# Patient Record
Sex: Female | Born: 1940 | Race: White | Hispanic: No | Marital: Married | Smoking: Never smoker
Health system: Southern US, Community
[De-identification: ages and names within clinical notes are randomized; demographics above are authoritative.]

## PROBLEM LIST (undated history)

## (undated) DIAGNOSIS — N3281 Overactive bladder: Secondary | ICD-10-CM

## (undated) DIAGNOSIS — E559 Vitamin D deficiency, unspecified: Secondary | ICD-10-CM

## (undated) DIAGNOSIS — M545 Low back pain, unspecified: Secondary | ICD-10-CM

## (undated) DIAGNOSIS — M170 Bilateral primary osteoarthritis of knee: Secondary | ICD-10-CM

## (undated) DIAGNOSIS — G8929 Other chronic pain: Secondary | ICD-10-CM

## (undated) DIAGNOSIS — D509 Iron deficiency anemia, unspecified: Secondary | ICD-10-CM

## (undated) DIAGNOSIS — N189 Chronic kidney disease, unspecified: Secondary | ICD-10-CM

## (undated) DIAGNOSIS — M810 Age-related osteoporosis without current pathological fracture: Secondary | ICD-10-CM

## (undated) DIAGNOSIS — D329 Benign neoplasm of meninges, unspecified: Secondary | ICD-10-CM

## (undated) DIAGNOSIS — E039 Hypothyroidism, unspecified: Secondary | ICD-10-CM

## (undated) DIAGNOSIS — I10 Essential (primary) hypertension: Secondary | ICD-10-CM

## (undated) DIAGNOSIS — E785 Hyperlipidemia, unspecified: Secondary | ICD-10-CM

## (undated) HISTORY — PX: EYE SURGERY: SHX253

## (undated) HISTORY — PX: APPENDECTOMY: SHX54

## (undated) HISTORY — PX: ABDOMINAL HYSTERECTOMY: SHX81

## (undated) HISTORY — PX: OTHER SURGICAL HISTORY: SHX169

## (undated) HISTORY — PX: BACK SURGERY: SHX140

---

## 1998-12-31 ENCOUNTER — Inpatient Hospital Stay (HOSPITAL_COMMUNITY): Admission: RE | Admit: 1998-12-31 | Discharge: 1999-01-04 | Payer: Self-pay | Admitting: Neurosurgery

## 1998-12-31 ENCOUNTER — Encounter: Payer: Self-pay | Admitting: Neurosurgery

## 1999-08-25 ENCOUNTER — Ambulatory Visit (HOSPITAL_COMMUNITY): Admission: RE | Admit: 1999-08-25 | Discharge: 1999-08-25 | Payer: Self-pay | Admitting: Neurosurgery

## 1999-08-25 ENCOUNTER — Encounter: Payer: Self-pay | Admitting: Neurosurgery

## 2004-05-16 ENCOUNTER — Other Ambulatory Visit: Payer: Self-pay

## 2005-06-08 ENCOUNTER — Ambulatory Visit: Payer: Self-pay | Admitting: *Deleted

## 2005-09-14 ENCOUNTER — Ambulatory Visit: Payer: Self-pay | Admitting: Family Medicine

## 2006-02-22 ENCOUNTER — Ambulatory Visit: Payer: Self-pay | Admitting: Unknown Physician Specialty

## 2006-03-30 ENCOUNTER — Ambulatory Visit: Payer: Self-pay | Admitting: Nephrology

## 2006-11-08 ENCOUNTER — Ambulatory Visit: Payer: Self-pay | Admitting: Family Medicine

## 2008-01-02 ENCOUNTER — Ambulatory Visit: Payer: Self-pay | Admitting: Family Medicine

## 2008-05-23 ENCOUNTER — Ambulatory Visit: Payer: Self-pay | Admitting: Internal Medicine

## 2009-01-07 ENCOUNTER — Ambulatory Visit: Payer: Self-pay | Admitting: Family Medicine

## 2010-01-19 ENCOUNTER — Ambulatory Visit: Payer: Self-pay | Admitting: Family Medicine

## 2010-02-17 ENCOUNTER — Ambulatory Visit: Payer: Self-pay | Admitting: Family Medicine

## 2011-06-08 ENCOUNTER — Ambulatory Visit: Payer: Self-pay | Admitting: Unknown Physician Specialty

## 2011-06-10 LAB — PATHOLOGY REPORT

## 2011-09-21 ENCOUNTER — Ambulatory Visit: Payer: Self-pay | Admitting: Internal Medicine

## 2012-03-15 ENCOUNTER — Ambulatory Visit: Payer: Self-pay | Admitting: Internal Medicine

## 2012-03-21 ENCOUNTER — Ambulatory Visit: Payer: Self-pay | Admitting: Internal Medicine

## 2012-03-29 ENCOUNTER — Ambulatory Visit: Payer: Self-pay | Admitting: Otolaryngology

## 2012-03-29 LAB — T4, FREE: Free Thyroxine: 0.67 ng/dL — ABNORMAL LOW (ref 0.76–1.46)

## 2012-03-29 LAB — TSH: Thyroid Stimulating Horm: 2.07 u[IU]/mL

## 2012-04-11 ENCOUNTER — Ambulatory Visit: Payer: Self-pay | Admitting: Otolaryngology

## 2012-05-30 ENCOUNTER — Ambulatory Visit: Payer: Self-pay | Admitting: Otolaryngology

## 2012-05-30 LAB — CALCIUM: Calcium, Total: 9.5 mg/dL (ref 8.5–10.1)

## 2012-06-01 LAB — PATHOLOGY REPORT

## 2013-11-15 ENCOUNTER — Ambulatory Visit: Payer: Self-pay | Admitting: Ophthalmology

## 2014-04-17 ENCOUNTER — Ambulatory Visit: Payer: Self-pay | Admitting: Internal Medicine

## 2014-05-14 ENCOUNTER — Ambulatory Visit: Payer: Self-pay | Admitting: Emergency Medicine

## 2014-07-18 ENCOUNTER — Ambulatory Visit: Payer: Self-pay | Admitting: Ophthalmology

## 2014-12-10 ENCOUNTER — Ambulatory Visit: Payer: Self-pay | Admitting: Family Medicine

## 2015-01-29 NOTE — Op Note (Signed)
PATIENT NAME:  Laura Bishop, Laura Bishop MR#:  [id number] DATE OF BIRTH:  September 29, 1941  DATE OF PROCEDURE:  05/30/2012  PREOPERATIVE DIAGNOSIS: Left thyroid mass.   POSTOPERATIVE DIAGNOSIS: Left thyroid mass.  PROCEDURE: Left hemithyroidectomy.  SURGEON: Margaretha Sheffield, MD  ASSISTANT: Mahlon Gammon, MD   COMPLICATIONS: None.   TOTAL ESTIMATED BLOOD LOSS: 50 mL.   DESCRIPTION OF PROCEDURE: The patient was given general anesthesia by oral endotracheal intubation. An oral endotracheal tube was placed that has monitors on it that can monitor the vocal cord movement throughout the procedure. This was visualized under direct vision to make sure it was placed properly. The patient's neck was extended some using a shoulder roll and the left thyroid mass was seen. As it pushed forward, the skin crease was marked and then the skin was prepped using 7 mL of 1% Xylocaine with epinephrine 1:100,000. The neck area was then prepped and draped in sterile fashion.   An incision was created through the skin and sub-Q to show the strap muscles. These were divided in the midline. The skin flap with platysma was retracted using dural hooks with the strap muscles divided in the midline and the left side was elevated over the top of a large left thyroid mass. The mass itself was about 6 cm in size. The lateral attachments were freed using the Harmonic scalpel. Some of the superior attachments were freed as well with the Harmonic scalpel. Then the inferior attachments were freed with the Harmonic scalpel as well. It was rotated more medially and as it came medially some of the posterior attachments were freed up. Using the Harmonic the lateral thyroid vein was cut with the Harmonic. The gland was then freed up digitally and was able to be rotated some and pulled into the anterior wound. As this was done, the posterior wall ruptured some and some of the insides of the thyroid all came out. The wound was irrigated copiously to make  sure there was nothing that was left there. The entire mass was wrapped with gauze to hold it and make sure nothing further came out. With it rotated several more posterior vessels were found and these were cut with the Harmonic scalpel. The recurrent laryngeal nerve was then found and it was sitting right where it normally sits. The stimulator verified position of it. Vessels overlying it and around it were cut with the Harmonic. The remaining superior attachments were freed up with the Harmonic. There was a superior parathyroid gland that was noted. This was left intact and in the bed. The attachments at [facility] ligament were then freed up and the entire left thyroid gland was then removed. This was all one large lump. This was marked and sent for permanent section. The right side was palpated. There were no specific nodules or abnormalities noted on the right side. The wound was copiously irrigated again multiple different times.   Surgicel was then placed into the thyroid bed and a 10 French TLS drain was placed through a separate stab incision inferiorly in the neck. The strap muscles were then closed in the midline loosely. The platysmal layer was then closed. This was with interrupted 4-0 Vicryls, the dermis was closed with interrupted 4-0 Vicryls, and then the skin edges were held in apposition with a 6-0 nylon suture. The wound was covered with bacitracin, Telfa, and Tegaderm dressing. The drain was taped to the skin. It was placed to continuous low Vacutainer pressure. The patient tolerated the procedure well. She was  awakened and taken to the recovery room in satisfactory condition. There were no operative complications. The nerve stimulated well.  ____________________________ Huey Romans, MD phj:slb D: 05/30/2012 11:26:21 ET T: 05/30/2012 12:16:08 ET JOB#: [id number]  cc: Huey Romans, MD, <Dictator> Huey Romans MD ELECTRONICALLY SIGNED 06/02/2012 8:28

## 2016-07-17 ENCOUNTER — Encounter: Payer: Self-pay | Admitting: *Deleted

## 2016-07-20 ENCOUNTER — Encounter: Payer: Self-pay | Admitting: *Deleted

## 2016-07-20 ENCOUNTER — Ambulatory Visit: Payer: Medicare Other | Admitting: Anesthesiology

## 2016-07-20 ENCOUNTER — Encounter
Admission: RE | Disposition: A | Discharge status: Home / Self Care | Payer: Self-pay | Source: Ambulatory Visit | Attending: Unknown Physician Specialty

## 2016-07-20 ENCOUNTER — Ambulatory Visit
Admission: RE | Admit: 2016-07-20 | Discharge: 2016-07-20 | Disposition: A | Discharge status: Home / Self Care | Payer: Medicare Other | Source: Ambulatory Visit | Attending: Unknown Physician Specialty | Admitting: Unknown Physician Specialty

## 2016-07-20 DIAGNOSIS — Z1211 Encounter for screening for malignant neoplasm of colon: Secondary | ICD-10-CM | POA: Diagnosis not present

## 2016-07-20 DIAGNOSIS — Z7982 Long term (current) use of aspirin: Secondary | ICD-10-CM | POA: Diagnosis not present

## 2016-07-20 DIAGNOSIS — D509 Iron deficiency anemia, unspecified: Secondary | ICD-10-CM | POA: Insufficient documentation

## 2016-07-20 DIAGNOSIS — G4730 Sleep apnea, unspecified: Secondary | ICD-10-CM | POA: Diagnosis not present

## 2016-07-20 DIAGNOSIS — K529 Noninfective gastroenteritis and colitis, unspecified: Secondary | ICD-10-CM | POA: Insufficient documentation

## 2016-07-20 DIAGNOSIS — E039 Hypothyroidism, unspecified: Secondary | ICD-10-CM | POA: Insufficient documentation

## 2016-07-20 DIAGNOSIS — Z8719 Personal history of other diseases of the digestive system: Secondary | ICD-10-CM | POA: Diagnosis not present

## 2016-07-20 DIAGNOSIS — D123 Benign neoplasm of transverse colon: Secondary | ICD-10-CM | POA: Diagnosis not present

## 2016-07-20 DIAGNOSIS — E669 Obesity, unspecified: Secondary | ICD-10-CM | POA: Insufficient documentation

## 2016-07-20 DIAGNOSIS — K210 Gastro-esophageal reflux disease with esophagitis: Secondary | ICD-10-CM | POA: Insufficient documentation

## 2016-07-20 DIAGNOSIS — Z6834 Body mass index (BMI) 34.0-34.9, adult: Secondary | ICD-10-CM | POA: Insufficient documentation

## 2016-07-20 DIAGNOSIS — D122 Benign neoplasm of ascending colon: Secondary | ICD-10-CM | POA: Insufficient documentation

## 2016-07-20 DIAGNOSIS — I10 Essential (primary) hypertension: Secondary | ICD-10-CM | POA: Insufficient documentation

## 2016-07-20 DIAGNOSIS — Z79899 Other long term (current) drug therapy: Secondary | ICD-10-CM | POA: Diagnosis not present

## 2016-07-20 DIAGNOSIS — E785 Hyperlipidemia, unspecified: Secondary | ICD-10-CM | POA: Insufficient documentation

## 2016-07-20 DIAGNOSIS — D125 Benign neoplasm of sigmoid colon: Secondary | ICD-10-CM | POA: Insufficient documentation

## 2016-07-20 DIAGNOSIS — R1310 Dysphagia, unspecified: Secondary | ICD-10-CM | POA: Diagnosis not present

## 2016-07-20 DIAGNOSIS — M170 Bilateral primary osteoarthritis of knee: Secondary | ICD-10-CM | POA: Insufficient documentation

## 2016-07-20 HISTORY — DX: Low back pain, unspecified: M54.50

## 2016-07-20 HISTORY — DX: Vitamin D deficiency, unspecified: E55.9

## 2016-07-20 HISTORY — DX: Essential (primary) hypertension: I10

## 2016-07-20 HISTORY — PX: ESOPHAGOGASTRODUODENOSCOPY (EGD) WITH PROPOFOL: SHX5813

## 2016-07-20 HISTORY — DX: Hyperlipidemia, unspecified: E78.5

## 2016-07-20 HISTORY — DX: Low back pain: M54.5

## 2016-07-20 HISTORY — DX: Hypothyroidism, unspecified: E03.9

## 2016-07-20 HISTORY — DX: Overactive bladder: N32.81

## 2016-07-20 HISTORY — PX: COLONOSCOPY WITH PROPOFOL: SHX5780

## 2016-07-20 HISTORY — DX: Iron deficiency anemia, unspecified: D50.9

## 2016-07-20 HISTORY — DX: Bilateral primary osteoarthritis of knee: M17.0

## 2016-07-20 HISTORY — DX: Other chronic pain: G89.29

## 2016-07-20 HISTORY — DX: Age-related osteoporosis without current pathological fracture: M81.0

## 2016-07-20 SURGERY — ESOPHAGOGASTRODUODENOSCOPY (EGD) WITH PROPOFOL
Anesthesia: General

## 2016-07-20 MED ORDER — KETOROLAC TROMETHAMINE 30 MG/ML IJ SOLN: INTRAMUSCULAR | Status: DC | PRN

## 2016-07-20 MED ORDER — SODIUM CHLORIDE 0.9 % IV SOLN
INTRAVENOUS | Status: DC
  Administered 2016-07-20: 10:00:00 via INTRAVENOUS

## 2016-07-20 MED ORDER — LIDOCAINE HCL (CARDIAC) 20 MG/ML IV SOLN
INTRAVENOUS | Status: DC | PRN
Start: 2016-07-20 — End: 2016-07-20
  Administered 2016-07-20: 30 mg via INTRAVENOUS

## 2016-07-20 MED ORDER — FENTANYL CITRATE (PF) 100 MCG/2ML IJ SOLN
INTRAMUSCULAR | Status: DC | PRN
  Administered 2016-07-20: 50 ug via INTRAVENOUS

## 2016-07-20 MED ORDER — EPHEDRINE SULFATE 50 MG/ML IJ SOLN
INTRAMUSCULAR | Status: DC | PRN
  Administered 2016-07-20: 10 mg via INTRAVENOUS

## 2016-07-20 MED ORDER — PROPOFOL 500 MG/50ML IV EMUL
INTRAVENOUS | Status: DC | PRN
  Administered 2016-07-20: 100 ug/kg/min via INTRAVENOUS

## 2016-07-20 MED ORDER — SODIUM CHLORIDE 0.9 % IV SOLN: INTRAVENOUS | Status: DC

## 2016-07-20 MED ORDER — MIDAZOLAM HCL 2 MG/2ML IJ SOLN
INTRAMUSCULAR | Status: DC | PRN
  Administered 2016-07-20: 1 mg via INTRAVENOUS

## 2016-07-20 NOTE — Anesthesia Preprocedure Evaluation (Signed)
Anesthesia Evaluation  Patient identified by MRN, date of birth, ID band Patient awake    Reviewed: Allergy & Precautions, NPO status , Patient's Chart, lab work & pertinent test results  History of Anesthesia Complications Negative for: history of anesthetic complications  Airway Mallampati: II  TM Distance: >3 FB Neck ROM: Full    Dental  (+) Upper Dentures   Pulmonary neg pulmonary ROS, neg sleep apnea, neg COPD,    breath sounds clear to auscultation- rhonchi (-) wheezing      Cardiovascular hypertension, Pt. on medications (-) CAD and (-) Cardiac Stents  Rhythm:Regular Rate:Normal - Systolic murmurs and - Diastolic murmurs    Neuro/Psych negative psych ROS   GI/Hepatic negative GI ROS, Neg liver ROS,   Endo/Other  neg diabetesHypothyroidism   Renal/GU negative Renal ROS     Musculoskeletal  (+) Arthritis , Osteoarthritis,    Abdominal (+) + obese,   Peds  Hematology  (+) anemia ,   Anesthesia Other Findings Past Medical History: No date: Chronic lower back pain No date: Hyperlipidemia No date: Hypertension No date: Hypothyroidism No date: IDA (iron deficiency anemia) No date: Osteoarthritis of both knees No date: Osteoporosis No date: Overactive bladder No date: Vitamin D deficiency   Reproductive/Obstetrics                            Anesthesia Physical Anesthesia Plan  ASA: III  Anesthesia Plan: General   Post-op Pain Management:    Induction: Intravenous  Airway Management Planned: Natural Airway  Additional Equipment:   Intra-op Plan:   Post-operative Plan:   Informed Consent: I have reviewed the patients History and Physical, chart, labs and discussed the procedure including the risks, benefits and alternatives for the proposed anesthesia with the patient or authorized representative who has indicated his/her understanding and acceptance.   Dental advisory  given  Plan Discussed with: CRNA and Anesthesiologist  Anesthesia Plan Comments:         Anesthesia Quick Evaluation

## 2016-07-20 NOTE — Op Note (Signed)
[facility] Patient Name: Laura Bishop Procedure Date: 07/20/2016 10:42 AM MRN: [id number] Account #: [id number] Date of Birth: 1941/03/18 Admit Type: Outpatient Age: 75 Room: [facility] ENDO ROOM 3 Gender: Female Note Status: Finalized Procedure:            Upper GI endoscopy Indications:          Dysphagia Providers:            Manya Silvas, MD Referring MD:         Christena Flake. Raechel Ache, MD (Referring MD) Medicines:            Propofol per Anesthesia Complications:        No immediate complications. Procedure:            Pre-Anesthesia Assessment:                       - After reviewing the risks and benefits, the patient                        was deemed in satisfactory condition to undergo the                        procedure.                       After obtaining informed consent, the endoscope was                        passed under direct vision. Throughout the procedure,                        the patient's blood pressure, pulse, and oxygen                        saturations were monitored continuously. The Endoscope                        was introduced through the mouth, and advanced to the                        second part of duodenum. The upper GI endoscopy was                        accomplished without difficulty. The patient tolerated                        the procedure well. Findings:      LA Grade A (one or more mucosal breaks less than 5 mm, not extending       between tops of 2 mucosal folds) esophagitis with no bleeding was found       39 cm from the incisors. Biopsies were taken with a cold forceps for       histology. After the exam I passed a guide wire (and removed the scope)       over the wire with dilatation with a 48 F Savary dilator.      The stomach was normal.      The examined duodenum was normal. Impression:           - LA Grade A reflux esophagitis. Rule out Barrett's  esophagus. Biopsied.                      - Normal stomach.                       - Normal examined duodenum. Recommendation:       - soft food for 3 days, eat slowly, chew well, take                        small bites Manya Silvas, MD 07/20/2016 10:56:27 AM This report has been signed electronically. Number of Addenda: 0 Note Initiated On: 07/20/2016 10:42 AM      [facility]

## 2016-07-20 NOTE — Anesthesia Postprocedure Evaluation (Signed)
Anesthesia Post Note  Patient: Laura Bishop  Procedure(s) Performed: Procedure(s) (LRB): ESOPHAGOGASTRODUODENOSCOPY (EGD) WITH PROPOFOL (N/A) COLONOSCOPY WITH PROPOFOL (N/A)  Patient location during evaluation: Endoscopy Anesthesia Type: General Level of consciousness: oriented and awake and alert Pain management: pain level controlled Vital Signs Assessment: post-procedure vital signs reviewed and stable Respiratory status: spontaneous breathing, nonlabored ventilation and respiratory function stable Cardiovascular status: blood pressure returned to baseline and stable Postop Assessment: no signs of nausea or vomiting Anesthetic complications: no    Last Vitals:  Vitals:   07/20/16 1154 07/20/16 1204  BP: (!) 128/42 137/68  Pulse: 63 (!) 55  Resp: 13 19  Temp:      Last Pain:  Vitals:   07/20/16 1144  TempSrc: Tympanic                 [name]

## 2016-07-20 NOTE — Op Note (Signed)
[facility] Patient Name: Laura Bishop Procedure Date: 07/20/2016 10:41 AM MRN: [id number] Account #: [id number] Date of Birth: 1941-06-26 Admit Type: Outpatient Age: 75 Room: [facility] ENDO ROOM 3 Gender: Female Note Status: Finalized Procedure:            Colonoscopy Indications:          High risk colon cancer surveillance: Personal history                        of colonic polyps Providers:            Manya Silvas, MD Referring MD:         Christena Flake. Raechel Ache, MD (Referring MD) Medicines:            Propofol per Anesthesia Complications:        No immediate complications. Procedure:            Pre-Anesthesia Assessment:                       - After reviewing the risks and benefits, the patient                        was deemed in satisfactory condition to undergo the                        procedure.                       After obtaining informed consent, the colonoscope was                        passed under direct vision. Throughout the procedure,                        the patient's blood pressure, pulse, and oxygen                        saturations were monitored continuously. The                        Colonoscope was introduced through the anus and                        advanced to the the cecum, identified by appendiceal                        orifice and ileocecal valve. The colonoscopy was                        performed without difficulty. The patient tolerated the                        procedure well. The quality of the bowel preparation                        was good. Findings:      A few sessile polyps- 8- were found in the cecum. The polyps were       diminutive in size. These polyps were removed with a jumbo cold forceps.       Resection and retrieval were complete.      A  small polyp was found in the ascending colon. The polyp was sessile.       The polyp was removed with a hot snare. Resection and retrieval were   complete.      A diminutive polyp was found in the splenic flexure. The polyp was       sessile. The polyp was removed with a jumbo cold forceps. Resection and       retrieval were complete.      A diminutive polyp was found in the splenic flexure. The polyp was       sessile. The polyp was removed with a cold snare. Resection and       retrieval were complete.      A diminutive polyp was found in the sigmoid colon. The polyp was       sessile. The polyp was removed with a cold snare. Resection and       retrieval were complete.      A diminutive polyp was found in the descending colon. The polyp was       sessile. The polyp was removed with a hot snare. Resection and retrieval       were complete.      The exam was otherwise without abnormality. Impression:           - A few diminutive polyps in the cecum, removed with a                        jumbo cold forceps. Resected and retrieved.                       - One small polyp in the ascending colon, removed with                        a hot snare. Resected and retrieved.                       - One diminutive polyp at the splenic flexure, removed                        with a jumbo cold forceps. Resected and retrieved.                       - One diminutive polyp at the splenic flexure, removed                        with a cold snare. Resected and retrieved.                       - One diminutive polyp in the sigmoid colon, removed                        with a cold snare. Resected and retrieved.                       - One diminutive polyp in the descending colon, removed                        with a hot snare. Resected and retrieved.                       - The examination was  otherwise normal. Recommendation:       - Await pathology results. Manya Silvas, MD 07/20/2016 11:45:54 AM This report has been signed electronically. Number of Addenda: 0 Note Initiated On: 07/20/2016 10:41 AM Scope Withdrawal Time: 0 hours 17 minutes  32 seconds  Total Procedure Duration: 0 hours 39 minutes 4 seconds       [facility]

## 2016-07-20 NOTE — H&P (Signed)
Primary Care Physician:  Ezequiel Kayser, MD Primary Gastroenterologist:  Dr. Vira Agar  Pre-Procedure History & Physical: HPI:  Laura Bishop is a 75 y.o. female is here for an endoscopy and colonoscopy.   Past Medical History:  Diagnosis Date  . Chronic lower back pain   . Hyperlipidemia   . Hypertension   . Hypothyroidism   . IDA (iron deficiency anemia)   . Osteoarthritis of both knees   . Osteoporosis   . Overactive bladder   . Vitamin D deficiency     Past Surgical History:  Procedure Laterality Date  . ABDOMINAL HYSTERECTOMY    . BACK SURGERY      Prior to Admission medications   Medication Sig Start Date End Date Taking? Authorizing Provider  amLODipine (NORVASC) 5 MG tablet Take 5 mg by mouth daily.   Yes Historical Provider, MD  aspirin 81 MG chewable tablet Chew by mouth daily.   Yes Historical Provider, MD  calcium carbonate (OSCAL) 1500 (600 Ca) MG TABS tablet Take by mouth daily with breakfast.   Yes Historical Provider, MD  ferrous sulfate 325 (65 FE) MG EC tablet Take 325 mg by mouth daily.   Yes Historical Provider, MD  levothyroxine (SYNTHROID, LEVOTHROID) 75 MCG tablet Take 75 mcg by mouth daily before breakfast.   Yes Historical Provider, MD  metoprolol tartrate (LOPRESSOR) 25 MG tablet Take 25 mg by mouth 2 (two) times daily.   Yes Historical Provider, MD  Multiple Vitamin (MULTIVITAMIN) tablet Take 1 tablet by mouth daily.   Yes Historical Provider, MD  oxybutynin (DITROPAN) 5 MG tablet Take 5 mg by mouth 2 (two) times daily.   Yes Historical Provider, MD  pravastatin (PRAVACHOL) 80 MG tablet Take 80 mg by mouth daily.   Yes Historical Provider, MD  ranitidine (ZANTAC) 75 MG tablet Take 75 mg by mouth 2 (two) times daily.   Yes Historical Provider, MD    Allergies as of 05/29/2016  . (Not on File)    History reviewed. No pertinent family history.  Social History   Social History  . Marital status: Married    Spouse name: N/A  . Number of  children: N/A  . Years of education: N/A   Occupational History  . Not on file.   Social History Main Topics  . Smoking status: Never Smoker  . Smokeless tobacco: Never Used  . Alcohol use No  . Drug use: No  . Sexual activity: Not on file   Other Topics Concern  . Not on file   Social History Narrative  . No narrative on file    Review of Systems: See HPI, otherwise negative ROS  Physical Exam: BP (!) 161/75   Pulse (!) 48   Temp 97.8 F (36.6 C) (Tympanic)   Resp 18   Ht 5' (1.524 m)   Wt 79.4 kg (175 lb)   SpO2 100%   BMI 34.18 kg/m  General:   Alert,  pleasant and cooperative in NAD Head:  Normocephalic and atraumatic. Neck:  Supple; no masses or thyromegaly. Lungs:  Clear throughout to auscultation.    Heart:  Regular rate and rhythm. Abdomen:  Soft, nontender and nondistended. Normal bowel sounds, without guarding, and without rebound.   Neurologic:  Alert and  oriented x4;  grossly normal neurologically.  Impression/Plan: [name] is here for an endoscopy and colonoscopy to be performed for [facility] colon polyps and dysphagia  Risks, benefits, limitations, and alternatives regarding  endoscopy and colonoscopy have been  reviewed with the patient.  Questions have been answered.  All parties agreeable.   Gaylyn Cheers, MD  07/20/2016, 10:37 AM

## 2016-07-20 NOTE — Transfer of Care (Signed)
Immediate Anesthesia Transfer of Care Note  Patient: Laura Bishop  Procedure(s) Performed: Procedure(s): ESOPHAGOGASTRODUODENOSCOPY (EGD) WITH PROPOFOL (N/A) COLONOSCOPY WITH PROPOFOL (N/A)  Patient Location: PACU  Anesthesia Type:General  Level of Consciousness: awake and sedated  Airway & Oxygen Therapy: Patient Spontanous Breathing and Patient connected to nasal cannula oxygen  Post-op Assessment: Report given to RN and Post -op Vital signs reviewed and stable  Post vital signs: Reviewed and stable  Last Vitals:  Vitals:   07/20/16 1004  BP: (!) 161/75  Pulse: (!) 48  Resp: 18  Temp: 36.6 C    Last Pain:  Vitals:   07/20/16 1004  TempSrc: Tympanic         Complications: No apparent anesthesia complications

## 2016-07-20 NOTE — Anesthesia Procedure Notes (Signed)
Performed by: COOK-MARTIN, Almetta Liddicoat Pre-anesthesia Checklist: Patient identified, Emergency Drugs available, Suction available, Patient being monitored and Timeout performed Patient Re-evaluated:Patient Re-evaluated prior to inductionOxygen Delivery Method: Nasal cannula Preoxygenation: Pre-oxygenation with 100% oxygen Intubation Type: IV induction Airway Equipment and Method: Bite block Placement Confirmation: CO2 detector and positive ETCO2     

## 2016-07-21 ENCOUNTER — Encounter: Payer: Self-pay | Admitting: Unknown Physician Specialty

## 2016-07-22 LAB — SURGICAL PATHOLOGY

## 2016-08-06 ENCOUNTER — Ambulatory Visit
Admission: EM | Admit: 2016-08-06 | Discharge: 2016-08-06 | Disposition: A | Discharge status: Home / Self Care | Payer: Medicare Other | Attending: Family Medicine | Admitting: Family Medicine

## 2016-08-06 DIAGNOSIS — H6011 Cellulitis of right external ear: Secondary | ICD-10-CM | POA: Diagnosis not present

## 2016-08-06 DIAGNOSIS — H9201 Otalgia, right ear: Secondary | ICD-10-CM

## 2016-08-06 MED ORDER — MUPIROCIN 2 % EX OINT: 1.0000 "application " | TOPICAL_OINTMENT | Freq: Three times a day (TID) | CUTANEOUS | 0 refills | Status: DC

## 2016-08-06 MED ORDER — SULFAMETHOXAZOLE-TRIMETHOPRIM 800-160 MG PO TABS: 1.0000 | ORAL_TABLET | Freq: Two times a day (BID) | ORAL | 0 refills | Status: DC

## 2016-08-06 NOTE — ED Triage Notes (Addendum)
Pt c/o of ear pain on the inside and outside of her right ear. She mentions it is hard to hear. Right ear does appear to be swollen and red on the outside and she states it feels funny to touch.

## 2016-08-06 NOTE — ED Provider Notes (Signed)
[facility]    CSN: [id number] Arrival date & time: 08/06/16  1117     History   Chief Complaint Chief Complaint  Patient presents with  . Otalgia    HPI Laura Bishop is a 75 y.o. female.   Patient's here because of swollen right ear. Patient is not the best historian. States got worse yesterday but apparently has been a problem for several days before that. She does not know or cannot remember being bitten by insect. She states that she's had trouble with this ear becoming affect full. She denies any pain inside the ear. She's had neck surgery and goiter removed. She has chronic low back pain hyperlipidemia hypertension and hypothyroidism. She denies any family medical history-significant to today's visit. She's not allergic to any medications and she denies smoking. 2 tobacco using type of illicit drugs.   The history is provided by the patient. No language interpreter was used.  Otalgia  Location:  Right Behind ear:  Redness and swelling Quality:  Aching and shooting Onset quality:  Unable to specify Progression:  Worsening Chronicity:  New Context: not direct blow, not elevation change, not foreign body in ear, not loud noise, not recent URI and not water in ear   Relieved by:  None tried Worsened by:  Nothing Associated symptoms: no abdominal pain, no cough, no ear discharge, no fever, no headaches, no rhinorrhea and no sore throat   Risk factors: no chronic ear infection and no prior ear surgery     Past Medical History:  Diagnosis Date  . Chronic lower back pain   . Hyperlipidemia   . Hypertension   . Hypothyroidism   . IDA (iron deficiency anemia)   . Osteoarthritis of both knees   . Osteoporosis   . Overactive bladder   . Vitamin D deficiency     There are no active problems to display for this patient.   Past Surgical History:  Procedure Laterality Date  . ABDOMINAL HYSTERECTOMY    . BACK SURGERY    . COLONOSCOPY WITH PROPOFOL N/A  07/20/2016   Procedure: COLONOSCOPY WITH PROPOFOL;  Surgeon: Manya Silvas, MD;  Location: [facility] ENDOSCOPY;  Service: Endoscopy;  Laterality: N/A;  . ESOPHAGOGASTRODUODENOSCOPY (EGD) WITH PROPOFOL N/A 07/20/2016   Procedure: ESOPHAGOGASTRODUODENOSCOPY (EGD) WITH PROPOFOL;  Surgeon: Manya Silvas, MD;  Location: [facility] ENDOSCOPY;  Service: Endoscopy;  Laterality: N/A;    OB History    No data available       Home Medications    Prior to Admission medications   Medication Sig Start Date End Date Taking? Authorizing Provider  amLODipine (NORVASC) 5 MG tablet Take 5 mg by mouth daily.   Yes Historical Provider, MD  aspirin 81 MG chewable tablet Chew by mouth daily.   Yes Historical Provider, MD  calcium carbonate (OSCAL) 1500 (600 Ca) MG TABS tablet Take by mouth daily with breakfast.   Yes Historical Provider, MD  ferrous sulfate 325 (65 FE) MG EC tablet Take 325 mg by mouth daily.   Yes Historical Provider, MD  levothyroxine (SYNTHROID, LEVOTHROID) 75 MCG tablet Take 75 mcg by mouth daily before breakfast.   Yes Historical Provider, MD  metoprolol tartrate (LOPRESSOR) 25 MG tablet Take 25 mg by mouth 2 (two) times daily.   Yes Historical Provider, MD  Multiple Vitamin (MULTIVITAMIN) tablet Take 1 tablet by mouth daily.   Yes Historical Provider, MD  oxybutynin (DITROPAN) 5 MG tablet Take 5 mg by mouth 2 (two) times daily.  Yes Historical Provider, MD  pravastatin (PRAVACHOL) 80 MG tablet Take 80 mg by mouth daily.   Yes Historical Provider, MD  ranitidine (ZANTAC) 75 MG tablet Take 75 mg by mouth 2 (two) times daily.   Yes Historical Provider, MD  mupirocin ointment (BACTROBAN) 2 % Apply 1 application topically 3 (three) times daily. 08/06/16   Frederich Cha, MD  sulfamethoxazole-trimethoprim (BACTRIM DS,SEPTRA DS) 800-160 MG tablet Take 1 tablet by mouth 2 (two) times daily. 08/06/16   Frederich Cha, MD    Family History History reviewed. No pertinent family history.  Social  History Social History  Substance Use Topics  . Smoking status: Never Smoker  . Smokeless tobacco: Never Used  . Alcohol use No     Allergies   Estrogens and Nsaids   Review of Systems Review of Systems  Constitutional: Negative for fever.  HENT: Positive for ear pain. Negative for ear discharge, rhinorrhea and sore throat.   Respiratory: Negative for cough.   Gastrointestinal: Negative for abdominal pain.  Neurological: Negative for headaches.  All other systems reviewed and are negative.    Physical Exam Triage Vital Signs ED Triage Vitals  Enc Vitals Group     BP 08/06/16 1135 (!) 144/76     Pulse Rate 08/06/16 1135 (!) 58     Resp 08/06/16 1135 18     Temp 08/06/16 1135 98.2 F (36.8 C)     Temp Source 08/06/16 1135 Oral     SpO2 08/06/16 1135 100 %     Weight 08/06/16 1134 175 lb (79.4 kg)     Height 08/06/16 1134 5' (1.524 m)     Head Circumference --      Peak Flow --      Pain Score 08/06/16 1135 5     Pain Loc --      Pain Edu? --      Excl. in [place]? --    No data found.   Updated Vital Signs BP (!) 144/76 (BP Location: Right Arm)   Pulse (!) 58   Temp 98.2 F (36.8 C) (Oral)   Resp 18   Ht 5' (1.524 m)   Wt 175 lb (79.4 kg)   SpO2 100%   BMI 34.18 kg/m   Visual Acuity Right Eye Distance:   Left Eye Distance:   Bilateral Distance:    Right Eye Near:   Left Eye Near:    Bilateral Near:     Physical Exam  Constitutional: She is oriented to person, place, and time. She appears well-developed and well-nourished. No distress.  HENT:  Head: Normocephalic and atraumatic.  Right Ear: Hearing and tympanic membrane normal. There is tenderness. A foreign body is present. Tympanic membrane is not erythematous.  Left Ear: Hearing, tympanic membrane and external ear normal. A foreign body is present.  Nose: Nose normal.  Mouth/Throat: Uvula is midline and oropharynx is clear and moist. She has dentures. No uvula swelling.  Patient's left ear is  swollen and small break in the skin would be a insect bite is difficult to say  Eyes: Pupils are equal, round, and reactive to light.  Neck: Normal range of motion. Neck supple.  Pulmonary/Chest: Effort normal.  Musculoskeletal: Normal range of motion. She exhibits no edema or deformity.  Neurological: She is alert and oriented to person, place, and time. She has normal reflexes.  Skin: Skin is warm.  Psychiatric: She has a normal mood and affect.  Vitals reviewed.    UC Treatments / Results  Labs (all labs ordered are listed, but only abnormal results are displayed) Labs Reviewed - No data to display  EKG  EKG Interpretation None       Radiology No results found.  Procedures Procedures (including critical care time)  Medications Ordered in UC Medications - No data to display   Initial Impression / Assessment and Plan / UC Course  I have reviewed the triage vital signs and the nursing notes.  Pertinent labs & imaging results that were available during my care of the patient were reviewed by me and considered in my medical decision making (see chart for details).  Clinical Course    Right otitis externa maybe some insect bite. We'll place on Septra DS 1 tablet twice a day and Bactroban ointment to apply to the wound 2-3 times a day. Follow-up PCP in a week if not better  Final Clinical Impressions(s) / UC Diagnoses   Final diagnoses:  Otalgia of right ear  Cellulitis of auricle of right ear    New Prescriptions New Prescriptions   MUPIROCIN OINTMENT (BACTROBAN) 2 %    Apply 1 application topically 3 (three) times daily.   SULFAMETHOXAZOLE-TRIMETHOPRIM (BACTRIM DS,SEPTRA DS) 800-160 MG TABLET    Take 1 tablet by mouth 2 (two) times daily.     Frederich Cha, MD 08/06/16 1248

## 2016-08-09 ENCOUNTER — Telehealth: Payer: Self-pay | Admitting: *Deleted

## 2016-08-09 NOTE — Telephone Encounter (Signed)
Courtesy call back, verified DOB, patient reported feeling better.

## 2016-10-09 ENCOUNTER — Other Ambulatory Visit: Payer: Self-pay | Admitting: Internal Medicine

## 2016-10-09 DIAGNOSIS — Z1231 Encounter for screening mammogram for malignant neoplasm of breast: Secondary | ICD-10-CM

## 2016-11-04 ENCOUNTER — Ambulatory Visit
Admission: RE | Admit: 2016-11-04 | Discharge: 2016-11-04 | Disposition: A | Discharge status: Home / Self Care | Payer: Medicare HMO | Source: Ambulatory Visit | Attending: Internal Medicine | Admitting: Internal Medicine

## 2016-11-04 DIAGNOSIS — Z1231 Encounter for screening mammogram for malignant neoplasm of breast: Secondary | ICD-10-CM | POA: Insufficient documentation

## 2017-02-26 ENCOUNTER — Encounter: Payer: Self-pay | Admitting: *Deleted

## 2017-02-26 ENCOUNTER — Ambulatory Visit
Admission: EM | Admit: 2017-02-26 | Discharge: 2017-02-26 | Disposition: A | Discharge status: Home / Self Care | Payer: Medicare HMO | Attending: Family Medicine | Admitting: Family Medicine

## 2017-02-26 DIAGNOSIS — M5412 Radiculopathy, cervical region: Secondary | ICD-10-CM

## 2017-02-26 MED ORDER — METHYLPREDNISOLONE 4 MG PO TBPK: ORAL_TABLET | ORAL | 0 refills | Status: DC

## 2017-02-26 NOTE — ED Triage Notes (Signed)
Patient started having left upper arm pain 1 week ago. Mechanism of injury unknown. No previous history of left upper arm pain.

## 2017-02-26 NOTE — ED Provider Notes (Signed)
CSN: [id number]     Arrival date & time 02/26/17  20 History   First MD Initiated Contact with Patient 02/26/17 1147     Chief Complaint  Patient presents with  . Arm Pain   (Consider location/radiation/quality/duration/timing/severity/associated sxs/prior Treatment) HPI  This a 76 year old female who presents with left upper arm pain that she noticed one week ago. With further questioning she does remember 2 weeks prior to that of having neck pain that was radiating into her left trapezius muscle. The trapezial muscle continues to be tender. She has not had pain with shoulder range of motion. His had no numbness or tingling into her hand. Does not remember any specific incident which may have precipitated her neck pain. She says that she is not able to take nonsteroidal anti-medication because it does not agree with her. Instead she has been using extra strength Tylenol which does not seem to be helping.        Past Medical History:  Diagnosis Date  . Chronic lower back pain   . Hyperlipidemia   . Hypertension   . Hypothyroidism   . IDA (iron deficiency anemia)   . Osteoarthritis of both knees   . Osteoporosis   . Overactive bladder   . Vitamin D deficiency    Past Surgical History:  Procedure Laterality Date  . ABDOMINAL HYSTERECTOMY    . BACK SURGERY    . COLONOSCOPY WITH PROPOFOL N/A 07/20/2016   Procedure: COLONOSCOPY WITH PROPOFOL;  Surgeon: Manya Silvas, MD;  Location: [facility] ENDOSCOPY;  Service: Endoscopy;  Laterality: N/A;  . ESOPHAGOGASTRODUODENOSCOPY (EGD) WITH PROPOFOL N/A 07/20/2016   Procedure: ESOPHAGOGASTRODUODENOSCOPY (EGD) WITH PROPOFOL;  Surgeon: Manya Silvas, MD;  Location: [facility] ENDOSCOPY;  Service: Endoscopy;  Laterality: N/A;   History reviewed. No pertinent family history. Social History  Substance Use Topics  . Smoking status: Never Smoker  . Smokeless tobacco: Never Used  . Alcohol use No   OB History    No data available     Review of  Systems  Constitutional: Positive for activity change. Negative for appetite change, chills, fatigue and fever.  Musculoskeletal: Positive for neck pain and neck stiffness.  All other systems reviewed and are negative.   Allergies  Estrogens and Nsaids  Home Medications   Prior to Admission medications   Medication Sig Start Date End Date Taking? Authorizing Provider  amLODipine (NORVASC) 5 MG tablet Take 5 mg by mouth daily.   Yes [provider]  aspirin 81 MG chewable tablet Chew by mouth daily.   Yes [provider]  calcium carbonate (OSCAL) 1500 (600 Ca) MG TABS tablet Take by mouth daily with breakfast.   Yes [provider]  ferrous sulfate 325 (65 FE) MG EC tablet Take 325 mg by mouth daily.   Yes [provider]  levothyroxine (SYNTHROID, LEVOTHROID) 75 MCG tablet Take 75 mcg by mouth daily before breakfast.   Yes [provider]  metoprolol tartrate (LOPRESSOR) 25 MG tablet Take 25 mg by mouth 2 (two) times daily.   Yes [provider]  Multiple Vitamin (MULTIVITAMIN) tablet Take 1 tablet by mouth daily.   Yes [provider]  mupirocin ointment (BACTROBAN) 2 % Apply 1 application topically 3 (three) times daily. 08/06/16  Yes Frederich Cha, MD  oxybutynin (DITROPAN) 5 MG tablet Take 5 mg by mouth 2 (two) times daily.   Yes [provider]  pravastatin (PRAVACHOL) 80 MG tablet Take 80 mg by mouth daily.   Yes  [provider]  ranitidine (ZANTAC) 75 MG tablet Take 75 mg by mouth 2 (two) times daily.   Yes [provider]  methylPREDNISolone (MEDROL DOSEPAK) 4 MG TBPK tablet Take per package instructions 02/26/17   Lorin Picket, PA-C  sulfamethoxazole-trimethoprim (BACTRIM DS,SEPTRA DS) 800-160 MG tablet Take 1 tablet by mouth 2 (two) times daily. 08/06/16   Frederich Cha, MD   Meds Ordered and Administered this Visit  Medications - No data to display  BP (!) 152/76 (BP Location: Right  Arm)   Pulse (!) 50   Temp 98.4 F (36.9 C) (Oral)   Resp 16   Ht 5' (1.524 m)   Wt 175 lb (79.4 kg)   SpO2 97%   BMI 34.18 kg/m  No data found.   Physical Exam  Constitutional: She is oriented to person, place, and time. She appears well-developed and well-nourished. No distress.  Eyes: Pupils are equal, round, and reactive to light.  Neck:  Examination of the neck shows a decreased range of motion of the C-spine that discomfort at the extremes of full extension left lateral flexion and extension and rotation bilaterally. She has tenderness and spasm palpable in the left trapezial muscle. Sensation is intact to light touch throughout the upper extremities. Strength is intact to clinical testing. DTRs are 2+ over 4 and symmetrical. Shoulder range of motion is full and comfortable. She has a negative empty can test. She is a negative drop arm test.  Musculoskeletal: Normal range of motion.  Neurological: She is alert and oriented to person, place, and time.  Skin: Skin is warm and dry. She is not diaphoretic.  Psychiatric: She has a normal mood and affect. Her behavior is normal. Judgment and thought content normal.  Nursing note and vitals reviewed.   Urgent Care Course     Procedures (including critical care time)  Labs Review Labs Reviewed - No data to display  Imaging Review No results found.   Visual Acuity Review  Right Eye Distance:   Left Eye Distance:   Bilateral Distance:    Right Eye Near:   Left Eye Near:    Bilateral Near:         MDM   1. Cervical radiculopathy    Discharge Medication List as of 02/26/2017 12:11 PM    START taking these medications   Details  methylPREDNISolone (MEDROL DOSEPAK) 4 MG TBPK tablet Take per package instructions, Normal      Plan: 1. Test/x-ray results and diagnosis reviewed with patient 2. rx as per orders; risks, benefits, potential side effects reviewed with patient 3. Recommend supportive treatment with Rest  and symptom avoidance. Since she is unable to tolerate nonsteroidals I will start her on a Medrol Dosepak. I recommended that she follow-up with her primary care physician next week. She may use heat or ice 20 minutes out of every 2 hours 3-4 times daily. 4. F/u prn if symptoms worsen or don't improve     Lorin Picket, PA-C 02/26/17 1230

## 2017-10-21 ENCOUNTER — Other Ambulatory Visit: Payer: Self-pay | Admitting: Internal Medicine

## 2017-10-21 DIAGNOSIS — Z1231 Encounter for screening mammogram for malignant neoplasm of breast: Secondary | ICD-10-CM

## 2017-11-08 ENCOUNTER — Ambulatory Visit
Admission: RE | Admit: 2017-11-08 | Discharge: 2017-11-08 | Disposition: A | Discharge status: Home / Self Care | Payer: Medicare HMO | Source: Ambulatory Visit | Attending: Internal Medicine | Admitting: Internal Medicine

## 2017-11-08 DIAGNOSIS — Z1231 Encounter for screening mammogram for malignant neoplasm of breast: Secondary | ICD-10-CM | POA: Insufficient documentation

## 2018-05-27 ENCOUNTER — Emergency Department: Payer: Medicare HMO

## 2018-05-27 ENCOUNTER — Encounter: Payer: Self-pay | Admitting: Emergency Medicine

## 2018-05-27 ENCOUNTER — Emergency Department
Admission: EM | Admit: 2018-05-27 | Discharge: 2018-05-27 | Disposition: A | Discharge status: Home / Self Care | Payer: Medicare HMO | Attending: Emergency Medicine | Admitting: Emergency Medicine

## 2018-05-27 ENCOUNTER — Other Ambulatory Visit: Payer: Self-pay

## 2018-05-27 DIAGNOSIS — Z79899 Other long term (current) drug therapy: Secondary | ICD-10-CM | POA: Diagnosis not present

## 2018-05-27 DIAGNOSIS — E039 Hypothyroidism, unspecified: Secondary | ICD-10-CM | POA: Diagnosis not present

## 2018-05-27 DIAGNOSIS — N390 Urinary tract infection, site not specified: Secondary | ICD-10-CM

## 2018-05-27 DIAGNOSIS — R42 Dizziness and giddiness: Secondary | ICD-10-CM | POA: Insufficient documentation

## 2018-05-27 DIAGNOSIS — R55 Syncope and collapse: Secondary | ICD-10-CM

## 2018-05-27 DIAGNOSIS — I10 Essential (primary) hypertension: Secondary | ICD-10-CM | POA: Insufficient documentation

## 2018-05-27 DIAGNOSIS — G930 Cerebral cysts: Secondary | ICD-10-CM

## 2018-05-27 DIAGNOSIS — Z7982 Long term (current) use of aspirin: Secondary | ICD-10-CM | POA: Insufficient documentation

## 2018-05-27 LAB — URINALYSIS, COMPLETE (UACMP) WITH MICROSCOPIC
BACTERIA UA: NONE SEEN
Bilirubin Urine: NEGATIVE
GLUCOSE, UA: NEGATIVE mg/dL
HGB URINE DIPSTICK: NEGATIVE
KETONES UR: 5 mg/dL — AB
Nitrite: NEGATIVE
PROTEIN: NEGATIVE mg/dL
Specific Gravity, Urine: 1.025 (ref 1.005–1.030)
pH: 5 (ref 5.0–8.0)

## 2018-05-27 LAB — BASIC METABOLIC PANEL
ANION GAP: 9 (ref 5–15)
BUN: 23 mg/dL (ref 8–23)
CHLORIDE: 109 mmol/L (ref 98–111)
CO2: 23 mmol/L (ref 22–32)
Calcium: 9.1 mg/dL (ref 8.9–10.3)
Creatinine, Ser: 1.22 mg/dL — ABNORMAL HIGH (ref 0.44–1.00)
GFR calc Af Amer: 49 mL/min — ABNORMAL LOW (ref >60)
GFR calc non Af Amer: 42 mL/min — ABNORMAL LOW (ref >60)
GLUCOSE: 97 mg/dL (ref 70–99)
POTASSIUM: 4.4 mmol/L (ref 3.5–5.1)
Sodium: 141 mmol/L (ref 135–145)

## 2018-05-27 LAB — CBC
HEMATOCRIT: 40.1 % (ref 35.0–47.0)
HEMOGLOBIN: 13.6 g/dL (ref 12.0–16.0)
MCH: 30.1 pg (ref 26.0–34.0)
MCHC: 33.9 g/dL (ref 32.0–36.0)
MCV: 88.7 fL (ref 80.0–100.0)
Platelets: 248 10*3/uL (ref 150–440)
RBC: 4.52 MIL/uL (ref 3.80–5.20)
RDW: 13.4 % (ref 11.5–14.5)
WBC: 6.7 10*3/uL (ref 3.6–11.0)

## 2018-05-27 LAB — TROPONIN I

## 2018-05-27 MED ORDER — ACETAMINOPHEN 325 MG PO TABS
650.0000 mg | ORAL_TABLET | Freq: Once | ORAL | Status: AC
  Administered 2018-05-27: 650 mg via ORAL
  Filled 2018-05-27: qty 2

## 2018-05-27 MED ORDER — CEPHALEXIN 500 MG PO CAPS
500.0000 mg | ORAL_CAPSULE | Freq: Once | ORAL | Status: AC
  Administered 2018-05-27: 500 mg via ORAL
  Filled 2018-05-27: qty 1

## 2018-05-27 MED ORDER — CEPHALEXIN 500 MG PO CAPS: 500.0000 mg | ORAL_CAPSULE | Freq: Two times a day (BID) | ORAL | 0 refills | Status: AC

## 2018-05-27 NOTE — ED Provider Notes (Signed)
[facility] Emergency Department Provider Note ____________________________________________   First MD Initiated Contact with Patient 05/27/18 1256     (approximate)  I have reviewed the triage vital signs and the nursing notes.   HISTORY  Chief Complaint Dizziness   HPI Laura Bishop is a 77 y.o. female  history of several past episodes of lightheadedness was presented to the emergency department lightheadedness today.  Said that she was getting out of her car and walking to a [place] store when she started feeling lightheaded.  She denies any palpitations or chest pain or shortness of breath.  Was able to sit down in the backseat of her car and let the sensation passed after about 10 minutes.  Says that she is a mild frontal headache at this time as well as left-sided thoracic back pain.  Says that she took a Tylenol this morning for her back pain which is chronic and says that she usually takes an afternoon dose of Tylenol as well.  Says that she also has diarrhea about every 3 days but has not had any increased diarrhea at this time.  No nausea or vomiting.   Past Medical History:  Diagnosis Date  . Chronic lower back pain   . Hyperlipidemia   . Hypertension   . Hypothyroidism   . IDA (iron deficiency anemia)   . Osteoarthritis of both knees   . Osteoporosis   . Overactive bladder   . Vitamin D deficiency     There are no active problems to display for this patient.   Past Surgical History:  Procedure Laterality Date  . ABDOMINAL HYSTERECTOMY    . BACK SURGERY    . COLONOSCOPY WITH PROPOFOL N/A 07/20/2016   Procedure: COLONOSCOPY WITH PROPOFOL;  Surgeon: Manya Silvas, MD;  Location: [facility] ENDOSCOPY;  Service: Endoscopy;  Laterality: N/A;  . ESOPHAGOGASTRODUODENOSCOPY (EGD) WITH PROPOFOL N/A 07/20/2016   Procedure: ESOPHAGOGASTRODUODENOSCOPY (EGD) WITH PROPOFOL;  Surgeon: Manya Silvas, MD;  Location: [facility] ENDOSCOPY;  Service: Endoscopy;   Laterality: N/A;    Prior to Admission medications   Medication Sig Start Date End Date Taking? Authorizing Provider  amLODipine (NORVASC) 5 MG tablet Take 10 mg by mouth daily.    Yes [provider]  aspirin 81 MG tablet Take 81 mg by mouth daily.    Yes [provider]  calcium carbonate (OSCAL) 1500 (600 Ca) MG TABS tablet Take 600 mg of elemental calcium by mouth daily with breakfast.    Yes [provider]  ferrous sulfate 325 (65 FE) MG EC tablet Take 325 mg by mouth daily.   Yes [provider]  levothyroxine (SYNTHROID, LEVOTHROID) 75 MCG tablet Take 75 mcg by mouth daily before breakfast.   Yes [provider]  Multiple Vitamin (MULTIVITAMIN) tablet Take 1 tablet by mouth daily.   Yes [provider]  oxybutynin (DITROPAN) 5 MG tablet Take 5 mg by mouth 2 (two) times daily.   Yes [provider]  pravastatin (PRAVACHOL) 80 MG tablet Take 80 mg by mouth at bedtime.    Yes [provider]  ranitidine (ZANTAC) 75 MG tablet Take 75 mg by mouth 2 (two) times daily as needed for heartburn.    Yes [provider]    Allergies Estrogens and Nsaids  No family history on file.  Social History Social History   Tobacco Use  . Smoking status: Never Smoker  . Smokeless tobacco: Never Used  Substance Use Topics  . Alcohol  use: No  . Drug use: No    Review of Systems  Constitutional: No fever/chills Eyes: No visual changes. ENT: No sore throat. Cardiovascular: Denies chest pain. Respiratory: Denies shortness of breath. Gastrointestinal: No abdominal pain.  No nausea, no vomiting.  No diarrhea.  No constipation. Genitourinary: Negative for dysuria. Musculoskeletal: As above Skin: Negative for rash. Neurological: Negative for focal weakness or numbness.   ____________________________________________   PHYSICAL EXAM:  VITAL SIGNS: ED Triage Vitals  Enc Vitals Group     BP 05/27/18 1051 (!)  146/70     Pulse Rate 05/27/18 1051 67     Resp 05/27/18 1051 18     Temp 05/27/18 1051 98.3 F (36.8 C)     Temp Source 05/27/18 1051 Oral     SpO2 05/27/18 1051 95 %     Weight 05/27/18 1058 170 lb (77.1 kg)     Height 05/27/18 1058 5' (1.524 m)     Head Circumference --      Peak Flow --      Pain Score 05/27/18 1057 5     Pain Loc --      Pain Edu? --      Excl. in [place]? --     Constitutional: Alert and oriented. Well appearing and in no acute distress. Eyes: Conjunctivae are normal.  Head: Atraumatic. Nose: No congestion/rhinnorhea. Mouth/Throat: Mucous membranes are moist.  Neck: No stridor.   Cardiovascular: Normal rate, regular rhythm. Grossly normal heart sounds.   Respiratory: Normal respiratory effort.  No retractions. Lungs CTAB. Gastrointestinal: Soft and nontender. No distention. No CVA tenderness. Musculoskeletal: No lower extremity tenderness nor edema.  No joint effusions.  Mild left thoracic ttp.  Pt says that this is wear she has her chronic back pain.   Neurologic:  Normal speech and language. No gross focal neurologic deficits are appreciated. Skin:  Skin is warm, dry and intact. No rash noted. Psychiatric: Mood and affect are normal. Speech and behavior are normal.  ____________________________________________   LABS (all labs ordered are listed, but only abnormal results are displayed)  Labs Reviewed  BASIC METABOLIC PANEL - Abnormal; Notable for the following components:      Result Value   Creatinine, Ser 1.22 (*)    GFR calc non Af Amer 42 (*)    GFR calc Af Amer 49 (*)    All other components within normal limits  URINALYSIS, COMPLETE (UACMP) WITH MICROSCOPIC - Abnormal; Notable for the following components:   Color, Urine YELLOW (*)    APPearance CLEAR (*)    Ketones, ur 5 (*)    Leukocytes, UA MODERATE (*)    All other components within normal limits  URINE CULTURE  CBC  TROPONIN I  CBG MONITORING, ED    ____________________________________________  EKG  ED ECG REPORT I, Doran Stabler, the attending physician, personally viewed and interpreted this ECG.   Date: 05/27/2018  EKG Time: 1107  Rate: 71  Rhythm: normal sinus rhythm  Axis: Normal  Intervals:none  ST&T Change: No ST segment elevation or depression.  No abnormal T wave inversion.  ____________________________________________  RADIOLOGY  No acute abnormality.  Moderately large fluid-filled space posterior to the cerebellum.  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 ____________________________________________   PROCEDURES  Procedure(s) performed:   Procedures  Critical Care performed:   ____________________________________________   INITIAL IMPRESSION / ASSESSMENT AND PLAN / ED COURSE  Pertinent labs & imaging results that were available during my care of the patient were reviewed by  me and considered in my medical decision making (see chart for details).  DDX: Electrolyte abnormality, dehydration, UTI, ACS, stroke As part of my medical decision making, I reviewed the following data within the [place] Notes from prior ED visits  ----------------------------------------- 4:21 PM on 05/27/2018 -----------------------------------------  Patient with moderate leukocytes as well as 21-50 WBCs.  Pain-free and asymptomatic at this time.  Reassuring blood work several hours out from the incident.  Reassuring EKG.  Discussed the finding of the likely cyst on the CT scan with Dr. Doy Mince of neurology recommends outpatient follow-up.  Unclear and probably unlikely that this would be causing the patient's symptoms.  She will also follow-up with cardiology.  He is understanding of the treatment plan and willing to comply.  We also discussed the diagnoses including the CT results. ____________________________________________   FINAL CLINICAL IMPRESSION(S) / ED DIAGNOSES  Near-syncope.  UTI.  Arachnoid  cyst.  NEW MEDICATIONS STARTED DURING THIS VISIT:  New Prescriptions   No medications on file     Note:  This document was prepared using Dragon voice recognition software and may include unintentional dictation errors.     Orbie Pyo, MD 05/27/18 [phone]

## 2018-05-27 NOTE — ED Notes (Signed)
Pt to and from CT. ABCs intact. NAD. 

## 2018-05-27 NOTE — ED Triage Notes (Signed)
States was getting out of the car today at around 1000 and felt dizzy and nauseated.  States dizziness has improved but now also c/o headache across her forehead.   AAOx3.  Skin warm and dry.  MAE equally and strong.  Speech clear.  Facial movements equal.  Sensation wnl.  NAD

## 2018-05-27 NOTE — ED Notes (Signed)
Pt assisted to and from bathroom. ABCs intact. NAD.

## 2018-05-28 LAB — URINE CULTURE: CULTURE: NO GROWTH

## 2018-06-07 ENCOUNTER — Other Ambulatory Visit: Payer: Self-pay | Admitting: Acute Care

## 2018-06-07 DIAGNOSIS — R42 Dizziness and giddiness: Secondary | ICD-10-CM

## 2018-06-22 ENCOUNTER — Ambulatory Visit
Admission: RE | Admit: 2018-06-22 | Discharge: 2018-06-22 | Disposition: A | Discharge status: Home / Self Care | Payer: Medicare HMO | Source: Ambulatory Visit | Attending: Acute Care | Admitting: Acute Care

## 2018-06-22 DIAGNOSIS — G930 Cerebral cysts: Secondary | ICD-10-CM | POA: Insufficient documentation

## 2018-06-22 DIAGNOSIS — R42 Dizziness and giddiness: Secondary | ICD-10-CM

## 2018-06-22 DIAGNOSIS — R9082 White matter disease, unspecified: Secondary | ICD-10-CM | POA: Diagnosis not present

## 2018-11-28 ENCOUNTER — Other Ambulatory Visit: Payer: Self-pay

## 2018-11-28 ENCOUNTER — Encounter: Payer: Self-pay | Admitting: Emergency Medicine

## 2018-11-28 ENCOUNTER — Ambulatory Visit
Admission: EM | Admit: 2018-11-28 | Discharge: 2018-11-28 | Disposition: A | Discharge status: Home / Self Care | Payer: Medicare HMO | Attending: Family Medicine | Admitting: Family Medicine

## 2018-11-28 DIAGNOSIS — B9789 Other viral agents as the cause of diseases classified elsewhere: Secondary | ICD-10-CM

## 2018-11-28 DIAGNOSIS — R05 Cough: Secondary | ICD-10-CM

## 2018-11-28 DIAGNOSIS — J029 Acute pharyngitis, unspecified: Secondary | ICD-10-CM

## 2018-11-28 DIAGNOSIS — R5383 Other fatigue: Secondary | ICD-10-CM | POA: Diagnosis not present

## 2018-11-28 DIAGNOSIS — J069 Acute upper respiratory infection, unspecified: Secondary | ICD-10-CM | POA: Diagnosis not present

## 2018-11-28 LAB — RAPID INFLUENZA A&B ANTIGENS
Influenza A (ARMC): NEGATIVE
Influenza B (ARMC): NEGATIVE

## 2018-11-28 MED ORDER — BENZONATATE 200 MG PO CAPS: 200.0000 mg | ORAL_CAPSULE | Freq: Three times a day (TID) | ORAL | 0 refills | Status: DC | PRN

## 2018-11-28 NOTE — ED Triage Notes (Signed)
Patient c/o cough and headache that started 2 days ago. Patient denies fever but does report chills.

## 2018-11-28 NOTE — ED Provider Notes (Signed)
[facility]    CSN: [id number] Arrival date & time: 11/28/18  9767     History   Chief Complaint Chief Complaint  Patient presents with  . Cough    HPI Laura Bishop is a 78 y.o. female.   The history is provided by the patient.  Cough  Associated symptoms: sore throat   URI  Presenting symptoms: cough, fatigue and sore throat   Severity:  Moderate Onset quality:  Sudden Duration:  2 days Timing:  Constant Progression:  Unchanged Chronicity:  New Relieved by:  None tried Ineffective treatments:  None tried Risk factors: being elderly   Risk factors: no sick contacts     Past Medical History:  Diagnosis Date  . Chronic lower back pain   . Hyperlipidemia   . Hypertension   . Hypothyroidism   . IDA (iron deficiency anemia)   . Osteoarthritis of both knees   . Osteoporosis   . Overactive bladder   . Vitamin D deficiency     There are no active problems to display for this patient.   Past Surgical History:  Procedure Laterality Date  . ABDOMINAL HYSTERECTOMY    . BACK SURGERY    . COLONOSCOPY WITH PROPOFOL N/A 07/20/2016   Procedure: COLONOSCOPY WITH PROPOFOL;  Surgeon: Manya Silvas, MD;  Location: [facility]COPY;  Service: Endoscopy;  Laterality: N/A;  . ESOPHAGOGASTRODUODENOSCOPY (EGD) WITH PROPOFOL N/A 07/20/2016   Procedure: ESOPHAGOGASTRODUODENOSCOPY (EGD) WITH PROPOFOL;  Surgeon: Manya Silvas, MD;  Location: [facility] ENDOSCOPY;  Service: Endoscopy;  Laterality: N/A;    OB History   No obstetric history on file.      Home Medications    Prior to Admission medications   Medication Sig Start Date End Date Taking? Authorizing Provider  amLODipine (NORVASC) 5 MG tablet Take 10 mg by mouth daily.    Yes [provider]  aspirin 81 MG tablet Take 81 mg by mouth daily.    Yes [provider]  calcium carbonate (OSCAL) 1500 (600 Ca) MG TABS tablet Take 600 mg of elemental calcium by mouth daily with breakfast.    Yes  [provider]  ferrous sulfate 325 (65 FE) MG EC tablet Take 325 mg by mouth daily.   Yes [provider]  levothyroxine (SYNTHROID, LEVOTHROID) 75 MCG tablet Take 75 mcg by mouth daily before breakfast.   Yes [provider]  Multiple Vitamin (MULTIVITAMIN) tablet Take 1 tablet by mouth daily.   Yes [provider]  oxybutynin (DITROPAN) 5 MG tablet Take 5 mg by mouth 2 (two) times daily.   Yes [provider]  pravastatin (PRAVACHOL) 80 MG tablet Take 80 mg by mouth at bedtime.    Yes [provider]  ranitidine (ZANTAC) 75 MG tablet Take 75 mg by mouth 2 (two) times daily as needed for heartburn.    Yes [provider]  benzonatate (TESSALON) 200 MG capsule Take 1 capsule (200 mg total) by mouth 3 (three) times daily as needed. 11/28/18   Norval Gable, MD    Family History History reviewed. No pertinent family history.  Social History Social History   Tobacco Use  . Smoking status: Never Smoker  . Smokeless tobacco: Never Used  Substance Use Topics  . Alcohol use: No  . Drug use: No     Allergies   Estrogens and Nsaids   Review of Systems Review of Systems  Constitutional: Positive for fatigue.  HENT: Positive for sore throat.   Respiratory:  Positive for cough.      Physical Exam Triage Vital Signs ED Triage Vitals  Enc Vitals Group     BP 11/28/18 1022 136/84     Pulse Rate 11/28/18 1022 63     Resp 11/28/18 1022 18     Temp 11/28/18 1022 98.1 F (36.7 C)     Temp Source 11/28/18 1022 Oral     SpO2 11/28/18 1022 99 %     Weight 11/28/18 1021 176 lb (79.8 kg)     Height 11/28/18 1021 5' (1.524 m)     Head Circumference --      Peak Flow --      Pain Score 11/28/18 1021 0     Pain Loc --      Pain Edu? --      Excl. in [place]? --    No data found.  Updated Vital Signs BP 136/84 (BP Location: Right Arm)   Pulse 63   Temp 98.1 F (36.7 C) (Oral)   Resp 18   Ht 5' (1.524 m)   Wt 79.8 kg    SpO2 99%   BMI 34.37 kg/m   Visual Acuity Right Eye Distance:   Left Eye Distance:   Bilateral Distance:    Right Eye Near:   Left Eye Near:    Bilateral Near:     Physical Exam Vitals signs and nursing note reviewed.  Constitutional:      General: She is not in acute distress.    Appearance: She is well-developed. She is not toxic-appearing or diaphoretic.  HENT:     Head: Normocephalic and atraumatic.     Right Ear: Tympanic membrane, ear canal and external ear normal.     Left Ear: Tympanic membrane, ear canal and external ear normal.     Mouth/Throat:     Pharynx: Uvula midline. No oropharyngeal exudate.  Eyes:     General: No scleral icterus.       Right eye: No discharge.        Left eye: No discharge.  Neck:     Musculoskeletal: Normal range of motion and neck supple.     Thyroid: No thyromegaly.  Cardiovascular:     Rate and Rhythm: Normal rate and regular rhythm.     Heart sounds: Normal heart sounds.  Pulmonary:     Effort: Pulmonary effort is normal. No respiratory distress.     Breath sounds: Normal breath sounds. No stridor. No wheezing, rhonchi or rales.  Lymphadenopathy:     Cervical: No cervical adenopathy.  Neurological:     Mental Status: She is alert.      UC Treatments / Results  Labs (all labs ordered are listed, but only abnormal results are displayed) Labs Reviewed  RAPID INFLUENZA A&B ANTIGENS ([place])    EKG None  Radiology No results found.  Procedures Procedures (including critical care time)  Medications Ordered in UC Medications - No data to display  Initial Impression / Assessment and Plan / UC Course  I have reviewed the triage vital signs and the nursing notes.  Pertinent labs & imaging results that were available during my care of the patient were reviewed by me and considered in my medical decision making (see chart for details).      Final Clinical Impressions(s) / UC Diagnoses   Final diagnoses:  Viral  URI with cough    ED Prescriptions    Medication Sig Dispense Auth. Provider   benzonatate (TESSALON) 200 MG capsule Take  1 capsule (200 mg total) by mouth 3 (three) times daily as needed. 30 capsule Norval Gable, MD     1. Lab results and diagnosis reviewed with patient 2. rx as per orders above; reviewed possible side effects, interactions, risks and benefits  3. Recommend supportive treatment with fluids, tylenol/advil as needed 4. Follow-up prn if symptoms worsen or don't improve   Controlled Substance Prescriptions [place] Controlled Substance Registry consulted? Not Applicable   Norval Gable, MD 11/28/18 1119

## 2019-01-11 ENCOUNTER — Other Ambulatory Visit
Admission: RE | Admit: 2019-01-11 | Discharge: 2019-01-11 | Disposition: A | Discharge status: Home / Self Care | Payer: Medicare HMO | Source: Ambulatory Visit | Attending: Student | Admitting: Student

## 2019-01-11 DIAGNOSIS — K529 Noninfective gastroenteritis and colitis, unspecified: Secondary | ICD-10-CM | POA: Diagnosis present

## 2019-01-11 LAB — GASTROINTESTINAL PANEL BY PCR, STOOL (REPLACES STOOL CULTURE)

## 2019-01-11 LAB — C DIFFICILE QUICK SCREEN W PCR REFLEX
C Diff antigen: NEGATIVE
C Diff interpretation: NOT DETECTED

## 2019-01-11 LAB — C DIFFICILE QUICK SCREEN W PCR REFLEX??: C Diff toxin: NEGATIVE

## 2019-01-13 LAB — CALPROTECTIN, FECAL: Calprotectin, Fecal: 76 ug/g (ref 0–120)

## 2019-01-13 LAB — PANCREATIC ELASTASE, FECAL: Pancreatic Elastase-1, Stool: >500 ug Elast./g (ref >200)

## 2019-02-10 ENCOUNTER — Other Ambulatory Visit: Payer: Self-pay | Admitting: Internal Medicine

## 2019-02-10 DIAGNOSIS — N644 Mastodynia: Secondary | ICD-10-CM

## 2019-02-10 DIAGNOSIS — Z1231 Encounter for screening mammogram for malignant neoplasm of breast: Secondary | ICD-10-CM

## 2019-02-20 ENCOUNTER — Ambulatory Visit
Admission: RE | Admit: 2019-02-20 | Discharge: 2019-02-20 | Disposition: A | Discharge status: Home / Self Care | Payer: Medicare HMO | Source: Ambulatory Visit | Attending: Internal Medicine | Admitting: Internal Medicine

## 2019-02-20 ENCOUNTER — Other Ambulatory Visit: Payer: Self-pay

## 2019-02-20 DIAGNOSIS — N644 Mastodynia: Secondary | ICD-10-CM

## 2019-02-22 ENCOUNTER — Other Ambulatory Visit
Admission: RE | Admit: 2019-02-22 | Discharge: 2019-02-22 | Disposition: A | Discharge status: Home / Self Care | Payer: Medicare HMO | Source: Ambulatory Visit | Attending: Unknown Physician Specialty | Admitting: Unknown Physician Specialty

## 2019-02-22 ENCOUNTER — Other Ambulatory Visit: Payer: Self-pay

## 2019-02-22 DIAGNOSIS — Z1159 Encounter for screening for other viral diseases: Secondary | ICD-10-CM | POA: Diagnosis present

## 2019-02-23 LAB — NOVEL CORONAVIRUS, NAA (HOSP ORDER, SEND-OUT TO REF LAB; TAT 18-24 HRS): SARS-CoV-2, NAA: NOT DETECTED

## 2019-02-24 ENCOUNTER — Encounter: Payer: Self-pay | Admitting: Anesthesiology

## 2019-02-27 ENCOUNTER — Encounter
Admission: RE | Disposition: A | Discharge status: Home / Self Care | Payer: Self-pay | Source: Home / Self Care | Attending: Unknown Physician Specialty

## 2019-02-27 ENCOUNTER — Encounter: Payer: Self-pay | Admitting: *Deleted

## 2019-02-27 ENCOUNTER — Other Ambulatory Visit: Payer: Self-pay

## 2019-02-27 ENCOUNTER — Ambulatory Visit
Admission: RE | Admit: 2019-02-27 | Discharge: 2019-02-27 | Disposition: A | Discharge status: Home / Self Care | Payer: Medicare HMO | Attending: Unknown Physician Specialty | Admitting: Unknown Physician Specialty

## 2019-02-27 ENCOUNTER — Ambulatory Visit: Payer: Medicare HMO | Admitting: Anesthesiology

## 2019-02-27 DIAGNOSIS — Z86010 Personal history of colonic polyps: Secondary | ICD-10-CM | POA: Insufficient documentation

## 2019-02-27 DIAGNOSIS — Z7982 Long term (current) use of aspirin: Secondary | ICD-10-CM | POA: Insufficient documentation

## 2019-02-27 DIAGNOSIS — E785 Hyperlipidemia, unspecified: Secondary | ICD-10-CM | POA: Diagnosis not present

## 2019-02-27 DIAGNOSIS — K635 Polyp of colon: Secondary | ICD-10-CM | POA: Diagnosis not present

## 2019-02-27 DIAGNOSIS — D509 Iron deficiency anemia, unspecified: Secondary | ICD-10-CM | POA: Diagnosis not present

## 2019-02-27 DIAGNOSIS — K640 First degree hemorrhoids: Secondary | ICD-10-CM | POA: Insufficient documentation

## 2019-02-27 DIAGNOSIS — E039 Hypothyroidism, unspecified: Secondary | ICD-10-CM | POA: Insufficient documentation

## 2019-02-27 DIAGNOSIS — Z09 Encounter for follow-up examination after completed treatment for conditions other than malignant neoplasm: Secondary | ICD-10-CM | POA: Insufficient documentation

## 2019-02-27 DIAGNOSIS — D122 Benign neoplasm of ascending colon: Secondary | ICD-10-CM | POA: Diagnosis not present

## 2019-02-27 DIAGNOSIS — K529 Noninfective gastroenteritis and colitis, unspecified: Secondary | ICD-10-CM | POA: Diagnosis not present

## 2019-02-27 DIAGNOSIS — Z79899 Other long term (current) drug therapy: Secondary | ICD-10-CM | POA: Diagnosis not present

## 2019-02-27 DIAGNOSIS — I10 Essential (primary) hypertension: Secondary | ICD-10-CM | POA: Diagnosis not present

## 2019-02-27 DIAGNOSIS — K5730 Diverticulosis of large intestine without perforation or abscess without bleeding: Secondary | ICD-10-CM | POA: Diagnosis not present

## 2019-02-27 HISTORY — PX: COLONOSCOPY WITH PROPOFOL: SHX5780

## 2019-02-27 SURGERY — COLONOSCOPY WITH PROPOFOL
Anesthesia: General

## 2019-02-27 MED ORDER — PROPOFOL 10 MG/ML IV BOLUS
INTRAVENOUS | Status: DC | PRN
  Administered 2019-02-27: 10 mg via INTRAVENOUS
  Administered 2019-02-27: 40 mg via INTRAVENOUS
  Administered 2019-02-27: 10 mg via INTRAVENOUS

## 2019-02-27 MED ORDER — PROPOFOL 500 MG/50ML IV EMUL
INTRAVENOUS | Status: DC | PRN
  Administered 2019-02-27: 125 ug/kg/min via INTRAVENOUS

## 2019-02-27 MED ORDER — PROPOFOL 500 MG/50ML IV EMUL
INTRAVENOUS | Status: AC
  Filled 2019-02-27: qty 50

## 2019-02-27 MED ORDER — SODIUM CHLORIDE 0.9 % IV SOLN
INTRAVENOUS | Status: DC
  Administered 2019-02-27: 08:00:00 via INTRAVENOUS

## 2019-02-27 MED ORDER — SODIUM CHLORIDE 0.9 % IV SOLN: INTRAVENOUS | Status: DC

## 2019-02-27 NOTE — Anesthesia Postprocedure Evaluation (Signed)
Anesthesia Post Note  Patient: Laura Bishop  Procedure(s) Performed: COLONOSCOPY WITH PROPOFOL (N/A )  Patient location during evaluation: Endoscopy Anesthesia Type: General Level of consciousness: awake and alert Pain management: pain level controlled Vital Signs Assessment: post-procedure vital signs reviewed and stable Respiratory status: spontaneous breathing, nonlabored ventilation, respiratory function stable and patient connected to nasal cannula oxygen Cardiovascular status: blood pressure returned to baseline and stable Postop Assessment: no apparent nausea or vomiting Anesthetic complications: no     Last Vitals:  Vitals:   02/27/19 0845 02/27/19 0855  BP: 131/79 137/72  Pulse:    Resp:    Temp:    SpO2:      Last Pain:  Vitals:   02/27/19 0855  TempSrc:   PainSc: 0-No pain                 [place] Faul[place] S

## 2019-02-27 NOTE — Op Note (Signed)
[facility] Gastroenterology Patient Name: Laura Bishop Procedure Date: 02/27/2019 7:50 AM MRN: [id number] Account #: [id number] Date of Birth: [birth year] Admit Type: Outpatient Age: 78 Room: [facility] ENDO ROOM 4 Gender: Female Note Status: Finalized Procedure:            Colonoscopy Indications:          High risk colon cancer surveillance: Personal history                        of multiple (3 or more) adenomas Providers:            Manya Silvas, MD Referring MD:         Christena Flake. Raechel Ache, MD (Referring MD) Medicines:            Propofol per Anesthesia Complications:        No immediate complications. Procedure:            Pre-Anesthesia Assessment:                       - After reviewing the risks and benefits, the patient                        was deemed in satisfactory condition to undergo the                        procedure.                       After obtaining informed consent, the colonoscope was                        passed under direct vision. Throughout the procedure,                        the patient's blood pressure, pulse, and oxygen                        saturations were monitored continuously. The                        Colonoscope was introduced through the anus and                        advanced to the the cecum, identified by appendiceal                        orifice and ileocecal valve. The colonoscopy was                        performed without difficulty. The patient tolerated the                        procedure well. The quality of the bowel preparation                        was good. Findings:      A diminutive polyp was found in the recto-sigmoid colon. The polyp was       sessile. The polyp was removed with a jumbo cold forceps. Resection and       retrieval were complete.      Three  sessile polyps were found in the distal ascending colon and cecum.       The polyps were diminutive in size. These polyps were removed with a       jumbo cold forceps. Resection and retrieval were complete.      Many small-mouthed diverticula were found in the sigmoid colon,       descending colon and transverse colon.      Internal hemorrhoids were found during endoscopy. The hemorrhoids were       small and Grade I (internal hemorrhoids that do not prolapse).      The exam was otherwise without abnormality. Impression:           - One diminutive polyp at the recto-sigmoid colon,                        removed with a jumbo cold forceps. Resected and                        retrieved.                       - Three diminutive polyps in the distal ascending colon                        and in the cecum, removed with a jumbo cold forceps.                        Resected and retrieved.                       - Diverticulosis in the sigmoid colon, in the                        descending colon and in the transverse colon.                       - Internal hemorrhoids.                       - The examination was otherwise normal. Recommendation:       - Await pathology results. Manya Silvas, MD 02/27/2019 8:27:06 AM This report has been signed electronically. Number of Addenda: 0 Note Initiated On: 02/27/2019 7:50 AM Scope Withdrawal Time: 0 hours 10 minutes 3 seconds  Total Procedure Duration: 0 hours 22 minutes 54 seconds       [facility]

## 2019-02-27 NOTE — Anesthesia Preprocedure Evaluation (Signed)
Anesthesia Evaluation  Patient identified by MRN, date of birth, ID band Patient awake    Reviewed: Allergy & Precautions, NPO status , Patient's Chart, lab work & pertinent test results, reviewed documented beta blocker date and time   Airway Mallampati: II  TM Distance: >3 FB     Dental  (+) Chipped, Upper Dentures   Pulmonary           Cardiovascular hypertension, Pt. on medications      Neuro/Psych    GI/Hepatic   Endo/Other  Hypothyroidism   Renal/GU      Musculoskeletal  (+) Arthritis ,   Abdominal   Peds  Hematology  (+) anemia ,   Anesthesia Other Findings EKG shows poor R waves, otherwise ok. No cardiac symptoms.  Reproductive/Obstetrics                             Anesthesia Physical Anesthesia Plan  ASA: III  Anesthesia Plan: General   Post-op Pain Management:    Induction: Intravenous  PONV Risk Score and Plan:   Airway Management Planned:   Additional Equipment:   Intra-op Plan:   Post-operative Plan:   Informed Consent: I have reviewed the patients History and Physical, chart, labs and discussed the procedure including the risks, benefits and alternatives for the proposed anesthesia with the patient or authorized representative who has indicated his/her understanding and acceptance.       Plan Discussed with: CRNA  Anesthesia Plan Comments:         Anesthesia Quick Evaluation

## 2019-02-27 NOTE — Anesthesia Post-op Follow-up Note (Signed)
Anesthesia QCDR form completed.        

## 2019-02-27 NOTE — Transfer of Care (Signed)
Immediate Anesthesia Transfer of Care Note  Patient: Laura Bishop  Procedure(s) Performed: COLONOSCOPY WITH PROPOFOL (N/A )  Patient Location: PACU  Anesthesia Type:General  Level of Consciousness: awake, alert  and oriented  Airway & Oxygen Therapy: Patient Spontanous Breathing and Patient connected to nasal cannula oxygen  Post-op Assessment: Report given to RN and Post -op Vital signs reviewed and stable  Post vital signs: Reviewed and stable  Last Vitals:  Vitals Value Taken Time  BP 104/70 02/27/2019  8:25 AM  Temp 36.1 C 02/27/2019  8:25 AM  Pulse 75 02/27/2019  8:25 AM  Resp 12 02/27/2019  8:25 AM  SpO2 98 % 02/27/2019  8:25 AM  Vitals shown include unvalidated device data.  Last Pain:  Vitals:   02/27/19 0825  TempSrc: Tympanic  PainSc: Asleep         Complications: No apparent anesthesia complications

## 2019-02-27 NOTE — H&P (Signed)
Primary Care Physician:  Ezequiel Kayser, MD Primary Gastroenterologist:  Dr. Vira Agar  Pre-Procedure History & Physical: HPI:  Laura Bishop is a 78 y.o. female is here for an colonoscopy.  PH colon polyps.   Past Medical History:  Diagnosis Date  . Chronic lower back pain   . Hyperlipidemia   . Hypertension   . Hypothyroidism   . IDA (iron deficiency anemia)   . Osteoarthritis of both knees   . Osteoporosis   . Overactive bladder   . Vitamin D deficiency     Past Surgical History:  Procedure Laterality Date  . ABDOMINAL HYSTERECTOMY    . BACK SURGERY    . COLONOSCOPY WITH PROPOFOL N/A 07/20/2016   Procedure: COLONOSCOPY WITH PROPOFOL;  Surgeon: Manya Silvas, MD;  Location: [facility] ENDOSCOPY;  Service: Endoscopy;  Laterality: N/A;  . ESOPHAGOGASTRODUODENOSCOPY (EGD) WITH PROPOFOL N/A 07/20/2016   Procedure: ESOPHAGOGASTRODUODENOSCOPY (EGD) WITH PROPOFOL;  Surgeon: Manya Silvas, MD;  Location: [facility] ENDOSCOPY;  Service: Endoscopy;  Laterality: N/A;    Prior to Admission medications   Medication Sig Start Date End Date Taking? Authorizing Provider  amLODipine (NORVASC) 5 MG tablet Take 10 mg by mouth daily.    Yes [provider]  aspirin 81 MG tablet Take 81 mg by mouth daily.    Yes [provider]  benzonatate (TESSALON) 200 MG capsule Take 1 capsule (200 mg total) by mouth 3 (three) times daily as needed. 11/28/18  Yes Norval Gable, MD  calcium carbonate (OSCAL) 1500 (600 Ca) MG TABS tablet Take 600 mg of elemental calcium by mouth daily with breakfast.    Yes [provider]  ferrous sulfate 325 (65 FE) MG EC tablet Take 325 mg by mouth daily.   Yes [provider]  levothyroxine (SYNTHROID, LEVOTHROID) 75 MCG tablet Take 75 mcg by mouth daily before breakfast.   Yes [provider]  Multiple Vitamin (MULTIVITAMIN) tablet Take 1 tablet by mouth daily.   Yes [provider]  oxybutynin (DITROPAN) 5 MG tablet Take 5  mg by mouth 2 (two) times daily.   Yes [provider]  pravastatin (PRAVACHOL) 80 MG tablet Take 80 mg by mouth at bedtime.    Yes [provider]  ranitidine (ZANTAC) 75 MG tablet Take 75 mg by mouth 2 (two) times daily as needed for heartburn.    Yes [provider]    Allergies as of 02/21/2019 - Review Complete 11/28/2018  Allergen Reaction Noted  . Estrogens  07/17/2016  . Nsaids  07/17/2016    History reviewed. No pertinent family history.  Social History   Socioeconomic History  . Marital status: Married    Spouse name: Not on file  . Number of children: Not on file  . Years of education: Not on file  . Highest education level: Not on file  Occupational History  . Not on file  Social Needs  . Financial resource strain: Not on file  . Food insecurity:    Worry: Not on file    Inability: Not on file  . Transportation needs:    Medical: Not on file    Non-medical: Not on file  Tobacco Use  . Smoking status: Never Smoker  . Smokeless tobacco: Never Used  Substance and Sexual Activity  . Alcohol use: No  . Drug use: No  . Sexual activity: Not on file  Lifestyle  . Physical activity:    Days per week: Not on file    Minutes  per session: Not on file  . Stress: Not on file  Relationships  . Social connections:    Talks on phone: Not on file    Gets together: Not on file    Attends religious service: Not on file    Active member of club or organization: Not on file    Attends meetings of clubs or organizations: Not on file    Relationship status: Not on file  . Intimate partner violence:    Fear of current or ex partner: Not on file    Emotionally abused: Not on file    Physically abused: Not on file    Forced sexual activity: Not on file  Other Topics Concern  . Not on file  Social History Narrative  . Not on file    Review of Systems: See HPI, otherwise negative ROS  Physical Exam: BP (!) 159/92   Pulse 69   Temp (!) 97  F (36.1 C) (Tympanic)   Resp 18   Ht 5' (1.524 m)   Wt 79.8 kg   SpO2 98%   BMI 34.37 kg/m  General:   Alert,  pleasant and cooperative in NAD Head:  Normocephalic and atraumatic. Neck:  Supple; no masses or thyromegaly. Lungs:  Clear throughout to auscultation.    Heart:  Regular rate and rhythm. Abdomen:  Soft, nontender and nondistended. Normal bowel sounds, without guarding, and without rebound.   Neurologic:  Alert and  oriented x4;  grossly normal neurologically.  Impression/Plan: Laura Bishop is here for an colonoscopy to be performed for [facility] colon polyps  Risks, benefits, limitations, and alternatives regarding  colonoscopy have been reviewed with the patient.  Questions have been answered.  All parties agreeable.   Gaylyn Cheers, MD  02/27/2019, 7:49 AM

## 2019-02-28 LAB — SURGICAL PATHOLOGY

## 2019-08-18 ENCOUNTER — Encounter: Payer: Self-pay | Admitting: Emergency Medicine

## 2019-08-18 ENCOUNTER — Other Ambulatory Visit: Payer: Self-pay

## 2019-08-18 ENCOUNTER — Telehealth: Payer: Self-pay | Admitting: Family Medicine

## 2019-08-18 ENCOUNTER — Ambulatory Visit
Admission: EM | Admit: 2019-08-18 | Discharge: 2019-08-18 | Disposition: A | Discharge status: Home / Self Care | Payer: Medicare HMO | Attending: Family Medicine | Admitting: Family Medicine

## 2019-08-18 DIAGNOSIS — J069 Acute upper respiratory infection, unspecified: Secondary | ICD-10-CM

## 2019-08-18 LAB — RAPID STREP SCREEN (MED CTR MEBANE ONLY): Streptococcus, Group A Screen (Direct): NEGATIVE

## 2019-08-18 MED ORDER — BENZONATATE 200 MG PO CAPS: 200.0000 mg | ORAL_CAPSULE | Freq: Three times a day (TID) | ORAL | 0 refills | Status: DC | PRN

## 2019-08-18 NOTE — ED Provider Notes (Signed)
[facility]    CSN: [id number] Arrival date & time: 08/18/19  1013      History   Chief Complaint Chief Complaint  Patient presents with  . Cough  . Sore Throat    HPI Laura Bishop is a 78 y.o. female.   78 yo female with a c/o cough, sore throat, chills, and headaches for the past 3-4 days. Denies any fevers, shortness of breath, chest pains. No known sick contacts.    Cough Sore Throat    Past Medical History:  Diagnosis Date  . Chronic lower back pain   . Hyperlipidemia   . Hypertension   . Hypothyroidism   . IDA (iron deficiency anemia)   . Osteoarthritis of both knees   . Osteoporosis   . Overactive bladder   . Vitamin D deficiency     There are no active problems to display for this patient.   Past Surgical History:  Procedure Laterality Date  . ABDOMINAL HYSTERECTOMY    . BACK SURGERY    . COLONOSCOPY WITH PROPOFOL N/A 07/20/2016   Procedure: COLONOSCOPY WITH PROPOFOL;  Surgeon: Manya Silvas, MD;  [facility] ENDOSCOPY;  Service: Endoscopy;  Laterality: N/A;  . COLONOSCOPY WITH PROPOFOL N/A 02/27/2019   Procedure: COLONOSCOPY WITH PROPOFOL;  Surgeon: Manya Silvas, MD;  Location: [facility] ENDOSCOPY;  Service: Endoscopy;  Laterality: N/A;  . ESOPHAGOGASTRODUODENOSCOPY (EGD) WITH PROPOFOL N/A 07/20/2016   Procedure: ESOPHAGOGASTRODUODENOSCOPY (EGD) WITH PROPOFOL;  Surgeon: Manya Silvas, MD;  Location: [facility] ENDOSCOPY;  Service: Endoscopy;  Laterality: N/A;    OB History   No obstetric history on file.      Home Medications    Prior to Admission medications   Medication Sig Start Date End Date Taking? Authorizing Provider  amLODipine (NORVASC) 5 MG tablet Take 10 mg by mouth daily.    Yes [provider]  aspirin 81 MG tablet Take 81 mg by mouth daily.    Yes [provider]  calcium carbonate (OSCAL) 1500 (600 Ca) MG TABS tablet Take 600 mg of elemental calcium by mouth daily with breakfast.    Yes  [provider]  ferrous sulfate 325 (65 FE) MG EC tablet Take 325 mg by mouth daily.   Yes [provider]  levothyroxine (SYNTHROID, LEVOTHROID) 75 MCG tablet Take 75 mcg by mouth daily before breakfast.   Yes [provider]  Multiple Vitamin (MULTIVITAMIN) tablet Take 1 tablet by mouth daily.   Yes [provider]  oxybutynin (DITROPAN) 5 MG tablet Take 5 mg by mouth 2 (two) times daily.   Yes [provider]  pravastatin (PRAVACHOL) 80 MG tablet Take 80 mg by mouth at bedtime.    Yes [provider]  ranitidine (ZANTAC) 75 MG tablet Take 75 mg by mouth 2 (two) times daily as needed for heartburn.    Yes [provider]  benzonatate (TESSALON) 200 MG capsule Take 1 capsule (200 mg total) by mouth 3 (three) times daily as needed. 11/28/18   Norval Gable, MD    Family History History reviewed. No pertinent family history.  Social History Social History   Tobacco Use  . Smoking status: Never Smoker  . Smokeless tobacco: Never Used  Substance Use Topics  . Alcohol use: No  . Drug use: No     Allergies   Estrogens and Nsaids   Review of Systems Review of Systems  Respiratory: Positive for cough.      Physical Exam Triage Vital  Signs ED Triage Vitals  Enc Vitals Group     BP 08/18/19 1037 (!) 185/85     Pulse Rate 08/18/19 1037 64     Resp 08/18/19 1037 16     Temp 08/18/19 1037 99 F (37.2 C)     Temp Source 08/18/19 1037 Oral     SpO2 08/18/19 1037 98 %     Weight 08/18/19 1034 155 lb (70.3 kg)     Height 08/18/19 1034 5' (1.524 m)     Head Circumference --      Peak Flow --      Pain Score 08/18/19 1034 5     Pain Loc --      Pain Edu? --      Excl. in [place]? --    No data found.  Updated Vital Signs BP (!) 185/85 (BP Location: Left Arm)   Pulse 64   Temp 99 F (37.2 C) (Oral)   Resp 16   Ht 5' (1.524 m)   Wt 70.3 kg   SpO2 98%   BMI 30.27 kg/m   Visual Acuity Right Eye Distance:    Left Eye Distance:   Bilateral Distance:    Right Eye Near:   Left Eye Near:    Bilateral Near:     Physical Exam Vitals signs and nursing note reviewed.  Constitutional:      General: She is not in acute distress.    Appearance: She is not toxic-appearing or diaphoretic.  HENT:     Nose: Congestion present.     Mouth/Throat:     Pharynx: Posterior oropharyngeal erythema present. No oropharyngeal exudate.  Cardiovascular:     Rate and Rhythm: Normal rate and regular rhythm.     Heart sounds: Normal heart sounds.  Pulmonary:     Effort: Pulmonary effort is normal. No respiratory distress.     Breath sounds: Normal breath sounds. No stridor. No wheezing, rhonchi or rales.  Neurological:     Mental Status: She is alert.      UC Treatments / Results  Labs (all labs ordered are listed, but only abnormal results are displayed) Labs Reviewed  RAPID STREP SCREEN ([facility] ONLY)  NOVEL CORONAVIRUS, NAA (HOSP ORDER, SEND-OUT TO REF LAB; TAT 18-24 HRS)  CULTURE, GROUP A STREP [facility])    EKG   Radiology No results found.  Procedures Procedures (including critical care time)  Medications Ordered in UC Medications - No data to display  Initial Impression / Assessment and Plan / UC Course  I have reviewed the triage vital signs and the nursing notes.  Pertinent labs & imaging results that were available during my care of the patient were reviewed by me and considered in my medical decision making (see chart for details).      Final Clinical Impressions(s) / UC Diagnoses   Final diagnoses:  Viral URI with cough    ED Prescriptions    None      1. Lab results and diagnosis reviewed with patient 2. rx as per orders above; reviewed possible side effects, interactions, risks and benefits  3. Recommend supportive treatment with rest, fluids, otc analgesics/medications prn 4. covid test done 5. Follow-up prn if symptoms worsen or don't improve   PDMP not  reviewed this encounter.   Norval Gable, MD 08/18/19 [phone]

## 2019-08-18 NOTE — ED Triage Notes (Signed)
Patient c/o cough, sore throat, and HAs that started earlier this week.  Patient denies fevers.

## 2019-08-18 NOTE — Telephone Encounter (Signed)
rx for tessalon perles sent

## 2019-08-19 LAB — NOVEL CORONAVIRUS, NAA (HOSP ORDER, SEND-OUT TO REF LAB; TAT 18-24 HRS): SARS-CoV-2, NAA: NOT DETECTED

## 2019-08-20 LAB — CULTURE, GROUP A STREP (THRC)

## 2019-12-04 ENCOUNTER — Inpatient Hospital Stay: Payer: Medicare HMO

## 2019-12-04 ENCOUNTER — Other Ambulatory Visit: Payer: Self-pay

## 2019-12-04 ENCOUNTER — Inpatient Hospital Stay: Payer: Medicare HMO | Attending: Oncology | Admitting: Oncology

## 2019-12-04 ENCOUNTER — Encounter: Payer: Self-pay | Admitting: Oncology

## 2019-12-04 DIAGNOSIS — E559 Vitamin D deficiency, unspecified: Secondary | ICD-10-CM | POA: Diagnosis not present

## 2019-12-04 DIAGNOSIS — E039 Hypothyroidism, unspecified: Secondary | ICD-10-CM | POA: Insufficient documentation

## 2019-12-04 DIAGNOSIS — M810 Age-related osteoporosis without current pathological fracture: Secondary | ICD-10-CM | POA: Diagnosis not present

## 2019-12-04 DIAGNOSIS — M170 Bilateral primary osteoarthritis of knee: Secondary | ICD-10-CM | POA: Diagnosis not present

## 2019-12-04 DIAGNOSIS — I129 Hypertensive chronic kidney disease with stage 1 through stage 4 chronic kidney disease, or unspecified chronic kidney disease: Secondary | ICD-10-CM | POA: Diagnosis not present

## 2019-12-04 DIAGNOSIS — R630 Anorexia: Secondary | ICD-10-CM | POA: Diagnosis not present

## 2019-12-04 DIAGNOSIS — D509 Iron deficiency anemia, unspecified: Secondary | ICD-10-CM | POA: Diagnosis not present

## 2019-12-04 DIAGNOSIS — R634 Abnormal weight loss: Secondary | ICD-10-CM | POA: Insufficient documentation

## 2019-12-04 DIAGNOSIS — Z79899 Other long term (current) drug therapy: Secondary | ICD-10-CM | POA: Insufficient documentation

## 2019-12-04 DIAGNOSIS — Z7982 Long term (current) use of aspirin: Secondary | ICD-10-CM | POA: Insufficient documentation

## 2019-12-04 DIAGNOSIS — E785 Hyperlipidemia, unspecified: Secondary | ICD-10-CM | POA: Diagnosis not present

## 2019-12-04 DIAGNOSIS — N184 Chronic kidney disease, stage 4 (severe): Secondary | ICD-10-CM | POA: Insufficient documentation

## 2019-12-04 DIAGNOSIS — R5383 Other fatigue: Secondary | ICD-10-CM | POA: Insufficient documentation

## 2019-12-04 LAB — CBC WITH DIFFERENTIAL/PLATELET
Abs Immature Granulocytes: 0.05 10*3/uL (ref 0.00–0.07)
Basophils Absolute: 0 10*3/uL (ref 0.0–0.1)
Basophils Relative: 0 %
Eosinophils Absolute: 0 10*3/uL (ref 0.0–0.5)
Eosinophils Relative: 0 %
HCT: 38.5 % (ref 36.0–46.0)
Hemoglobin: 12.3 g/dL (ref 12.0–15.0)
Immature Granulocytes: 1 %
Lymphocytes Relative: 16 %
Lymphs Abs: 1.5 10*3/uL (ref 0.7–4.0)
MCH: 30.5 pg (ref 26.0–34.0)
MCHC: 31.9 g/dL (ref 30.0–36.0)
MCV: 95.5 fL (ref 80.0–100.0)
Monocytes Absolute: 0.6 10*3/uL (ref 0.1–1.0)
Monocytes Relative: 7 %
Neutro Abs: 6.8 10*3/uL (ref 1.7–7.7)
Neutrophils Relative %: 76 %
Platelets: 295 10*3/uL (ref 150–400)
RBC: 4.03 MIL/uL (ref 3.87–5.11)
RDW: 14.3 % (ref 11.5–15.5)
WBC: 9.1 10*3/uL (ref 4.0–10.5)
nRBC: 0 % (ref 0.0–0.2)

## 2019-12-04 LAB — COMPREHENSIVE METABOLIC PANEL
ALT: 16 U/L (ref 0–44)
AST: 18 U/L (ref 15–41)
Albumin: 3.9 g/dL (ref 3.5–5.0)
Alkaline Phosphatase: 39 U/L (ref 38–126)
Anion gap: 10 (ref 5–15)
BUN: 24 mg/dL — ABNORMAL HIGH (ref 8–23)
CO2: 23 mmol/L (ref 22–32)
Calcium: 9.1 mg/dL (ref 8.9–10.3)
Chloride: 105 mmol/L (ref 98–111)
Creatinine, Ser: 1.69 mg/dL — ABNORMAL HIGH (ref 0.44–1.00)
GFR calc Af Amer: 33 mL/min — ABNORMAL LOW (ref >60)
GFR calc non Af Amer: 29 mL/min — ABNORMAL LOW (ref >60)
Glucose, Bld: 104 mg/dL — ABNORMAL HIGH (ref 70–99)
Potassium: 4.8 mmol/L (ref 3.5–5.1)
Sodium: 138 mmol/L (ref 135–145)
Total Bilirubin: 0.5 mg/dL (ref 0.3–1.2)
Total Protein: 7.2 g/dL (ref 6.5–8.1)

## 2019-12-04 NOTE — Progress Notes (Signed)
Patient here to establish care. Pt's husband states they were referred by Dr. Dorthula Perfect. Pt reports not eating well and therefore has lost weight these past few months.

## 2019-12-04 NOTE — Progress Notes (Signed)
Hematology/Oncology Consult note [facility] Telephone:([phone] [phone]   Patient Care Team: Ezequiel Kayser, MD as PCP - General (Internal Medicine)  REFERRING PROVIDER: Ezequiel Kayser, MD  CHIEF COMPLAINTS/REASON FOR VISIT:  Evaluation of hypercalcemia  HISTORY OF PRESENTING ILLNESS:   Laura Bishop is a  79 y.o.  female with PMH listed below was seen in consultation at the request of  Ezequiel Kayser, MD  for evaluation of hypercalcemia Patient is a poor historian.  Most history was obtained from her husband. Patient reports very fatigued, lack of appetite and some weight loss recently. She went to [facility] emergency room 11/09/2019 due to her back and shoulder pain. There is some level was 12.5, and was thought to be secondary to dehydration.  Patient followed up with primary care provider Dr.Thies. 11/17/2019, repeat calcium level 13.2.,  Patient work-up showed vitamin D 25 hydroxy normal at 41.  PTH level normal at 36.  Ionized calcium level was at 6.4. Patient also has a history of creatinine at baseline around 1.3-1.7.  Patient was advised to stop calcium supplementation as well as vitamin D supplementation.  Referred to establish care with hematology for further evaluation.   Review of Systems  Constitutional: Positive for appetite change, fatigue and unexpected weight change. Negative for chills and fever.  HENT:   Negative for hearing loss and voice change.   Eyes: Negative for eye problems.  Respiratory: Negative for chest tightness and cough.   Cardiovascular: Negative for chest pain.  Gastrointestinal: Negative for abdominal distention, abdominal pain and blood in stool.  Endocrine: Negative for hot flashes.  Genitourinary: Negative for difficulty urinating and frequency.   Musculoskeletal: Positive for back pain. Negative for arthralgias.       Left shoulder pain  Neurological: Negative for extremity weakness.  Hematological: Negative for  adenopathy.  Psychiatric/Behavioral: Negative for confusion.    MEDICAL HISTORY:  Past Medical History:  Diagnosis Date  . Chronic lower back pain   . Hyperlipidemia   . Hypertension   . Hypothyroidism   . IDA (iron deficiency anemia)   . Osteoarthritis of both knees   . Osteoporosis   . Overactive bladder   . Vitamin D deficiency     SURGICAL HISTORY: Past Surgical History:  Procedure Laterality Date  . ABDOMINAL HYSTERECTOMY    . BACK SURGERY    . COLONOSCOPY WITH PROPOFOL N/A 07/20/2016   Procedure: COLONOSCOPY WITH PROPOFOL;  Surgeon: Manya Silvas, MD;  Location: [facility] ENDOSCOPY;  Service: Endoscopy;  Laterality: N/A;  . COLONOSCOPY WITH PROPOFOL N/A 02/27/2019   Procedure: COLONOSCOPY WITH PROPOFOL;  Surgeon: Manya Silvas, MD;  Location: [facility] ENDOSCOPY;  Service: Endoscopy;  Laterality: N/A;  . ESOPHAGOGASTRODUODENOSCOPY (EGD) WITH PROPOFOL N/A 07/20/2016   Procedure: ESOPHAGOGASTRODUODENOSCOPY (EGD) WITH PROPOFOL;  Surgeon: Manya Silvas, MD;  Location: [facility] ENDOSCOPY;  Service: Endoscopy;  Laterality: N/A;    SOCIAL HISTORY: Social History   Socioeconomic History  . Marital status: Married    Spouse name: Not on file  . Number of children: Not on file  . Years of education: Not on file  . Highest education level: Not on file  Occupational History  . Not on file  Tobacco Use  . Smoking status: Never Smoker  . Smokeless tobacco: Never Used  Substance and Sexual Activity  . Alcohol use: No  . Drug use: No  . Sexual activity: Not Currently  Other Topics Concern  . Not on file  Social History Narrative  .  Not on file   Social Determinants of Health   Financial Resource Strain:   . Difficulty of Paying Living Expenses: Not on file  Food Insecurity:   . Worried About Charity fundraiser in the Last Year: Not on file  . Ran Out of Food in the Last Year: Not on file  Transportation Needs:   . Lack of Transportation (Medical): Not on file  . Lack of  Transportation (Non-Medical): Not on file  Physical Activity:   . Days of Exercise per Week: Not on file  . Minutes of Exercise per Session: Not on file  Stress:   . Feeling of Stress : Not on file  Social Connections:   . Frequency of Communication with Friends and Family: Not on file  . Frequency of Social Gatherings with Friends and Family: Not on file  . Attends Religious Services: Not on file  . Active Member of Clubs or Organizations: Not on file  . Attends Archivist Meetings: Not on file  . Marital Status: Not on file  Intimate Partner Violence:   . Fear of Current or Ex-Partner: Not on file  . Emotionally Abused: Not on file  . Physically Abused: Not on file  . Sexually Abused: Not on file    FAMILY HISTORY: Family History  Problem Relation Age of Onset  . Stomach cancer Mother   . Lung cancer Father     ALLERGIES:  is allergic to other; estrogens; and nsaids.  MEDICATIONS:  Current Outpatient Medications  Medication Sig Dispense Refill  . amLODipine (NORVASC) 5 MG tablet Take 10 mg by mouth daily.     Marland Kitchen aspirin 81 MG tablet Take 81 mg by mouth daily.     Marland Kitchen levothyroxine (SYNTHROID, LEVOTHROID) 75 MCG tablet Take 75 mcg by mouth daily before breakfast.    . Multiple Vitamin (MULTIVITAMIN) tablet Take 1 tablet by mouth daily.    Marland Kitchen oxybutynin (DITROPAN) 5 MG tablet Take 5 mg by mouth 2 (two) times daily.    . pravastatin (PRAVACHOL) 80 MG tablet Take 80 mg by mouth at bedtime.     Marland Kitchen acetaminophen (TYLENOL) 500 MG tablet Take by mouth.    . benzonatate (TESSALON) 200 MG capsule Take 1 capsule (200 mg total) by mouth 3 (three) times daily as needed. (Patient not taking: Reported on 12/04/2019) 30 capsule 0  . calcium carbonate (OSCAL) 1500 (600 Ca) MG TABS tablet Take 600 mg of elemental calcium by mouth daily with breakfast.     . chlorhexidine (PERIDEX) 0.12 % solution chlorhexidine gluconate 0.12 % mouthwash  SWISH AND SPIT WITH 1 CAPSUL FOR 30 SEC 3 TIMES  DAILY    . ferrous sulfate 325 (65 FE) MG EC tablet Take 325 mg by mouth daily.    . hydrochlorothiazide (HYDRODIURIL) 12.5 MG tablet hydrochlorothiazide 12.5 mg tablet    . losartan (COZAAR) 25 MG tablet losartan 25 mg tablet  TAKE 1 TABLET BY MOUTH EVERY DAY    . ranitidine (ZANTAC) 75 MG tablet Take 75 mg by mouth 2 (two) times daily as needed for heartburn.     Marland Kitchen SALONPAS PAIN RELIEVING 4 % PTCH Apply 1 patch topically daily.     No current facility-administered medications for this visit.     PHYSICAL EXAMINATION: ECOG PERFORMANCE STATUS: 2 - Symptomatic, <50% confined to bed Vitals:   12/04/19 1022  BP: 121/78  Pulse: 80  Resp: 18  Temp: 98 F (36.7 C)   Filed Weights   12/04/19  1022  Weight: 128 lb 12.8 oz (58.4 kg)    Physical Exam Constitutional:      General: She is not in acute distress. HENT:     Head: Normocephalic and atraumatic.  Eyes:     General: No scleral icterus. Cardiovascular:     Rate and Rhythm: Normal rate and regular rhythm.     Heart sounds: Normal heart sounds.  Pulmonary:     Effort: Pulmonary effort is normal. No respiratory distress.     Breath sounds: No wheezing.  Abdominal:     General: Bowel sounds are normal. There is no distension.     Palpations: Abdomen is soft.  Musculoskeletal:        General: No deformity. Normal range of motion.     Cervical back: Normal range of motion and neck supple.  Skin:    General: Skin is warm and dry.     Findings: No erythema or rash.  Neurological:     General: No focal deficit present.     Mental Status: She is alert. Mental status is at baseline.  Psychiatric:        Mood and Affect: Mood normal.     LABORATORY DATA:  I have reviewed the data as listed Lab Results  Component Value Date   WBC 9.1 12/04/2019   HGB 12.3 12/04/2019   HCT 38.5 12/04/2019   MCV 95.5 12/04/2019   PLT 295 12/04/2019   Recent Labs    12/04/19 1115  NA 138  K 4.8  CL 105  CO2 23  GLUCOSE 104*  BUN  24*  CREATININE 1.69*  CALCIUM 9.1  GFRNONAA 29*  GFRAA 33*  PROT 7.2  ALBUMIN 3.9  AST 18  ALT 16  ALKPHOS 39  BILITOT 0.5   Iron/TIBC/Ferritin/ %Sat No results found for: IRON, TIBC, FERRITIN, IRONPCTSAT    RADIOGRAPHIC STUDIES: I have personally reviewed the radiological images as listed and agreed with the findings in the report.  No results found.    ASSESSMENT & PLAN:  1. Hypercalcemia   2. Stage 4 chronic kidney disease ([place])    Labs reviewed and discussed with patient and her husband. Hypercalcemia, with normal PTH, 25-hydroxy vitamin D level. I agree with stopping calcium supplementation and vitamin D supplementation for now. Check PTH rp, 1, 25 hydroxy vitamin D level.  Repeat CBC and CMP today. I will also check multiple myeloma panel, free light chain ratio. Discussed with patient that further management pending on above lab results.  Orders Placed This Encounter  Procedures  . PTH-related peptide    Standing Status:   Future    Number of Occurrences:   1    Standing Expiration Date:   12/03/2020  . Multiple Myeloma Panel (SPEP&IFE w/QIG)    Standing Status:   Future    Number of Occurrences:   1    Standing Expiration Date:   12/03/2020  . CBC with Differential/Platelet    Standing Status:   Future    Number of Occurrences:   1    Standing Expiration Date:   12/03/2020  . Comprehensive metabolic panel    Standing Status:   Future    Number of Occurrences:   1    Standing Expiration Date:   12/03/2020  . Kappa/lambda light chains    Standing Status:   Future    Number of Occurrences:   1    Standing Expiration Date:   12/03/2020  . Vitamin D 1,25 dihydroxy  Standing Status:   Future    Number of Occurrences:   1    Standing Expiration Date:   12/03/2020    All questions were answered. The patient knows to call the clinic with any problems questions or concerns. cc  Ezequiel Kayser, MD    Return of visit: Patient will follow up in 1 week blood  work results. Thank you for this kind referral and the opportunity to participate in the care of this patient. A copy of today's note is routed to referring provider      Earlie Server, MD, PhD Hematology Oncology [facility] Pager- [phone] 12/04/2019

## 2019-12-05 LAB — KAPPA/LAMBDA LIGHT CHAINS
Kappa free light chain: 18.7 mg/L (ref 3.3–19.4)
Kappa, lambda light chain ratio: 0.97 (ref 0.26–1.65)
Lambda free light chains: 19.2 mg/L (ref 5.7–26.3)

## 2019-12-06 LAB — MULTIPLE MYELOMA PANEL, SERUM
Albumin SerPl Elph-Mcnc: 3.6 g/dL (ref 2.9–4.4)
Albumin/Glob SerPl: 1.4 (ref 0.7–1.7)
Alpha 1: 0.2 g/dL (ref 0.0–0.4)
Alpha2 Glob SerPl Elph-Mcnc: 0.8 g/dL (ref 0.4–1.0)
B-Globulin SerPl Elph-Mcnc: 0.9 g/dL (ref 0.7–1.3)
Gamma Glob SerPl Elph-Mcnc: 0.8 g/dL (ref 0.4–1.8)
Globulin, Total: 2.7 g/dL (ref 2.2–3.9)
IgA: 212 mg/dL (ref 64–422)
IgG (Immunoglobin G), Serum: 899 mg/dL (ref 586–1602)
IgM (Immunoglobulin M), Srm: 79 mg/dL (ref 26–217)
Total Protein ELP: 6.3 g/dL (ref 6.0–8.5)

## 2019-12-07 LAB — VITAMIN D 1,25 DIHYDROXY
Vitamin D 1, 25 (OH)2 Total: 61 pg/mL
Vitamin D2 1, 25 (OH)2: <10 pg/mL
Vitamin D3 1, 25 (OH)2: 61 pg/mL

## 2019-12-12 LAB — PTH-RELATED PEPTIDE: PTH-related peptide: <2 pmol/L

## 2019-12-15 ENCOUNTER — Other Ambulatory Visit: Payer: Self-pay

## 2019-12-15 ENCOUNTER — Encounter: Payer: Self-pay | Admitting: Oncology

## 2019-12-15 ENCOUNTER — Inpatient Hospital Stay: Payer: Medicare HMO | Attending: Oncology | Admitting: Oncology

## 2019-12-15 DIAGNOSIS — Z7982 Long term (current) use of aspirin: Secondary | ICD-10-CM | POA: Insufficient documentation

## 2019-12-15 DIAGNOSIS — Z79899 Other long term (current) drug therapy: Secondary | ICD-10-CM | POA: Insufficient documentation

## 2019-12-15 DIAGNOSIS — R634 Abnormal weight loss: Secondary | ICD-10-CM | POA: Diagnosis not present

## 2019-12-15 DIAGNOSIS — E039 Hypothyroidism, unspecified: Secondary | ICD-10-CM | POA: Insufficient documentation

## 2019-12-15 DIAGNOSIS — I129 Hypertensive chronic kidney disease with stage 1 through stage 4 chronic kidney disease, or unspecified chronic kidney disease: Secondary | ICD-10-CM | POA: Diagnosis not present

## 2019-12-15 DIAGNOSIS — E785 Hyperlipidemia, unspecified: Secondary | ICD-10-CM | POA: Diagnosis not present

## 2019-12-15 DIAGNOSIS — N184 Chronic kidney disease, stage 4 (severe): Secondary | ICD-10-CM | POA: Diagnosis not present

## 2019-12-15 DIAGNOSIS — M810 Age-related osteoporosis without current pathological fracture: Secondary | ICD-10-CM | POA: Diagnosis not present

## 2019-12-15 DIAGNOSIS — D509 Iron deficiency anemia, unspecified: Secondary | ICD-10-CM | POA: Diagnosis not present

## 2019-12-15 NOTE — Progress Notes (Signed)
Patient here for follow up. No new concerns voied.

## 2019-12-16 NOTE — Progress Notes (Signed)
Hematology/Oncology Consult note [facility] Telephone:([phone] [phone]   Patient Care Team: Ezequiel Kayser, MD as PCP - General (Internal Medicine)  REFERRING PROVIDER: Ezequiel Kayser, MD  CHIEF COMPLAINTS/REASON FOR VISIT:  Follow-up for  hypercalcemia  HISTORY OF PRESENTING ILLNESS:   Laura Bishop is a  79 y.o.  female with PMH listed below was seen in consultation at the request of  Ezequiel Kayser, MD  for evaluation of hypercalcemia Patient is a poor historian.  Most history was obtained from her husband. Patient reports very fatigued, lack of appetite and some weight loss recently. She went to [facility] emergency room 11/09/2019 due to her back and shoulder pain. There is some level was 12.5, and was thought to be secondary to dehydration.  Patient followed up with primary care provider Dr.Thies. 11/17/2019, repeat calcium level 13.2.,  Patient work-up showed vitamin D 25 hydroxy normal at 41.  PTH level normal at 36.  Ionized calcium level was at 6.4. Patient also has a history of creatinine at baseline around 1.3-1.7.  Patient was advised to stop calcium supplementation as well as vitamin D supplementation.  Referred to establish care with hematology for further evaluation.  INTERVAL HISTORY Laura Bishop is a 79 y.o. female who has above history reviewed by me today presents for follow up visit for management of  Problems and complaints are listed below: Patient had blood work done recently and presents to discuss results. She was accompanied by her husband. Patient appears to be more interactive today she answers simple questions.  Some of the history was obtained from husband. Her husband, patient weight has been relatively stable for the past 1 months.  She appears to be doing better.   Review of Systems  Constitutional: Positive for appetite change, fatigue and unexpected weight change. Negative for chills and fever.  HENT:   Negative for  hearing loss and voice change.   Eyes: Negative for eye problems.  Respiratory: Negative for chest tightness and cough.   Cardiovascular: Negative for chest pain.  Gastrointestinal: Negative for abdominal distention, abdominal pain and blood in stool.  Endocrine: Negative for hot flashes.  Genitourinary: Negative for difficulty urinating and frequency.   Musculoskeletal: Positive for back pain. Negative for arthralgias.       Left shoulder pain  Skin: Negative for itching and rash.  Neurological: Negative for extremity weakness.  Hematological: Negative for adenopathy.  Psychiatric/Behavioral: Negative for confusion.    MEDICAL HISTORY:  Past Medical History:  Diagnosis Date  . Chronic lower back pain   . Hyperlipidemia   . Hypertension   . Hypothyroidism   . IDA (iron deficiency anemia)   . Osteoarthritis of both knees   . Osteoporosis   . Overactive bladder   . Vitamin D deficiency     SURGICAL HISTORY: Past Surgical History:  Procedure Laterality Date  . ABDOMINAL HYSTERECTOMY    . BACK SURGERY    . COLONOSCOPY WITH PROPOFOL N/A 07/20/2016   Procedure: COLONOSCOPY WITH PROPOFOL;  Surgeon: Manya Silvas, MD;  Location: [facility] ENDOSCOPY;  Service: Endoscopy;  Laterality: N/A;  . COLONOSCOPY WITH PROPOFOL N/A 02/27/2019   Procedure: COLONOSCOPY WITH PROPOFOL;  Surgeon: Manya Silvas, MD;  Location: [facility] ENDOSCOPY;  Service: Endoscopy;  Laterality: N/A;  . ESOPHAGOGASTRODUODENOSCOPY (EGD) WITH PROPOFOL N/A 07/20/2016   Procedure: ESOPHAGOGASTRODUODENOSCOPY (EGD) WITH PROPOFOL;  Surgeon: Manya Silvas, MD;  Location: [facility] ENDOSCOPY;  Service: Endoscopy;  Laterality: N/A;    SOCIAL HISTORY: Social History  Socioeconomic History  . Marital status: Married    Spouse name: Not on file  . Number of children: Not on file  . Years of education: Not on file  . Highest education level: Not on file  Occupational History  . Not on file  Tobacco Use  . Smoking status:  Never Smoker  . Smokeless tobacco: Never Used  Substance and Sexual Activity  . Alcohol use: No  . Drug use: No  . Sexual activity: Not Currently  Other Topics Concern  . Not on file  Social History Narrative  . Not on file   Social Determinants of Health   Financial Resource Strain:   . Difficulty of Paying Living Expenses: Not on file  Food Insecurity:   . Worried About Charity fundraiser in the Last Year: Not on file  . Ran Out of Food in the Last Year: Not on file  Transportation Needs:   . Lack of Transportation (Medical): Not on file  . Lack of Transportation (Non-Medical): Not on file  Physical Activity:   . Days of Exercise per Week: Not on file  . Minutes of Exercise per Session: Not on file  Stress:   . Feeling of Stress : Not on file  Social Connections:   . Frequency of Communication with Friends and Family: Not on file  . Frequency of Social Gatherings with Friends and Family: Not on file  . Attends Religious Services: Not on file  . Active Member of Clubs or Organizations: Not on file  . Attends Archivist Meetings: Not on file  . Marital Status: Not on file  Intimate Partner Violence:   . Fear of Current or Ex-Partner: Not on file  . Emotionally Abused: Not on file  . Physically Abused: Not on file  . Sexually Abused: Not on file    FAMILY HISTORY: Family History  Problem Relation Age of Onset  . Stomach cancer Mother   . Lung cancer Father     ALLERGIES:  is allergic to other; estrogens; and nsaids.  MEDICATIONS:  Current Outpatient Medications  Medication Sig Dispense Refill  . acetaminophen (TYLENOL) 500 MG tablet Take by mouth.    Marland Kitchen amLODipine (NORVASC) 5 MG tablet Take 10 mg by mouth daily.     Marland Kitchen aspirin 81 MG tablet Take 81 mg by mouth daily.     . chlorhexidine (PERIDEX) 0.12 % solution chlorhexidine gluconate 0.12 % mouthwash  SWISH AND SPIT WITH 1 CAPSUL FOR 30 SEC 3 TIMES DAILY    . levothyroxine (SYNTHROID, LEVOTHROID) 75  MCG tablet Take 75 mcg by mouth daily before breakfast.    . losartan (COZAAR) 25 MG tablet losartan 25 mg tablet  TAKE 1 TABLET BY MOUTH EVERY DAY    . Multiple Vitamin (MULTIVITAMIN) tablet Take 1 tablet by mouth daily.    Marland Kitchen oxybutynin (DITROPAN) 5 MG tablet Take 5 mg by mouth 2 (two) times daily.    Marland Kitchen SALONPAS PAIN RELIEVING 4 % PTCH Apply 1 patch topically daily.    . benzonatate (TESSALON) 200 MG capsule Take 1 capsule (200 mg total) by mouth 3 (three) times daily as needed. (Patient not taking: Reported on 12/15/2019) 30 capsule 0  . calcium carbonate (OSCAL) 1500 (600 Ca) MG TABS tablet Take 600 mg of elemental calcium by mouth daily with breakfast.     . ferrous sulfate 325 (65 FE) MG EC tablet Take 325 mg by mouth daily.    . hydrochlorothiazide (HYDRODIURIL) 12.5 MG tablet  hydrochlorothiazide 12.5 mg tablet    . pravastatin (PRAVACHOL) 80 MG tablet Take 80 mg by mouth at bedtime.     . ranitidine (ZANTAC) 75 MG tablet Take 75 mg by mouth 2 (two) times daily as needed for heartburn.      No current facility-administered medications for this visit.     PHYSICAL EXAMINATION: ECOG PERFORMANCE STATUS: 2 - Symptomatic, <50% confined to bed Vitals:   12/15/19 1357  BP: (!) 148/89  Pulse: 80  Resp: 18  Temp: 97.6 F (36.4 C)  SpO2: 100%   Filed Weights   12/15/19 1357  Weight: 127 lb 8 oz (57.8 kg)    Physical Exam Constitutional:      General: She is not in acute distress. HENT:     Head: Normocephalic and atraumatic.  Eyes:     General: No scleral icterus. Cardiovascular:     Rate and Rhythm: Normal rate and regular rhythm.     Heart sounds: Normal heart sounds.  Pulmonary:     Effort: Pulmonary effort is normal. No respiratory distress.     Breath sounds: No wheezing.  Abdominal:     General: Bowel sounds are normal. There is no distension.     Palpations: Abdomen is soft.  Musculoskeletal:        General: No deformity. Normal range of motion.     Cervical back:  Normal range of motion and neck supple.  Skin:    General: Skin is warm and dry.     Findings: No erythema or rash.  Neurological:     General: No focal deficit present.     Mental Status: She is alert and oriented to person, place, and time. Mental status is at baseline.     Cranial Nerves: No cranial nerve deficit.     Coordination: Coordination normal.  Psychiatric:        Mood and Affect: Mood normal.     LABORATORY DATA:  I have reviewed the data as listed Lab Results  Component Value Date   WBC 9.1 12/04/2019   HGB 12.3 12/04/2019   HCT 38.5 12/04/2019   MCV 95.5 12/04/2019   PLT 295 12/04/2019   Recent Labs    12/04/19 1115  NA 138  K 4.8  CL 105  CO2 23  GLUCOSE 104*  BUN 24*  CREATININE 1.69*  CALCIUM 9.1  GFRNONAA 29*  GFRAA 33*  PROT 7.2  ALBUMIN 3.9  AST 18  ALT 16  ALKPHOS 39  BILITOT 0.5   Iron/TIBC/Ferritin/ %Sat No results found for: IRON, TIBC, FERRITIN, IRONPCTSAT    RADIOGRAPHIC STUDIES: I have personally reviewed the radiological images as listed and agreed with the findings in the report.  No results found.    ASSESSMENT & PLAN:  1. Hypercalcemia   2. Stage 4 chronic kidney disease ([place])   3. Weight loss    #Hypercalcemia, Labs are reviewed and discussed with patient. Normal PTH, 25-hydroxy vitamin D level, no M protein on multiple myeloma panel. Negative PTH RP, normal 1,25 hydroxy vitamin D level  12/04/2019 calcium level appears to be normal at 9.1 Patient has stopped calcium and vitamin D supplementation. I wonder her previous hypercalcemia was may be secondary to dehydration. For now I will hold off additional work-up for hypercalcemia.  Continue to monitor.  #Weight loss.  Per husband, patient has had gradually lost weight over the past few years.  More recently her weight has been stable.  Continue monitor.  If continues to have  weight loss, will obtain CT chest abdomen pelvis.  #CKD, avoid nephrotoxins.  All questions  were answered. The patient knows to call the clinic with any problems questions or concerns.   Return of visit: 1 month     Earlie Server, MD, PhD Hematology Oncology [facility] at [facility] Pager- [phone] 12/16/2019

## 2020-01-10 ENCOUNTER — Other Ambulatory Visit: Payer: Self-pay

## 2020-01-10 DIAGNOSIS — R634 Abnormal weight loss: Secondary | ICD-10-CM

## 2020-01-11 ENCOUNTER — Inpatient Hospital Stay: Payer: Medicare HMO | Attending: Oncology

## 2020-01-11 ENCOUNTER — Other Ambulatory Visit: Payer: Self-pay

## 2020-01-11 ENCOUNTER — Encounter: Payer: Self-pay | Admitting: Oncology

## 2020-01-11 ENCOUNTER — Inpatient Hospital Stay (HOSPITAL_BASED_OUTPATIENT_CLINIC_OR_DEPARTMENT_OTHER): Payer: Medicare HMO | Admitting: Oncology

## 2020-01-11 VITALS — BP 136/84 | HR 75 | Temp 97.9°F | Resp 18 | Wt 122.9 lb

## 2020-01-11 DIAGNOSIS — G8929 Other chronic pain: Secondary | ICD-10-CM | POA: Insufficient documentation

## 2020-01-11 DIAGNOSIS — E039 Hypothyroidism, unspecified: Secondary | ICD-10-CM | POA: Diagnosis not present

## 2020-01-11 DIAGNOSIS — M545 Low back pain: Secondary | ICD-10-CM | POA: Diagnosis not present

## 2020-01-11 DIAGNOSIS — Z79899 Other long term (current) drug therapy: Secondary | ICD-10-CM | POA: Diagnosis not present

## 2020-01-11 DIAGNOSIS — M170 Bilateral primary osteoarthritis of knee: Secondary | ICD-10-CM | POA: Diagnosis not present

## 2020-01-11 DIAGNOSIS — N184 Chronic kidney disease, stage 4 (severe): Secondary | ICD-10-CM | POA: Diagnosis not present

## 2020-01-11 DIAGNOSIS — I129 Hypertensive chronic kidney disease with stage 1 through stage 4 chronic kidney disease, or unspecified chronic kidney disease: Secondary | ICD-10-CM | POA: Insufficient documentation

## 2020-01-11 DIAGNOSIS — E559 Vitamin D deficiency, unspecified: Secondary | ICD-10-CM | POA: Insufficient documentation

## 2020-01-11 DIAGNOSIS — R634 Abnormal weight loss: Secondary | ICD-10-CM

## 2020-01-11 DIAGNOSIS — Z7982 Long term (current) use of aspirin: Secondary | ICD-10-CM | POA: Diagnosis not present

## 2020-01-11 DIAGNOSIS — E785 Hyperlipidemia, unspecified: Secondary | ICD-10-CM | POA: Insufficient documentation

## 2020-01-11 DIAGNOSIS — M810 Age-related osteoporosis without current pathological fracture: Secondary | ICD-10-CM | POA: Insufficient documentation

## 2020-01-11 DIAGNOSIS — R5383 Other fatigue: Secondary | ICD-10-CM | POA: Diagnosis not present

## 2020-01-11 DIAGNOSIS — R630 Anorexia: Secondary | ICD-10-CM | POA: Insufficient documentation

## 2020-01-11 LAB — CBC WITH DIFFERENTIAL/PLATELET
Abs Immature Granulocytes: 0.07 10*3/uL (ref 0.00–0.07)
Basophils Absolute: 0.1 10*3/uL (ref 0.0–0.1)
Basophils Relative: 1 %
Eosinophils Absolute: 0.1 10*3/uL (ref 0.0–0.5)
Eosinophils Relative: 1 %
HCT: 37.4 % (ref 36.0–46.0)
Hemoglobin: 12.5 g/dL (ref 12.0–15.0)
Immature Granulocytes: 1 %
Lymphocytes Relative: 23 %
Lymphs Abs: 1.7 10*3/uL (ref 0.7–4.0)
MCH: 30.8 pg (ref 26.0–34.0)
MCHC: 33.4 g/dL (ref 30.0–36.0)
MCV: 92.1 fL (ref 80.0–100.0)
Monocytes Absolute: 0.6 10*3/uL (ref 0.1–1.0)
Monocytes Relative: 8 %
Neutro Abs: 5 10*3/uL (ref 1.7–7.7)
Neutrophils Relative %: 66 %
Platelets: 289 10*3/uL (ref 150–400)
RBC: 4.06 MIL/uL (ref 3.87–5.11)
RDW: 12.7 % (ref 11.5–15.5)
WBC: 7.6 10*3/uL (ref 4.0–10.5)
nRBC: 0 % (ref 0.0–0.2)

## 2020-01-11 LAB — COMPREHENSIVE METABOLIC PANEL
ALT: 15 U/L (ref 0–44)
AST: 15 U/L (ref 15–41)
Albumin: 3.8 g/dL (ref 3.5–5.0)
Alkaline Phosphatase: 39 U/L (ref 38–126)
Anion gap: 10 (ref 5–15)
BUN: 29 mg/dL — ABNORMAL HIGH (ref 8–23)
CO2: 24 mmol/L (ref 22–32)
Calcium: 10 mg/dL (ref 8.9–10.3)
Chloride: 104 mmol/L (ref 98–111)
Creatinine, Ser: 1.58 mg/dL — ABNORMAL HIGH (ref 0.44–1.00)
GFR calc Af Amer: 36 mL/min — ABNORMAL LOW (ref >60)
GFR calc non Af Amer: 31 mL/min — ABNORMAL LOW (ref >60)
Glucose, Bld: 91 mg/dL (ref 70–99)
Potassium: 4.8 mmol/L (ref 3.5–5.1)
Sodium: 138 mmol/L (ref 135–145)
Total Bilirubin: 0.6 mg/dL (ref 0.3–1.2)
Total Protein: 7 g/dL (ref 6.5–8.1)

## 2020-01-11 NOTE — Progress Notes (Signed)
Patient here for follow up. Patient's husband reports that patient is not eating good.

## 2020-01-11 NOTE — Progress Notes (Signed)
Hematology/Oncology Consult note [facility] [phone] [phone]   Patient Care Team: Ezequiel Kayser, MD as PCP - General (Internal Medicine)  REFERRING PROVIDER: Ezequiel Kayser, MD  CHIEF COMPLAINTS/REASON FOR VISIT:  Follow-up for  hypercalcemia  HISTORY OF PRESENTING ILLNESS:   Laura Bishop is a  79 y.o.  female with PMH listed below was seen in consultation at the request of  Ezequiel Kayser, MD  for evaluation of hypercalcemia Patient is a poor historian.  Most history was obtained from her husband. Patient reports very fatigued, lack of appetite and some weight loss recently. She went to [facility] emergency room 11/09/2019 due to her back and shoulder pain. There is some level was 12.5, and was thought to be secondary to dehydration.  Patient followed up with primary care provider Dr.Thies. 11/17/2019, repeat calcium level 13.2.,  Patient work-up showed vitamin D 25 hydroxy normal at 41.  PTH level normal at 36.  Ionized calcium level was at 6.4. Patient also has a history of creatinine at baseline around 1.3-1.7.  Patient was advised to stop calcium supplementation as well as vitamin D supplementation.  Referred to establish care with hematology for further evaluation.  INTERVAL HISTORY Laura Bishop is a 78 y.o. female who has above history reviewed by me today presents for follow up visit for management of  Problems and complaints are listed below: Patient was accompanied by her husband. Continue to have poor appetite, not eating well. She lost 5 pounds since last visit 4 weeks ago.  No fever, chills, night sweats. Left shoulder blade pain. Chronic.   Review of Systems  Constitutional: Positive for appetite change, fatigue and unexpected weight change. Negative for chills and fever.  HENT:   Negative for hearing loss and voice change.   Eyes: Negative for eye problems.  Respiratory: Negative for chest tightness and cough.     Cardiovascular: Negative for chest pain.  Gastrointestinal: Negative for abdominal distention, abdominal pain and blood in stool.  Endocrine: Negative for hot flashes.  Genitourinary: Negative for difficulty urinating and frequency.   Musculoskeletal: Negative for arthralgias and back pain.       Left shoulder pain  Skin: Negative for itching and rash.  Neurological: Negative for extremity weakness.  Hematological: Negative for adenopathy.  Psychiatric/Behavioral: Negative for confusion.    MEDICAL HISTORY:  Past Medical History:  Diagnosis Date  . Chronic lower back pain   . Hyperlipidemia   . Hypertension   . Hypothyroidism   . IDA (iron deficiency anemia)   . Osteoarthritis of both knees   . Osteoporosis   . Overactive bladder   . Vitamin D deficiency     SURGICAL HISTORY: Past Surgical History:  Procedure Laterality Date  . ABDOMINAL HYSTERECTOMY    . BACK SURGERY    . COLONOSCOPY WITH PROPOFOL N/A 07/20/2016   Procedure: COLONOSCOPY WITH PROPOFOL;  Surgeon: Manya Silvas, MD;  Location: [facility] ENDOSCOPY;  Service: Endoscopy;  Laterality: N/A;  . COLONOSCOPY WITH PROPOFOL N/A 02/27/2019   Procedure: COLONOSCOPY WITH PROPOFOL;  Surgeon: Manya Silvas, MD;  Location: [facility] ENDOSCOPY;  Service: Endoscopy;  Laterality: N/A;  . ESOPHAGOGASTRODUODENOSCOPY (EGD) WITH PROPOFOL N/A 07/20/2016   Procedure: ESOPHAGOGASTRODUODENOSCOPY (EGD) WITH PROPOFOL;  Surgeon: Manya Silvas, MD;  Location: [facility] ENDOSCOPY;  Service: Endoscopy;  Laterality: N/A;    SOCIAL HISTORY: Social History   Socioeconomic History  . Marital status: Married    Spouse name: Not on file  . Number of children: Not  on file  . Years of education: Not on file  . Highest education level: Not on file  Occupational History  . Not on file  Tobacco Use  . Smoking status: Never Smoker  . Smokeless tobacco: Never Used  Substance and Sexual Activity  . Alcohol use: No  . Drug use: No  . Sexual  activity: Not Currently  Other Topics Concern  . Not on file  Social History Narrative  . Not on file   Social Determinants of Health   Financial Resource Strain:   . Difficulty of Paying Living Expenses:   Food Insecurity:   . Worried About Charity fundraiser in the Last Year:   . Arboriculturist in the Last Year:   Transportation Needs:   . Film/video editor (Medical):   Marland Kitchen Lack of Transportation (Non-Medical):   Physical Activity:   . Days of Exercise per Week:   . Minutes of Exercise per Session:   Stress:   . Feeling of Stress :   Social Connections:   . Frequency of Communication with Friends and Family:   . Frequency of Social Gatherings with Friends and Family:   . Attends Religious Services:   . Active Member of Clubs or Organizations:   . Attends Archivist Meetings:   Marland Kitchen Marital Status:   Intimate Partner Violence:   . Fear of Current or Ex-Partner:   . Emotionally Abused:   Marland Kitchen Physically Abused:   . Sexually Abused:     FAMILY HISTORY: Family History  Problem Relation Age of Onset  . Stomach cancer Mother   . Lung cancer Father     ALLERGIES:  is allergic to other; estrogens; and nsaids.  MEDICATIONS:  Current Outpatient Medications  Medication Sig Dispense Refill  . acetaminophen (TYLENOL) 500 MG tablet Take by mouth.    Marland Kitchen amLODipine (NORVASC) 5 MG tablet Take 10 mg by mouth daily.     Marland Kitchen aspirin 81 MG tablet Take 81 mg by mouth daily.     . chlorhexidine (PERIDEX) 0.12 % solution chlorhexidine gluconate 0.12 % mouthwash  SWISH AND SPIT WITH 1 CAPSUL FOR 30 SEC 3 TIMES DAILY    . levothyroxine (SYNTHROID, LEVOTHROID) 75 MCG tablet Take 75 mcg by mouth daily before breakfast.    . losartan (COZAAR) 25 MG tablet losartan 25 mg tablet  TAKE 1 TABLET BY MOUTH EVERY DAY    . Multiple Vitamin (MULTIVITAMIN) tablet Take 1 tablet by mouth daily.    Marland Kitchen oxybutynin (DITROPAN) 5 MG tablet Take 5 mg by mouth 2 (two) times daily.    Marland Kitchen SALONPAS PAIN  RELIEVING 4 % PTCH Apply 1 patch topically daily.    . benzonatate (TESSALON) 200 MG capsule Take 1 capsule (200 mg total) by mouth 3 (three) times daily as needed. (Patient not taking: Reported on 12/15/2019) 30 capsule 0  . calcium carbonate (OSCAL) 1500 (600 Ca) MG TABS tablet Take 600 mg of elemental calcium by mouth daily with breakfast.     . ferrous sulfate 325 (65 FE) MG EC tablet Take 325 mg by mouth daily.    . hydrochlorothiazide (HYDRODIURIL) 12.5 MG tablet hydrochlorothiazide 12.5 mg tablet    . pravastatin (PRAVACHOL) 80 MG tablet Take 80 mg by mouth at bedtime.     . ranitidine (ZANTAC) 75 MG tablet Take 75 mg by mouth 2 (two) times daily as needed for heartburn.      No current facility-administered medications for this visit.  PHYSICAL EXAMINATION: ECOG PERFORMANCE STATUS: 2 - Symptomatic, <50% confined to bed Vitals:   01/11/20 1011  BP: 136/84  Pulse: 75  Resp: 18  Temp: 97.9 F (36.6 C)   Filed Weights   01/11/20 1011  Weight: 122 lb 14.4 oz (55.7 kg)    Physical Exam Constitutional:      General: She is not in acute distress. HENT:     Head: Normocephalic and atraumatic.  Eyes:     General: No scleral icterus. Cardiovascular:     Rate and Rhythm: Normal rate and regular rhythm.     Heart sounds: Normal heart sounds.  Pulmonary:     Effort: Pulmonary effort is normal. No respiratory distress.     Breath sounds: No wheezing.  Abdominal:     General: Bowel sounds are normal. There is no distension.     Palpations: Abdomen is soft.  Musculoskeletal:        General: No deformity. Normal range of motion.     Cervical back: Normal range of motion and neck supple.  Skin:    General: Skin is warm and dry.     Findings: No erythema or rash.  Neurological:     General: No focal deficit present.     Mental Status: She is alert and oriented to person, place, and time. Mental status is at baseline.     Cranial Nerves: No cranial nerve deficit.      Coordination: Coordination normal.  Psychiatric:        Mood and Affect: Mood normal.     LABORATORY DATA:  I have reviewed the data as listed Lab Results  Component Value Date   WBC 7.6 01/11/2020   HGB 12.5 01/11/2020   HCT 37.4 01/11/2020   MCV 92.1 01/11/2020   PLT 289 01/11/2020   Recent Labs    12/04/19 1115 01/11/20 0944  NA 138 138  K 4.8 4.8  CL 105 104  CO2 23 24  GLUCOSE 104* 91  BUN 24* 29*  CREATININE 1.69* 1.58*  CALCIUM 9.1 10.0  GFRNONAA 29* 31*  GFRAA 33* 36*  PROT 7.2 7.0  ALBUMIN 3.9 3.8  AST 18 15  ALT 16 15  ALKPHOS 39 39  BILITOT 0.5 0.6   Iron/TIBC/Ferritin/ %Sat No results found for: IRON, TIBC, FERRITIN, IRONPCTSAT    RADIOGRAPHIC STUDIES: I have personally reviewed the radiological images as listed and agreed with the findings in the report. No results found.    ASSESSMENT & PLAN:  1. Weight loss   2. Hypercalcemia   3. Stage 4 chronic kidney disease (HCC)    #Unintentional weight loss, Previous work-up nonrevealing. I recommend to obtain CT chest abdomen pelvis for further evaluation.  #Hypercalcemiaia, Normal PTH, 25-hydroxy vitamin D level, no M protein on multiple myeloma panel. Negative PTH RP, normal 1,25 hydroxy vitamin D level  Labs are reviewed.  Calcium level 10, stable.  #CKD, avoid nephrotoxins.  All questions were answered. The patient knows to call the clinic with any problems questions or concerns.   Return of visit:  after CT scan.     Earlie Server, MD, PhD Hematology Oncology [facility] Pager- [phone] 01/11/2020

## 2020-01-19 ENCOUNTER — Other Ambulatory Visit: Payer: Self-pay

## 2020-01-19 ENCOUNTER — Other Ambulatory Visit: Payer: Self-pay | Admitting: Oncology

## 2020-01-19 ENCOUNTER — Ambulatory Visit
Admission: RE | Admit: 2020-01-19 | Discharge: 2020-01-19 | Disposition: A | Discharge status: Home / Self Care | Payer: Medicare HMO | Source: Ambulatory Visit | Attending: Oncology | Admitting: Oncology

## 2020-01-19 DIAGNOSIS — N289 Disorder of kidney and ureter, unspecified: Secondary | ICD-10-CM | POA: Diagnosis not present

## 2020-01-19 DIAGNOSIS — R634 Abnormal weight loss: Secondary | ICD-10-CM | POA: Diagnosis not present

## 2020-01-19 DIAGNOSIS — K319 Disease of stomach and duodenum, unspecified: Secondary | ICD-10-CM | POA: Insufficient documentation

## 2020-01-19 DIAGNOSIS — R918 Other nonspecific abnormal finding of lung field: Secondary | ICD-10-CM | POA: Diagnosis not present

## 2020-01-22 ENCOUNTER — Encounter: Payer: Self-pay | Admitting: Oncology

## 2020-01-22 ENCOUNTER — Inpatient Hospital Stay: Payer: Medicare HMO | Admitting: Oncology

## 2020-01-22 ENCOUNTER — Other Ambulatory Visit: Payer: Self-pay

## 2020-01-22 VITALS — BP 138/84 | HR 68 | Temp 97.0°F | Wt 121.4 lb

## 2020-01-22 DIAGNOSIS — N2889 Other specified disorders of kidney and ureter: Secondary | ICD-10-CM

## 2020-01-22 DIAGNOSIS — R634 Abnormal weight loss: Secondary | ICD-10-CM | POA: Diagnosis not present

## 2020-01-22 DIAGNOSIS — N184 Chronic kidney disease, stage 4 (severe): Secondary | ICD-10-CM | POA: Diagnosis not present

## 2020-01-22 NOTE — Progress Notes (Signed)
Patient does not offer any problems today.  

## 2020-01-22 NOTE — Progress Notes (Signed)
Hematology/Oncology Consult note [facility] Telephone:([phone] [phone]   Patient Care Team: Ezequiel Kayser, MD as PCP - General (Internal Medicine)  REFERRING PROVIDER: Ezequiel Kayser, MD  CHIEF COMPLAINTS/REASON FOR VISIT:  Follow-up for  hypercalcemia  HISTORY OF PRESENTING ILLNESS:   Laura Bishop is a  79 y.o.  female with PMH listed below was seen in consultation at the request of  Ezequiel Kayser, MD  for evaluation of hypercalcemia Patient is a poor historian.  Most history was obtained from her husband. Patient reports very fatigued, lack of appetite and some weight loss recently. She went to [facility] emergency room 11/09/2019 due to her back and shoulder pain. There is some level was 12.5, and was thought to be secondary to dehydration.  Patient followed up with primary care provider Dr.Thies. 11/17/2019, repeat calcium level 13.2.,  Patient work-up showed vitamin D 25 hydroxy normal at 41.  PTH level normal at 36.  Ionized calcium level was at 6.4. Patient also has a history of creatinine at baseline around 1.3-1.7.  Patient was advised to stop calcium supplementation as well as vitamin D supplementation.  Referred to establish care with hematology for further evaluation.  INTERVAL HISTORY Laura Bishop is a 79 y.o. female who has above history reviewed by me today presents for follow up visit for management of weight loss, hypercalcemia. Problems and complaints are listed below: Patient was accompanied by her husband. Appetite continues to be not very good.  Lost 1 pound since last visit. Patient had CT scan done during interval presents to discuss results.  Review of Systems  Constitutional: Positive for appetite change, fatigue and unexpected weight change. Negative for chills and fever.  HENT:   Negative for hearing loss and voice change.   Eyes: Negative for eye problems.  Respiratory: Negative for chest tightness and cough.     Cardiovascular: Negative for chest pain.  Gastrointestinal: Negative for abdominal distention, abdominal pain and blood in stool.  Endocrine: Negative for hot flashes.  Genitourinary: Negative for difficulty urinating and frequency.   Musculoskeletal: Negative for arthralgias and back pain.       Left shoulder pain  Skin: Negative for itching and rash.  Neurological: Negative for extremity weakness.  Hematological: Negative for adenopathy.  Psychiatric/Behavioral: Negative for confusion.    MEDICAL HISTORY:  Past Medical History:  Diagnosis Date  . Chronic lower back pain   . Hyperlipidemia   . Hypertension   . Hypothyroidism   . IDA (iron deficiency anemia)   . Osteoarthritis of both knees   . Osteoporosis   . Overactive bladder   . Vitamin D deficiency     SURGICAL HISTORY: Past Surgical History:  Procedure Laterality Date  . ABDOMINAL HYSTERECTOMY    . BACK SURGERY    . COLONOSCOPY WITH PROPOFOL N/A 07/20/2016   Procedure: COLONOSCOPY WITH PROPOFOL;  Surgeon: Manya Silvas, MD;  Location: [facility] ENDOSCOPY;  Service: Endoscopy;  Laterality: N/A;  . COLONOSCOPY WITH PROPOFOL N/A 02/27/2019   Procedure: COLONOSCOPY WITH PROPOFOL;  Surgeon: Manya Silvas, MD;  Location: [facility] ENDOSCOPY;  Service: Endoscopy;  Laterality: N/A;  . ESOPHAGOGASTRODUODENOSCOPY (EGD) WITH PROPOFOL N/A 07/20/2016   Procedure: ESOPHAGOGASTRODUODENOSCOPY (EGD) WITH PROPOFOL;  Surgeon: Manya Silvas, MD;  Location: [facility] ENDOSCOPY;  Service: Endoscopy;  Laterality: N/A;    SOCIAL HISTORY: Social History   Socioeconomic History  . Marital status: Married    Spouse name: Not on file  . Number of children: Not on file  .  Years of education: Not on file  . Highest education level: Not on file  Occupational History  . Not on file  Tobacco Use  . Smoking status: Never Smoker  . Smokeless tobacco: Never Used  Substance and Sexual Activity  . Alcohol use: No  . Drug use: No  . Sexual  activity: Not Currently  Other Topics Concern  . Not on file  Social History Narrative  . Not on file   Social Determinants of Health   Financial Resource Strain:   . Difficulty of Paying Living Expenses:   Food Insecurity:   . Worried About Charity fundraiser in the Last Year:   . Arboriculturist in the Last Year:   Transportation Needs:   . Film/video editor (Medical):   Marland Kitchen Lack of Transportation (Non-Medical):   Physical Activity:   . Days of Exercise per Week:   . Minutes of Exercise per Session:   Stress:   . Feeling of Stress :   Social Connections:   . Frequency of Communication with Friends and Family:   . Frequency of Social Gatherings with Friends and Family:   . Attends Religious Services:   . Active Member of Clubs or Organizations:   . Attends Archivist Meetings:   Marland Kitchen Marital Status:   Intimate Partner Violence:   . Fear of Current or Ex-Partner:   . Emotionally Abused:   Marland Kitchen Physically Abused:   . Sexually Abused:     FAMILY HISTORY: Family History  Problem Relation Age of Onset  . Stomach cancer Mother   . Lung cancer Father     ALLERGIES:  is allergic to other; estrogens; and nsaids.  MEDICATIONS:  Current Outpatient Medications  Medication Sig Dispense Refill  . acetaminophen (TYLENOL) 500 MG tablet Take by mouth.    Marland Kitchen amLODipine (NORVASC) 5 MG tablet Take 10 mg by mouth daily.     Marland Kitchen aspirin 81 MG tablet Take 81 mg by mouth daily.     . calcium carbonate (OSCAL) 1500 (600 Ca) MG TABS tablet Take 600 mg of elemental calcium by mouth daily with breakfast.     . chlorhexidine (PERIDEX) 0.12 % solution chlorhexidine gluconate 0.12 % mouthwash  SWISH AND SPIT WITH 1 CAPSUL FOR 30 SEC 3 TIMES DAILY    . hydrochlorothiazide (HYDRODIURIL) 12.5 MG tablet hydrochlorothiazide 12.5 mg tablet    . levothyroxine (SYNTHROID, LEVOTHROID) 75 MCG tablet Take 75 mcg by mouth daily before breakfast.    . losartan (COZAAR) 25 MG tablet losartan 25 mg  tablet  TAKE 1 TABLET BY MOUTH EVERY DAY    . oxybutynin (DITROPAN) 5 MG tablet Take 5 mg by mouth 2 (two) times daily.    Marland Kitchen SALONPAS PAIN RELIEVING 4 % PTCH Apply 1 patch topically daily.    . benzonatate (TESSALON) 200 MG capsule Take 1 capsule (200 mg total) by mouth 3 (three) times daily as needed. (Patient not taking: Reported on 12/15/2019) 30 capsule 0  . ferrous sulfate 325 (65 FE) MG EC tablet Take 325 mg by mouth daily.    . Multiple Vitamin (MULTIVITAMIN) tablet Take 1 tablet by mouth daily.    . pravastatin (PRAVACHOL) 80 MG tablet Take 80 mg by mouth at bedtime.     . ranitidine (ZANTAC) 75 MG tablet Take 75 mg by mouth 2 (two) times daily as needed for heartburn.      No current facility-administered medications for this visit.     PHYSICAL EXAMINATION:  ECOG PERFORMANCE STATUS: 2 - Symptomatic, <50% confined to bed Vitals:   01/22/20 1058  BP: 138/84  Pulse: 68  Temp: (!) 97 F (36.1 C)  SpO2: (!) 16%   Filed Weights   01/22/20 1058  Weight: 121 lb 6.4 oz (55.1 kg)    Physical Exam Constitutional:      General: She is not in acute distress. HENT:     Head: Normocephalic and atraumatic.  Eyes:     General: No scleral icterus. Cardiovascular:     Rate and Rhythm: Normal rate and regular rhythm.     Heart sounds: Normal heart sounds.  Pulmonary:     Effort: Pulmonary effort is normal. No respiratory distress.     Breath sounds: No wheezing.  Abdominal:     General: Bowel sounds are normal. There is no distension.     Palpations: Abdomen is soft.  Musculoskeletal:        General: No deformity. Normal range of motion.     Cervical back: Normal range of motion and neck supple.  Skin:    General: Skin is warm and dry.     Findings: No erythema or rash.  Neurological:     General: No focal deficit present.     Mental Status: She is alert. Mental status is at baseline.     Cranial Nerves: No cranial nerve deficit.     Coordination: Coordination normal.   Psychiatric:        Mood and Affect: Mood normal.     LABORATORY DATA:  I have reviewed the data as listed Lab Results  Component Value Date   WBC 7.6 01/11/2020   HGB 12.5 01/11/2020   HCT 37.4 01/11/2020   MCV 92.1 01/11/2020   PLT 289 01/11/2020   Recent Labs    12/04/19 1115 01/11/20 0944  NA 138 138  K 4.8 4.8  CL 105 104  CO2 23 24  GLUCOSE 104* 91  BUN 24* 29*  CREATININE 1.69* 1.58*  CALCIUM 9.1 10.0  GFRNONAA 29* 31*  GFRAA 33* 36*  PROT 7.2 7.0  ALBUMIN 3.9 3.8  AST 18 15  ALT 16 15  ALKPHOS 39 39  BILITOT 0.5 0.6   Iron/TIBC/Ferritin/ %Sat No results found for: IRON, TIBC, FERRITIN, IRONPCTSAT    RADIOGRAPHIC STUDIES: I have personally reviewed the radiological images as listed and agreed with the findings in the report. CT ABDOMEN PELVIS WO CONTRAST  Result Date: 01/20/2020 CLINICAL DATA:  Unintentional weight loss, hyperglycemia EXAM: CT CHEST, ABDOMEN AND PELVIS WITHOUT CONTRAST TECHNIQUE: Multidetector CT imaging of the chest, abdomen and pelvis was performed following the standard protocol without IV contrast. COMPARISON:  None. FINDINGS: CT CHEST FINDINGS Cardiovascular: The heart is normal in size. Small pericardial effusion. No evidence of thoracic aortic aneurysm. Atherosclerotic calcifications of the aortic arch. Three vessel coronary atherosclerosis. Mediastinum/Nodes: No suspicious mediastinal lymphadenopathy. Right thyroid is grossly unremarkable. Left thyroid is not discretely visualized. Lungs/Pleura: 3 mm triangular subpleural nodule in the posterior left upper lobe (series 3/image 43). 2 mm nodule in the posterior left upper lobe (series 3/image 53). 2 mm subpleural nodule in the lateral right upper lobe (series 3/image 69). Bronchiectasis with mild subpleural scarring/atelectasis in the bilateral lower lobes. No focal consolidation. No pleural effusion or pneumothorax. Musculoskeletal: Mild degenerative changes of the thoracic spine with  exaggerated upper/mid thoracic kyphosis. CT ABDOMEN PELVIS FINDINGS Hepatobiliary: Unenhanced liver is unremarkable. Gallbladder is unremarkable. No intrahepatic or extrahepatic ductal dilatation. Pancreas: Within normal limits. Spleen: Within  normal limits. Adrenals/Urinary Tract: Adrenal glands are within normal limits. Dominant 4.1 x 4.8 cm lobulated cyst in the medial left upper kidney (series 2/image 53). Additional 1.7 cm lesion in the lateral left lower kidney which favors a minimally complex/proteinaceous cyst (series 2/image 62), although technically indeterminate. 2.2 x 2.6 x 2.4 cm hyperdense lesion in the posterolateral left upper kidney (series 2/image 59), possibly reflecting a solid renal mass such as renal cell carcinoma, incompletely characterized on unenhanced CT. Right kidney is unremarkable. No renal, ureteral, or bladder calculi. No hydronephrosis. Bladder is within normal limits. Stomach/Bowel: Stomach is within normal limits. Mild wall thickening involving the 2nd portion of the duodenum (series 2/image 66), without discrete mass on unenhanced CT, correlate for peptic ulcer disease/inflammation. No evidence of small bowel obstruction. Appendix is not discretely visualized. Sigmoid diverticulosis, without evidence of diverticulitis. No colonic wall thickening or mass is evident on CT. Vascular/Lymphatic: No evidence of abdominal aortic aneurysm. Atherosclerotic calcifications of the abdominal aorta and branch vessels. No suspicious abdominopelvic lymphadenopathy. Reproductive: Status post hysterectomy. Right ovary is within normal limits.  No left adnexal mass. Other: No abdominopelvic ascites. Musculoskeletal: Lumbar spine is within normal limits. IMPRESSION: 2.6 cm hyperdense lesion in the posterolateral left upper kidney, raising concern for solid renal mass such as renal cell carcinoma, although incompletely characterized on unenhanced CT. MRI abdomen is suggested for further evaluation  (with/without contrast if GFR greater than 30). Mild wall thickening involving the 2nd portion of the duodenum, without discrete mass on CT, favoring inflammatory changes possibly related to peptic ulcer disease. Consider upper endoscopy for further evaluation as clinically warranted. Scattered small bilateral pulmonary nodules measuring up to 3 mm, likely benign. These are not considered suspicious for metastatic disease and given that the patient has known malignancy at this time, Fleischner Society guidelines will be provided. No follow-up needed if patient is low-risk (and has no known or suspected primary neoplasm). Non-contrast chest CT can be considered in 12 months if patient is high-risk. This recommendation follows the consensus statement: Guidelines for Management of Incidental Pulmonary Nodules Detected on CT Images: From the Fleischner Society 2017; Radiology 2017; 284:228-243. Additional ancillary findings as above. Electronically Signed   By: Julian Hy M.D.   On: 01/20/2020 19:27   CT Chest Wo Contrast  Result Date: 01/20/2020 CLINICAL DATA:  Unintentional weight loss, hyperglycemia EXAM: CT CHEST, ABDOMEN AND PELVIS WITHOUT CONTRAST TECHNIQUE: Multidetector CT imaging of the chest, abdomen and pelvis was performed following the standard protocol without IV contrast. COMPARISON:  None. FINDINGS: CT CHEST FINDINGS Cardiovascular: The heart is normal in size. Small pericardial effusion. No evidence of thoracic aortic aneurysm. Atherosclerotic calcifications of the aortic arch. Three vessel coronary atherosclerosis. Mediastinum/Nodes: No suspicious mediastinal lymphadenopathy. Right thyroid is grossly unremarkable. Left thyroid is not discretely visualized. Lungs/Pleura: 3 mm triangular subpleural nodule in the posterior left upper lobe (series 3/image 43). 2 mm nodule in the posterior left upper lobe (series 3/image 53). 2 mm subpleural nodule in the lateral right upper lobe (series 3/image  69). Bronchiectasis with mild subpleural scarring/atelectasis in the bilateral lower lobes. No focal consolidation. No pleural effusion or pneumothorax. Musculoskeletal: Mild degenerative changes of the thoracic spine with exaggerated upper/mid thoracic kyphosis. CT ABDOMEN PELVIS FINDINGS Hepatobiliary: Unenhanced liver is unremarkable. Gallbladder is unremarkable. No intrahepatic or extrahepatic ductal dilatation. Pancreas: Within normal limits. Spleen: Within normal limits. Adrenals/Urinary Tract: Adrenal glands are within normal limits. Dominant 4.1 x 4.8 cm lobulated cyst in the medial left upper kidney (series 2/image  53). Additional 1.7 cm lesion in the lateral left lower kidney which favors a minimally complex/proteinaceous cyst (series 2/image 62), although technically indeterminate. 2.2 x 2.6 x 2.4 cm hyperdense lesion in the posterolateral left upper kidney (series 2/image 59), possibly reflecting a solid renal mass such as renal cell carcinoma, incompletely characterized on unenhanced CT. Right kidney is unremarkable. No renal, ureteral, or bladder calculi. No hydronephrosis. Bladder is within normal limits. Stomach/Bowel: Stomach is within normal limits. Mild wall thickening involving the 2nd portion of the duodenum (series 2/image 66), without discrete mass on unenhanced CT, correlate for peptic ulcer disease/inflammation. No evidence of small bowel obstruction. Appendix is not discretely visualized. Sigmoid diverticulosis, without evidence of diverticulitis. No colonic wall thickening or mass is evident on CT. Vascular/Lymphatic: No evidence of abdominal aortic aneurysm. Atherosclerotic calcifications of the abdominal aorta and branch vessels. No suspicious abdominopelvic lymphadenopathy. Reproductive: Status post hysterectomy. Right ovary is within normal limits.  No left adnexal mass. Other: No abdominopelvic ascites. Musculoskeletal: Lumbar spine is within normal limits. IMPRESSION: 2.6 cm  hyperdense lesion in the posterolateral left upper kidney, raising concern for solid renal mass such as renal cell carcinoma, although incompletely characterized on unenhanced CT. MRI abdomen is suggested for further evaluation (with/without contrast if GFR greater than 30). Mild wall thickening involving the 2nd portion of the duodenum, without discrete mass on CT, favoring inflammatory changes possibly related to peptic ulcer disease. Consider upper endoscopy for further evaluation as clinically warranted. Scattered small bilateral pulmonary nodules measuring up to 3 mm, likely benign. These are not considered suspicious for metastatic disease and given that the patient has known malignancy at this time, Fleischner Society guidelines will be provided. No follow-up needed if patient is low-risk (and has no known or suspected primary neoplasm). Non-contrast chest CT can be considered in 12 months if patient is high-risk. This recommendation follows the consensus statement: Guidelines for Management of Incidental Pulmonary Nodules Detected on CT Images: From the Fleischner Society 2017; Radiology 2017; 284:228-243. Additional ancillary findings as above. Electronically Signed   By: Julian Hy M.D.   On: 01/20/2020 19:27      ASSESSMENT & PLAN:  1. Kidney mass   2. Weight loss   3. Stage 4 chronic kidney disease ([place])   4. Hypercalcemia    #Left kidney mass, CT chest abdomen pelvis was independently reviewed by me and discussed with patient and husband. There is a 2.6 cm hyper dense lesion in the left upper kidney, questionable renal mass has renal cell carcinoma. Not completely characterized on a head CT. I recommend patient to proceed with MRI abdomen.    #Unintentional weight loss, I am not sure if kidney mass is contributing to her weight loss or not. CT showed some mild wall thickening of the duodenum. Patient has had endoscopy done in 2017.  I will refer patient to reestablish care with  [facility] for discussion of repeat endoscopy.  #Hypercalcemia Normal PTH, 25-hydroxy vitamin D level, no M protein on multiple myeloma panel. Negative PTH RP, normal 1,25 hydroxy vitamin D level  Calcium level is at 10.  Continue to hold off oral calcium supplementation.  #CKD, avoid nephrotoxins.  All questions were answered. The patient knows to call the clinic with any problems questions or concerns.   Return of visit: 2 months with repeat CBC and CMP.     Earlie Server, MD, PhD Hematology Oncology [facility] Pager- [phone] 01/22/2020

## 2020-01-25 ENCOUNTER — Other Ambulatory Visit: Admission: RE | Admit: 2020-01-25 | Payer: Medicare HMO | Source: Ambulatory Visit

## 2020-01-26 ENCOUNTER — Encounter: Payer: Self-pay | Admitting: Internal Medicine

## 2020-01-26 ENCOUNTER — Other Ambulatory Visit
Admission: RE | Admit: 2020-01-26 | Discharge: 2020-01-26 | Disposition: A | Discharge status: Home / Self Care | Payer: Medicare HMO | Source: Ambulatory Visit | Attending: Internal Medicine | Admitting: Internal Medicine

## 2020-01-26 ENCOUNTER — Other Ambulatory Visit: Payer: Self-pay

## 2020-01-26 DIAGNOSIS — Z20822 Contact with and (suspected) exposure to covid-19: Secondary | ICD-10-CM | POA: Diagnosis not present

## 2020-01-26 DIAGNOSIS — Z01812 Encounter for preprocedural laboratory examination: Secondary | ICD-10-CM | POA: Diagnosis present

## 2020-01-26 LAB — SARS CORONAVIRUS 2 (TAT 6-24 HRS): SARS Coronavirus 2: NEGATIVE

## 2020-01-29 ENCOUNTER — Encounter
Admission: RE | Disposition: A | Discharge status: Home / Self Care | Payer: Self-pay | Source: Home / Self Care | Attending: Internal Medicine

## 2020-01-29 ENCOUNTER — Encounter: Payer: Self-pay | Admitting: Internal Medicine

## 2020-01-29 ENCOUNTER — Other Ambulatory Visit: Payer: Self-pay

## 2020-01-29 ENCOUNTER — Ambulatory Visit: Payer: Medicare HMO | Admitting: Anesthesiology

## 2020-01-29 ENCOUNTER — Ambulatory Visit
Admission: RE | Admit: 2020-01-29 | Discharge: 2020-01-29 | Disposition: A | Discharge status: Home / Self Care | Payer: Medicare HMO | Attending: Internal Medicine | Admitting: Internal Medicine

## 2020-01-29 DIAGNOSIS — Z79899 Other long term (current) drug therapy: Secondary | ICD-10-CM | POA: Insufficient documentation

## 2020-01-29 DIAGNOSIS — R6881 Early satiety: Secondary | ICD-10-CM | POA: Insufficient documentation

## 2020-01-29 DIAGNOSIS — K2970 Gastritis, unspecified, without bleeding: Secondary | ICD-10-CM | POA: Diagnosis not present

## 2020-01-29 DIAGNOSIS — I129 Hypertensive chronic kidney disease with stage 1 through stage 4 chronic kidney disease, or unspecified chronic kidney disease: Secondary | ICD-10-CM | POA: Diagnosis not present

## 2020-01-29 DIAGNOSIS — E785 Hyperlipidemia, unspecified: Secondary | ICD-10-CM | POA: Diagnosis not present

## 2020-01-29 DIAGNOSIS — M170 Bilateral primary osteoarthritis of knee: Secondary | ICD-10-CM | POA: Insufficient documentation

## 2020-01-29 DIAGNOSIS — Z79890 Hormone replacement therapy (postmenopausal): Secondary | ICD-10-CM | POA: Insufficient documentation

## 2020-01-29 DIAGNOSIS — E039 Hypothyroidism, unspecified: Secondary | ICD-10-CM | POA: Diagnosis not present

## 2020-01-29 DIAGNOSIS — E559 Vitamin D deficiency, unspecified: Secondary | ICD-10-CM | POA: Diagnosis not present

## 2020-01-29 DIAGNOSIS — R948 Abnormal results of function studies of other organs and systems: Secondary | ICD-10-CM | POA: Insufficient documentation

## 2020-01-29 DIAGNOSIS — N183 Chronic kidney disease, stage 3 unspecified: Secondary | ICD-10-CM | POA: Insufficient documentation

## 2020-01-29 DIAGNOSIS — R634 Abnormal weight loss: Secondary | ICD-10-CM | POA: Diagnosis not present

## 2020-01-29 DIAGNOSIS — Z7982 Long term (current) use of aspirin: Secondary | ICD-10-CM | POA: Diagnosis not present

## 2020-01-29 DIAGNOSIS — R630 Anorexia: Secondary | ICD-10-CM | POA: Insufficient documentation

## 2020-01-29 HISTORY — DX: Chronic kidney disease, unspecified: N18.9

## 2020-01-29 HISTORY — DX: Benign neoplasm of meninges, unspecified: D32.9

## 2020-01-29 HISTORY — PX: ESOPHAGOGASTRODUODENOSCOPY (EGD) WITH PROPOFOL: SHX5813

## 2020-01-29 SURGERY — ESOPHAGOGASTRODUODENOSCOPY (EGD) WITH PROPOFOL
Anesthesia: General

## 2020-01-29 MED ORDER — PROPOFOL 500 MG/50ML IV EMUL
INTRAVENOUS | Status: DC | PRN
  Administered 2020-01-29: 150 ug/kg/min via INTRAVENOUS

## 2020-01-29 MED ORDER — SODIUM CHLORIDE 0.9 % IV SOLN
INTRAVENOUS | Status: DC
  Administered 2020-01-29: 1000 mL via INTRAVENOUS

## 2020-01-29 MED ORDER — SODIUM CHLORIDE 0.9 % IV SOLN: INTRAVENOUS | Status: DC | PRN

## 2020-01-29 MED ORDER — PROPOFOL 10 MG/ML IV BOLUS
INTRAVENOUS | Status: DC | PRN
  Administered 2020-01-29: 30 mg via INTRAVENOUS
  Administered 2020-01-29: 20 mg via INTRAVENOUS

## 2020-01-29 NOTE — Transfer of Care (Signed)
Immediate Anesthesia Transfer of Care Note  Patient: Laura Bishop  Procedure(s) Performed: ESOPHAGOGASTRODUODENOSCOPY (EGD) WITH PROPOFOL (N/A )  Patient Location: PACU, Endoscopy  Anesthesia Type:General  Level of Consciousness: drowsy  Airway & Oxygen Therapy: Patient Spontanous Breathing  Post-op Assessment: Report given to RN and Post -op Vital signs reviewed and stable  Post vital signs: Reviewed and stable  Last Vitals:  Vitals Value Taken Time  BP 127/77 01/29/20 1026  Temp    Pulse 70 01/29/20 1027  Resp 20 01/29/20 1027  SpO2 99 % 01/29/20 1027  Vitals shown include unvalidated device data.  Last Pain:  Vitals:   01/29/20 0945  TempSrc: Temporal  PainSc: 0-No pain         Complications: No apparent anesthesia complications

## 2020-01-29 NOTE — H&P (Signed)
Outpatient short stay form Pre-procedure 01/29/2020 10:04 AM Laura Habib K. Laura Bishop, M.D.  Primary Physician: Ezequiel Kayser, MD  Reason for visit: Weight loss, abnormal CT of the upper GI tract.  History of present illness: 79 year old female with a history of reflux esophagitis presents with involuntary weight loss of 30 pounds over the last 6 months.  Patient had an abdominal CT scan revealing inflammatory changes in the second portion of the duodenum, possibly related to peptic ulcer disease.  Also noted was a left-sided potential renal cell carcinoma which is undergoing further work-up.  There is no dysphagia, nausea vomiting or anorexia.    Current Facility-Administered Medications:  .  0.9 %  sodium chloride infusion, , Intravenous, Continuous, Vaughn, Benay Pike, MD, Last Rate: 20 mL/hr at 01/29/20 1001, 1,000 mL at 01/29/20 1001  Medications Prior to Admission  Medication Sig Dispense Refill Last Dose  . acetaminophen (TYLENOL) 500 MG tablet Take by mouth.   Past Week at Unknown time  . amLODipine (NORVASC) 5 MG tablet Take 10 mg by mouth daily.    01/29/2020 at 600  . aspirin 81 MG tablet Take 81 mg by mouth daily.    01/28/2020 at Unknown time  . calcium carbonate (OSCAL) 1500 (600 Ca) MG TABS tablet Take 600 mg of elemental calcium by mouth daily with breakfast.    01/28/2020 at Unknown time  . chlorhexidine (PERIDEX) 0.12 % solution chlorhexidine gluconate 0.12 % mouthwash  SWISH AND SPIT WITH 1 CAPSUL FOR 30 SEC 3 TIMES DAILY   01/29/2020 at Unknown time  . diphenhydramine-acetaminophen (TYLENOL PM) 25-500 MG TABS tablet Take 1 tablet by mouth at bedtime as needed.   Past Month at Unknown time  . ferrous sulfate 325 (65 FE) MG EC tablet Take 325 mg by mouth daily.   01/28/2020 at Unknown time  . Fluocinolone Acetonide (DERMOTIC OT) Place 3 drops in ear(s) as needed.   01/28/2020 at Unknown time  . hydrochlorothiazide (HYDRODIURIL) 12.5 MG tablet hydrochlorothiazide 12.5 mg tablet   01/28/2020  at Unknown time  . levothyroxine (SYNTHROID, LEVOTHROID) 75 MCG tablet Take 75 mcg by mouth daily before breakfast.   01/28/2020 at Unknown time  . losartan (COZAAR) 25 MG tablet losartan 25 mg tablet  TAKE 1 TABLET BY MOUTH EVERY DAY   01/28/2020 at Unknown time  . Multiple Vitamin (MULTIVITAMIN) tablet Take 1 tablet by mouth daily.   01/28/2020 at Unknown time  . oxybutynin (DITROPAN) 5 MG tablet Take 5 mg by mouth 2 (two) times daily.   01/28/2020 at Unknown time  . pravastatin (PRAVACHOL) 80 MG tablet Take 80 mg by mouth at bedtime.    01/28/2020 at Unknown time  . VITAMIN D PO Take 125 mcg by mouth daily.   01/28/2020 at Unknown time  . benzonatate (TESSALON) 200 MG capsule Take 1 capsule (200 mg total) by mouth 3 (three) times daily as needed. (Patient not taking: Reported on 12/15/2019) 30 capsule 0   . metaxalone (SKELAXIN) 800 MG tablet Take 800 mg by mouth as needed for muscle spasms.   Not Taking at Unknown time  . ranitidine (ZANTAC) 75 MG tablet Take 75 mg by mouth 2 (two) times daily as needed for heartburn.    Not Taking at Unknown time  . SALONPAS PAIN RELIEVING 4 % PTCH Apply 1 patch topically daily.   Not Taking at Unknown time     Allergies  Allergen Reactions  . Other Other (See Comments)    Contraindicated by renal insufficiency Contraindicated by  renal insufficiency   . Estrogens   . Nsaids      Past Medical History:  Diagnosis Date  . Chronic kidney disease    chronic kidney disease stage III  . Chronic lower back pain   . Hyperlipidemia   . Hypertension   . Hypothyroidism   . IDA (iron deficiency anemia)   . Meningioma ([place])   . Osteoarthritis of both knees    osteoarthritis of knee  . Osteoporosis   . Overactive bladder   . Vitamin D deficiency     Review of systems:  Otherwise negative.    Physical Exam  Gen: Alert, oriented. Appears stated age.  HEENT: [place]/AT. PERRLA. Lungs: CTA, no wheezes. CV: RR nl S1, S2. Abd: soft, benign, no masses.  BS+ Ext: No edema. Pulses 2+    Planned procedures: Proceed with EGD. The patient understands the nature of the planned procedure, indications, risks, alternatives and potential complications including but not limited to bleeding, infection, perforation, damage to internal organs and possible oversedation/side effects from anesthesia. The patient agrees and gives consent to proceed.  Please refer to procedure notes for findings, recommendations and patient disposition/instructions.     Murphy Bundick K. Laura Bishop, M.D. Gastroenterology 01/29/2020  10:04 AM

## 2020-01-29 NOTE — Op Note (Signed)
[facility] Patient Name: Laura Bishop Procedure Date: 01/29/2020 10:09 AM MRN: [id number] Account #: [id number] Date of Birth: [birth year] Admit Type: Outpatient Age: 79 Room: [facility] ENDO ROOM 3 Gender: Female Note Status: Finalized Procedure:             Upper GI endoscopy Indications:           Abnormal CT of the GI tract, Anorexia, Early satiety,                         Weight loss Providers:             Benay Pike. Alice Reichert MD, MD Referring MD:          Christena Flake. Raechel Ache, MD (Referring MD) Medicines:             Propofol per Anesthesia Complications:         No immediate complications. Procedure:             Pre-Anesthesia Assessment:                        - After reviewing the risks and benefits, the patient                         was deemed in satisfactory condition to undergo the                         procedure.                        - ASA Grade Assessment: III - A patient with severe                         systemic disease.                        - The risks and benefits of the procedure and the                         sedation options and risks were discussed with the                         patient. All questions were answered and informed                         consent was obtained.                        - Patient identification and proposed procedure were                         verified prior to the procedure by the nurse. The                         procedure was verified in the procedure room.                        After obtaining informed consent, the endoscope was                         passed under direct  vision. Throughout the procedure,                         the patient's blood pressure, pulse, and oxygen                         saturations were monitored continuously. The Endoscope                         was introduced through the mouth, and advanced to the                         third part of duodenum. The upper GI  endoscopy was                         accomplished without difficulty. The patient tolerated                         the procedure well. Findings:      The esophagus was normal.      Patchy mild inflammation characterized by erosions and erythema was       found in the gastric antrum. Biopsies were taken with a cold forceps for       Helicobacter pylori testing.      The examined duodenum was normal. No inflammatory changes to correlate       with CT changes. No ulcerations or other mucosal changes.      The cardia and gastric fundus were normal on retroflexion. Impression:            - Normal esophagus.                        - Gastritis. Biopsied.                        - Normal examined duodenum. Recommendation:        - Patient has a contact number available for                         emergencies. The signs and symptoms of potential                         delayed complications were discussed with the patient.                         Return to normal activities tomorrow. Written                         discharge instructions were provided to the patient.                        - Resume previous diet.                        - Continue present medications.                        - Await pathology results.                        - Return to nurse  practitioner in 4 weeks.                        - Please follow up with Effie Berkshire, PA-C in 6                         weeks                        - The findings and recommendations were discussed with                         the patient. Procedure Code(s):     --- Professional ---                        [id number], Esophagogastroduodenoscopy, flexible,                         transoral; with biopsy, single or multiple Diagnosis Code(s):     --- Professional ---                        R93.3, Abnormal findings on diagnostic imaging of                         other parts of digestive tract                        R63.4, Abnormal weight loss                         R68.81, Early satiety                        R63.0, Anorexia                        K29.70, Gastritis, unspecified, without bleeding CPT copyright 2019 [employer]. All rights reserved. The codes documented in this report are preliminary and upon coder review may  be revised to meet current compliance requirements. Efrain Sella MD, MD 01/29/2020 10:26:36 AM This report has been signed electronically. Number of Addenda: 0 Note Initiated On: 01/29/2020 10:09 AM Estimated Blood Loss:  Estimated blood loss: none.      [facility]

## 2020-01-29 NOTE — Interval H&P Note (Signed)
History and Physical Interval Note:  01/29/2020 10:06 AM  Laura Bishop  has presented today for surgery, with the diagnosis of ABNORMAL CT.  The various methods of treatment have been discussed with the patient and family. After consideration of risks, benefits and other options for treatment, the patient has consented to  Procedure(s): ESOPHAGOGASTRODUODENOSCOPY (EGD) WITH PROPOFOL (N/A) as a surgical intervention.  The patient's history has been reviewed, patient examined, no change in status, stable for surgery.  I have reviewed the patient's chart and labs.  Questions were answered to the patient's satisfaction.     [place], [place]

## 2020-01-29 NOTE — Anesthesia Preprocedure Evaluation (Addendum)
Anesthesia Evaluation  Patient identified by MRN, date of birth, ID band Patient awake    Reviewed: Allergy & Precautions, NPO status , Patient's Chart, lab work & pertinent test results  Airway Mallampati: III       Dental  (+) Upper Dentures, Lower Dentures   Pulmonary neg pulmonary ROS,           Cardiovascular hypertension,      Neuro/Psych negative psych ROS   GI/Hepatic Neg liver ROS,   Endo/Other  Hypothyroidism   Renal/GU Renal disease Bladder dysfunction      Musculoskeletal  (+) Arthritis , Osteoarthritis,    Abdominal   Peds negative pediatric ROS (+)  Hematology  (+) anemia ,   Anesthesia Other Findings Past Medical History: No date: Chronic kidney disease     Comment:  chronic kidney disease stage III No date: Chronic lower back pain No date: Hyperlipidemia No date: Hypertension No date: Hypothyroidism No date: IDA (iron deficiency anemia) No date: Meningioma (HCC) No date: Osteoarthritis of both knees     Comment:  osteoarthritis of knee No date: Osteoporosis No date: Overactive bladder No date: Vitamin D deficiency  Reproductive/Obstetrics                            Anesthesia Physical Anesthesia Plan  ASA: III  Anesthesia Plan: General   Post-op Pain Management:    Induction: Intravenous  PONV Risk Score and Plan: Propofol infusion  Airway Management Planned: Nasal Cannula  Additional Equipment:   Intra-op Plan:   Post-operative Plan:   Informed Consent: I have reviewed the patients History and Physical, chart, labs and discussed the procedure including the risks, benefits and alternatives for the proposed anesthesia with the patient or authorized representative who has indicated his/her understanding and acceptance.     Dental advisory given  Plan Discussed with: CRNA and Surgeon  Anesthesia Plan Comments:         Anesthesia Quick  Evaluation

## 2020-01-29 NOTE — Anesthesia Postprocedure Evaluation (Signed)
Anesthesia Post Note  Patient: Laura Bishop  Procedure(s) Performed: ESOPHAGOGASTRODUODENOSCOPY (EGD) WITH PROPOFOL (N/A )  Patient location during evaluation: Endoscopy Anesthesia Type: General Level of consciousness: awake and alert and oriented Pain management: pain level controlled Vital Signs Assessment: post-procedure vital signs reviewed and stable Respiratory status: spontaneous breathing Cardiovascular status: blood pressure returned to baseline Anesthetic complications: no     Last Vitals:  Vitals:   01/29/20 1026 01/29/20 1056  BP:  (!) 148/92  Pulse:    Resp:    Temp: (!) 36.1 C   SpO2:      Last Pain:  Vitals:   01/29/20 1056  TempSrc:   PainSc: 0-No pain                 Elexa Kivi

## 2020-01-30 LAB — SURGICAL PATHOLOGY

## 2020-01-31 ENCOUNTER — Encounter: Payer: Self-pay | Admitting: *Deleted

## 2020-02-01 ENCOUNTER — Other Ambulatory Visit: Payer: Self-pay

## 2020-02-01 ENCOUNTER — Ambulatory Visit
Admission: RE | Admit: 2020-02-01 | Discharge: 2020-02-01 | Disposition: A | Discharge status: Home / Self Care | Payer: Medicare HMO | Source: Ambulatory Visit | Attending: Oncology | Admitting: Oncology

## 2020-02-01 DIAGNOSIS — N2889 Other specified disorders of kidney and ureter: Secondary | ICD-10-CM | POA: Insufficient documentation

## 2020-02-01 DIAGNOSIS — R634 Abnormal weight loss: Secondary | ICD-10-CM | POA: Insufficient documentation

## 2020-02-01 MED ORDER — GADOBUTROL 1 MMOL/ML IV SOLN
5.0000 mL | Freq: Once | INTRAVENOUS | Status: AC | PRN
  Administered 2020-02-01: 09:00:00 5 mL via INTRAVENOUS

## 2020-02-06 ENCOUNTER — Telehealth: Payer: Self-pay

## 2020-02-06 NOTE — Telephone Encounter (Signed)
-----   Message from Earlie Server, MD sent at 02/05/2020 10:18 PM EDT ----- MRI showed that her left kidney lesion is likely benign etiology. No need for additional work up at this point.

## 2020-02-06 NOTE — Telephone Encounter (Signed)
Patient notified of results.

## 2020-03-27 ENCOUNTER — Inpatient Hospital Stay: Payer: Medicare HMO | Attending: Oncology

## 2020-03-27 ENCOUNTER — Inpatient Hospital Stay (HOSPITAL_BASED_OUTPATIENT_CLINIC_OR_DEPARTMENT_OTHER): Payer: Medicare HMO | Admitting: Oncology

## 2020-03-27 ENCOUNTER — Encounter: Payer: Self-pay | Admitting: Oncology

## 2020-03-27 ENCOUNTER — Other Ambulatory Visit: Payer: Self-pay

## 2020-03-27 VITALS — BP 146/84 | HR 58 | Temp 95.7°F | Resp 16 | Wt 121.8 lb

## 2020-03-27 DIAGNOSIS — N189 Chronic kidney disease, unspecified: Secondary | ICD-10-CM | POA: Insufficient documentation

## 2020-03-27 DIAGNOSIS — M170 Bilateral primary osteoarthritis of knee: Secondary | ICD-10-CM | POA: Diagnosis not present

## 2020-03-27 DIAGNOSIS — R634 Abnormal weight loss: Secondary | ICD-10-CM

## 2020-03-27 DIAGNOSIS — N184 Chronic kidney disease, stage 4 (severe): Secondary | ICD-10-CM

## 2020-03-27 DIAGNOSIS — M810 Age-related osteoporosis without current pathological fracture: Secondary | ICD-10-CM | POA: Diagnosis not present

## 2020-03-27 DIAGNOSIS — E559 Vitamin D deficiency, unspecified: Secondary | ICD-10-CM | POA: Diagnosis not present

## 2020-03-27 DIAGNOSIS — E785 Hyperlipidemia, unspecified: Secondary | ICD-10-CM | POA: Diagnosis not present

## 2020-03-27 DIAGNOSIS — I129 Hypertensive chronic kidney disease with stage 1 through stage 4 chronic kidney disease, or unspecified chronic kidney disease: Secondary | ICD-10-CM | POA: Diagnosis not present

## 2020-03-27 DIAGNOSIS — E039 Hypothyroidism, unspecified: Secondary | ICD-10-CM | POA: Diagnosis not present

## 2020-03-27 DIAGNOSIS — N2889 Other specified disorders of kidney and ureter: Secondary | ICD-10-CM

## 2020-03-27 LAB — COMPREHENSIVE METABOLIC PANEL
ALT: 16 U/L (ref 0–44)
AST: 16 U/L (ref 15–41)
Albumin: 3.9 g/dL (ref 3.5–5.0)
Alkaline Phosphatase: 36 U/L — ABNORMAL LOW (ref 38–126)
Anion gap: 8 (ref 5–15)
BUN: 29 mg/dL — ABNORMAL HIGH (ref 8–23)
CO2: 27 mmol/L (ref 22–32)
Calcium: 9.9 mg/dL (ref 8.9–10.3)
Chloride: 106 mmol/L (ref 98–111)
Creatinine, Ser: 1.47 mg/dL — ABNORMAL HIGH (ref 0.44–1.00)
GFR calc Af Amer: 39 mL/min — ABNORMAL LOW (ref >60)
GFR calc non Af Amer: 34 mL/min — ABNORMAL LOW (ref >60)
Glucose, Bld: 95 mg/dL (ref 70–99)
Potassium: 5.3 mmol/L — ABNORMAL HIGH (ref 3.5–5.1)
Sodium: 141 mmol/L (ref 135–145)
Total Bilirubin: 0.6 mg/dL (ref 0.3–1.2)
Total Protein: 6.6 g/dL (ref 6.5–8.1)

## 2020-03-27 LAB — CBC WITH DIFFERENTIAL/PLATELET
Abs Immature Granulocytes: 0.04 10*3/uL (ref 0.00–0.07)
Basophils Absolute: 0 10*3/uL (ref 0.0–0.1)
Basophils Relative: 0 %
Eosinophils Absolute: 0.1 10*3/uL (ref 0.0–0.5)
Eosinophils Relative: 1 %
HCT: 36.1 % (ref 36.0–46.0)
Hemoglobin: 11.9 g/dL — ABNORMAL LOW (ref 12.0–15.0)
Immature Granulocytes: 1 %
Lymphocytes Relative: 24 %
Lymphs Abs: 1.8 10*3/uL (ref 0.7–4.0)
MCH: 30.7 pg (ref 26.0–34.0)
MCHC: 33 g/dL (ref 30.0–36.0)
MCV: 93 fL (ref 80.0–100.0)
Monocytes Absolute: 0.7 10*3/uL (ref 0.1–1.0)
Monocytes Relative: 9 %
Neutro Abs: 4.9 10*3/uL (ref 1.7–7.7)
Neutrophils Relative %: 65 %
Platelets: 264 10*3/uL (ref 150–400)
RBC: 3.88 MIL/uL (ref 3.87–5.11)
RDW: 12.4 % (ref 11.5–15.5)
WBC: 7.5 10*3/uL (ref 4.0–10.5)
nRBC: 0 % (ref 0.0–0.2)

## 2020-03-27 NOTE — Progress Notes (Signed)
Patient denies new problems/concerns today.   °

## 2020-03-27 NOTE — Progress Notes (Signed)
Hematology/Oncology Consult note [facility] Telephone:([phone] [phone]   Patient Care Team: Ezequiel Kayser, MD as PCP - General (Internal Medicine)  REFERRING PROVIDER: Ezequiel Kayser, MD  CHIEF COMPLAINTS/REASON FOR VISIT:  Follow-up for  hypercalcemia, kidney mass  HISTORY OF PRESENTING ILLNESS:   Laura Bishop is a  79 y.o.  female with PMH listed below was seen in consultation at the request of  Ezequiel Kayser, MD  for evaluation of hypercalcemia Patient is a poor historian.  Most history was obtained from her husband. Patient reports very fatigued, lack of appetite and some weight loss recently. She went to [facility] emergency room 11/09/2019 due to her back and shoulder pain. There is some level was 12.5, and was thought to be secondary to dehydration.  Patient followed up with primary care provider Dr.Thies. 11/17/2019, repeat calcium level 13.2.,  Patient work-up showed vitamin D 25 hydroxy normal at 41.  PTH level normal at 36.  Ionized calcium level was at 6.4. Patient also has a history of creatinine at baseline around 1.3-1.7.  Patient was advised to stop calcium supplementation as well as vitamin D supplementation.  Referred to establish care with hematology for further evaluation.  INTERVAL HISTORY [name] is a 79 y.o. female who has above history reviewed by me today presents for follow up visit for management of weight loss, hypercalcemia.  During the interval patient has had an evaluation by gastroenterology and had EGD done.  EGD showed gastritis. Patient appears to have better appetite now.  Weight has improved. Today she reports no new complaints.  She was accompanied by her husband.  Review of Systems  Constitutional: Negative for appetite change, chills, fatigue, fever and unexpected weight change.  HENT:   Negative for hearing loss and voice change.   Eyes: Negative for eye problems.  Respiratory: Negative for chest  tightness and cough.   Cardiovascular: Negative for chest pain.  Gastrointestinal: Negative for abdominal distention, abdominal pain and blood in stool.  Endocrine: Negative for hot flashes.  Genitourinary: Negative for difficulty urinating and frequency.   Musculoskeletal: Negative for arthralgias and back pain.       Left shoulder pain  Skin: Negative for itching and rash.  Neurological: Negative for extremity weakness.  Hematological: Negative for adenopathy.  Psychiatric/Behavioral: Negative for confusion.    MEDICAL HISTORY:  Past Medical History:  Diagnosis Date  . Chronic kidney disease    chronic kidney disease stage III  . Chronic lower back pain   . Hyperlipidemia   . Hypertension   . Hypothyroidism   . IDA (iron deficiency anemia)   . Meningioma ([place])   . Osteoarthritis of both knees    osteoarthritis of knee  . Osteoporosis   . Overactive bladder   . Vitamin D deficiency     SURGICAL HISTORY: Past Surgical History:  Procedure Laterality Date  . ABDOMINAL HYSTERECTOMY    . APPENDECTOMY    . BACK SURGERY    . COLONOSCOPY WITH PROPOFOL N/A 07/20/2016   Procedure: COLONOSCOPY WITH PROPOFOL;  Surgeon: Manya Silvas, MD;  Location: [facility] ENDOSCOPY;  Service: Endoscopy;  Laterality: N/A;  . COLONOSCOPY WITH PROPOFOL N/A 02/27/2019   Procedure: COLONOSCOPY WITH PROPOFOL;  Surgeon: Manya Silvas, MD;  Location: [facility] ENDOSCOPY;  Service: Endoscopy;  Laterality: N/A;  . ESOPHAGOGASTRODUODENOSCOPY (EGD) WITH PROPOFOL N/A 07/20/2016   Procedure: ESOPHAGOGASTRODUODENOSCOPY (EGD) WITH PROPOFOL;  Surgeon: Manya Silvas, MD;  Location: [facility] ENDOSCOPY;  Service: Endoscopy;  Laterality: N/A;  .  ESOPHAGOGASTRODUODENOSCOPY (EGD) WITH PROPOFOL N/A 01/29/2020   Procedure: ESOPHAGOGASTRODUODENOSCOPY (EGD) WITH PROPOFOL;  Surgeon: Toledo, Benay Pike, MD;  Location: [facility] ENDOSCOPY;  Service: Gastroenterology;  Laterality: N/A;  . EYE SURGERY     extraction cataract  . lobectomy  total thyroid      SOCIAL HISTORY: Social History   Socioeconomic History  . Marital status: Married    Spouse name: Not on file  . Number of children: Not on file  . Years of education: Not on file  . Highest education level: Not on file  Occupational History  . Not on file  Tobacco Use  . Smoking status: Never Smoker  . Smokeless tobacco: Never Used  Vaping Use  . Vaping Use: Never used  Substance and Sexual Activity  . Alcohol use: No  . Drug use: No  . Sexual activity: Not Currently  Other Topics Concern  . Not on file  Social History Narrative  . Not on file   Social Determinants of Health   Financial Resource Strain:   . Difficulty of Paying Living Expenses:   Food Insecurity:   . Worried About Charity fundraiser in the Last Year:   . Arboriculturist in the Last Year:   Transportation Needs:   . Film/video editor (Medical):   Marland Kitchen Lack of Transportation (Non-Medical):   Physical Activity:   . Days of Exercise per Week:   . Minutes of Exercise per Session:   Stress:   . Feeling of Stress :   Social Connections:   . Frequency of Communication with Friends and Family:   . Frequency of Social Gatherings with Friends and Family:   . Attends Religious Services:   . Active Member of Clubs or Organizations:   . Attends Archivist Meetings:   Marland Kitchen Marital Status:   Intimate Partner Violence:   . Fear of Current or Ex-Partner:   . Emotionally Abused:   Marland Kitchen Physically Abused:   . Sexually Abused:     FAMILY HISTORY: Family History  Problem Relation Age of Onset  . Stomach cancer Mother   . Lung cancer Father     ALLERGIES:  is allergic to other, estrogens, and nsaids.  MEDICATIONS:  Current Outpatient Medications  Medication Sig Dispense Refill  . acetaminophen (TYLENOL) 500 MG tablet Take by mouth.    Marland Kitchen amLODipine (NORVASC) 5 MG tablet Take 10 mg by mouth daily.     Marland Kitchen aspirin 81 MG tablet Take 81 mg by mouth daily.     . chlorhexidine  (PERIDEX) 0.12 % solution chlorhexidine gluconate 0.12 % mouthwash  SWISH AND SPIT WITH 1 CAPSUL FOR 30 SEC 3 TIMES DAILY    . diphenhydramine-acetaminophen (TYLENOL PM) 25-500 MG TABS tablet Take 1 tablet by mouth at bedtime as needed.    . Fluocinolone Acetonide (DERMOTIC OT) Place 3 drops in ear(s) as needed.    . hydrochlorothiazide (HYDRODIURIL) 12.5 MG tablet hydrochlorothiazide 12.5 mg tablet    . levothyroxine (SYNTHROID, LEVOTHROID) 75 MCG tablet Take 75 mcg by mouth daily before breakfast.    . losartan (COZAAR) 25 MG tablet losartan 25 mg tablet  TAKE 1 TABLET BY MOUTH EVERY DAY    . Multiple Vitamin (MULTIVITAMIN) tablet Take 1 tablet by mouth daily.    Marland Kitchen oxybutynin (DITROPAN) 5 MG tablet Take 5 mg by mouth 2 (two) times daily.    Marland Kitchen SALONPAS PAIN RELIEVING 4 % PTCH Apply 1 patch topically as needed.     Marland Kitchen  VITAMIN D PO Take 125 mcg by mouth daily.    . benzonatate (TESSALON) 200 MG capsule Take 1 capsule (200 mg total) by mouth 3 (three) times daily as needed. (Patient not taking: Reported on 12/15/2019) 30 capsule 0  . calcium carbonate (OSCAL) 1500 (600 Ca) MG TABS tablet Take 600 mg of elemental calcium by mouth daily with breakfast.  (Patient not taking: Reported on 03/27/2020)    . ferrous sulfate 325 (65 FE) MG EC tablet Take 325 mg by mouth daily. (Patient not taking: Reported on 03/27/2020)    . metaxalone (SKELAXIN) 800 MG tablet Take 800 mg by mouth as needed for muscle spasms. (Patient not taking: Reported on 03/27/2020)    . pravastatin (PRAVACHOL) 80 MG tablet Take 80 mg by mouth at bedtime.  (Patient not taking: Reported on 03/27/2020)    . ranitidine (ZANTAC) 75 MG tablet Take 75 mg by mouth 2 (two) times daily as needed for heartburn.  (Patient not taking: Reported on 03/27/2020)     No current facility-administered medications for this visit.     PHYSICAL EXAMINATION: ECOG PERFORMANCE STATUS: 2 - Symptomatic, <50% confined to bed Vitals:   03/27/20 0950  BP: (!) 146/84   Pulse: (!) 58  Resp: 16  Temp: (!) 95.7 F (35.4 C)   Filed Weights   03/27/20 0950  Weight: 121 lb 12.8 oz (55.2 kg)    Physical Exam Constitutional:      General: She is not in acute distress. HENT:     Head: Normocephalic and atraumatic.  Eyes:     General: No scleral icterus. Cardiovascular:     Rate and Rhythm: Normal rate and regular rhythm.     Heart sounds: Normal heart sounds.  Pulmonary:     Effort: Pulmonary effort is normal. No respiratory distress.     Breath sounds: No wheezing.  Abdominal:     General: Bowel sounds are normal. There is no distension.     Palpations: Abdomen is soft.  Musculoskeletal:        General: No deformity. Normal range of motion.     Cervical back: Normal range of motion and neck supple.  Skin:    General: Skin is warm and dry.     Findings: No erythema or rash.  Neurological:     Mental Status: She is alert. Mental status is at baseline.     Cranial Nerves: No cranial nerve deficit.     Coordination: Coordination normal.  Psychiatric:        Mood and Affect: Mood normal.     LABORATORY DATA:  I have reviewed the data as listed Lab Results  Component Value Date   WBC 7.5 03/27/2020   HGB 11.9 (L) 03/27/2020   HCT 36.1 03/27/2020   MCV 93.0 03/27/2020   PLT 264 03/27/2020   Recent Labs    12/04/19 1115 01/11/20 0944 03/27/20 0915  NA 138 138 141  K 4.8 4.8 5.3*  CL 105 104 106  CO2 '23 24 27  '$ GLUCOSE 104* 91 95  BUN 24* 29* 29*  CREATININE 1.69* 1.58* 1.47*  CALCIUM 9.1 10.0 9.9  GFRNONAA 29* 31* 34*  GFRAA 33* 36* 39*  PROT 7.2 7.0 6.6  ALBUMIN 3.9 3.8 3.9  AST '18 15 16  '$ ALT '16 15 16  '$ ALKPHOS 39 39 36*  BILITOT 0.5 0.6 0.6   Iron/TIBC/Ferritin/ %Sat No results found for: IRON, TIBC, FERRITIN, IRONPCTSAT    RADIOGRAPHIC STUDIES: I have personally reviewed the radiological  images as listed and agreed with the findings in the report. CT ABDOMEN PELVIS WO CONTRAST  Result Date: 01/20/2020 CLINICAL  DATA:  Unintentional weight loss, hyperglycemia EXAM: CT CHEST, ABDOMEN AND PELVIS WITHOUT CONTRAST TECHNIQUE: Multidetector CT imaging of the chest, abdomen and pelvis was performed following the standard protocol without IV contrast. COMPARISON:  None. FINDINGS: CT CHEST FINDINGS Cardiovascular: The heart is normal in size. Small pericardial effusion. No evidence of thoracic aortic aneurysm. Atherosclerotic calcifications of the aortic arch. Three vessel coronary atherosclerosis. Mediastinum/Nodes: No suspicious mediastinal lymphadenopathy. Right thyroid is grossly unremarkable. Left thyroid is not discretely visualized. Lungs/Pleura: 3 mm triangular subpleural nodule in the posterior left upper lobe (series 3/image 43). 2 mm nodule in the posterior left upper lobe (series 3/image 53). 2 mm subpleural nodule in the lateral right upper lobe (series 3/image 69). Bronchiectasis with mild subpleural scarring/atelectasis in the bilateral lower lobes. No focal consolidation. No pleural effusion or pneumothorax. Musculoskeletal: Mild degenerative changes of the thoracic spine with exaggerated upper/mid thoracic kyphosis. CT ABDOMEN PELVIS FINDINGS Hepatobiliary: Unenhanced liver is unremarkable. Gallbladder is unremarkable. No intrahepatic or extrahepatic ductal dilatation. Pancreas: Within normal limits. Spleen: Within normal limits. Adrenals/Urinary Tract: Adrenal glands are within normal limits. Dominant 4.1 x 4.8 cm lobulated cyst in the medial left upper kidney (series 2/image 53). Additional 1.7 cm lesion in the lateral left lower kidney which favors a minimally complex/proteinaceous cyst (series 2/image 62), although technically indeterminate. 2.2 x 2.6 x 2.4 cm hyperdense lesion in the posterolateral left upper kidney (series 2/image 59), possibly reflecting a solid renal mass such as renal cell carcinoma, incompletely characterized on unenhanced CT. Right kidney is unremarkable. No renal, ureteral, or bladder  calculi. No hydronephrosis. Bladder is within normal limits. Stomach/Bowel: Stomach is within normal limits. Mild wall thickening involving the 2nd portion of the duodenum (series 2/image 66), without discrete mass on unenhanced CT, correlate for peptic ulcer disease/inflammation. No evidence of small bowel obstruction. Appendix is not discretely visualized. Sigmoid diverticulosis, without evidence of diverticulitis. No colonic wall thickening or mass is evident on CT. Vascular/Lymphatic: No evidence of abdominal aortic aneurysm. Atherosclerotic calcifications of the abdominal aorta and branch vessels. No suspicious abdominopelvic lymphadenopathy. Reproductive: Status post hysterectomy. Right ovary is within normal limits.  No left adnexal mass. Other: No abdominopelvic ascites. Musculoskeletal: Lumbar spine is within normal limits. IMPRESSION: 2.6 cm hyperdense lesion in the posterolateral left upper kidney, raising concern for solid renal mass such as renal cell carcinoma, although incompletely characterized on unenhanced CT. MRI abdomen is suggested for further evaluation (with/without contrast if GFR greater than 30). Mild wall thickening involving the 2nd portion of the duodenum, without discrete mass on CT, favoring inflammatory changes possibly related to peptic ulcer disease. Consider upper endoscopy for further evaluation as clinically warranted. Scattered small bilateral pulmonary nodules measuring up to 3 mm, likely benign. These are not considered suspicious for metastatic disease and given that the patient has known malignancy at this time, Fleischner Society guidelines will be provided. No follow-up needed if patient is low-risk (and has no known or suspected primary neoplasm). Non-contrast chest CT can be considered in 12 months if patient is high-risk. This recommendation follows the consensus statement: Guidelines for Management of Incidental Pulmonary Nodules Detected on CT Images: From the  Fleischner Society 2017; Radiology 2017; 284:228-243. Additional ancillary findings as above. Electronically Signed   By: Julian Hy M.D.   On: 01/20/2020 19:27   CT Chest Wo Contrast  Result Date: 01/20/2020 CLINICAL DATA:  Unintentional  weight loss, hyperglycemia EXAM: CT CHEST, ABDOMEN AND PELVIS WITHOUT CONTRAST TECHNIQUE: Multidetector CT imaging of the chest, abdomen and pelvis was performed following the standard protocol without IV contrast. COMPARISON:  None. FINDINGS: CT CHEST FINDINGS Cardiovascular: The heart is normal in size. Small pericardial effusion. No evidence of thoracic aortic aneurysm. Atherosclerotic calcifications of the aortic arch. Three vessel coronary atherosclerosis. Mediastinum/Nodes: No suspicious mediastinal lymphadenopathy. Right thyroid is grossly unremarkable. Left thyroid is not discretely visualized. Lungs/Pleura: 3 mm triangular subpleural nodule in the posterior left upper lobe (series 3/image 43). 2 mm nodule in the posterior left upper lobe (series 3/image 53). 2 mm subpleural nodule in the lateral right upper lobe (series 3/image 69). Bronchiectasis with mild subpleural scarring/atelectasis in the bilateral lower lobes. No focal consolidation. No pleural effusion or pneumothorax. Musculoskeletal: Mild degenerative changes of the thoracic spine with exaggerated upper/mid thoracic kyphosis. CT ABDOMEN PELVIS FINDINGS Hepatobiliary: Unenhanced liver is unremarkable. Gallbladder is unremarkable. No intrahepatic or extrahepatic ductal dilatation. Pancreas: Within normal limits. Spleen: Within normal limits. Adrenals/Urinary Tract: Adrenal glands are within normal limits. Dominant 4.1 x 4.8 cm lobulated cyst in the medial left upper kidney (series 2/image 53). Additional 1.7 cm lesion in the lateral left lower kidney which favors a minimally complex/proteinaceous cyst (series 2/image 62), although technically indeterminate. 2.2 x 2.6 x 2.4 cm hyperdense lesion in the  posterolateral left upper kidney (series 2/image 59), possibly reflecting a solid renal mass such as renal cell carcinoma, incompletely characterized on unenhanced CT. Right kidney is unremarkable. No renal, ureteral, or bladder calculi. No hydronephrosis. Bladder is within normal limits. Stomach/Bowel: Stomach is within normal limits. Mild wall thickening involving the 2nd portion of the duodenum (series 2/image 66), without discrete mass on unenhanced CT, correlate for peptic ulcer disease/inflammation. No evidence of small bowel obstruction. Appendix is not discretely visualized. Sigmoid diverticulosis, without evidence of diverticulitis. No colonic wall thickening or mass is evident on CT. Vascular/Lymphatic: No evidence of abdominal aortic aneurysm. Atherosclerotic calcifications of the abdominal aorta and branch vessels. No suspicious abdominopelvic lymphadenopathy. Reproductive: Status post hysterectomy. Right ovary is within normal limits.  No left adnexal mass. Other: No abdominopelvic ascites. Musculoskeletal: Lumbar spine is within normal limits. IMPRESSION: 2.6 cm hyperdense lesion in the posterolateral left upper kidney, raising concern for solid renal mass such as renal cell carcinoma, although incompletely characterized on unenhanced CT. MRI abdomen is suggested for further evaluation (with/without contrast if GFR greater than 30). Mild wall thickening involving the 2nd portion of the duodenum, without discrete mass on CT, favoring inflammatory changes possibly related to peptic ulcer disease. Consider upper endoscopy for further evaluation as clinically warranted. Scattered small bilateral pulmonary nodules measuring up to 3 mm, likely benign. These are not considered suspicious for metastatic disease and given that the patient has known malignancy at this time, Fleischner Society guidelines will be provided. No follow-up needed if patient is low-risk (and has no known or suspected primary neoplasm).  Non-contrast chest CT can be considered in 12 months if patient is high-risk. This recommendation follows the consensus statement: Guidelines for Management of Incidental Pulmonary Nodules Detected on CT Images: From the Fleischner Society 2017; Radiology 2017; 284:228-243. Additional ancillary findings as above. Electronically Signed   By: Charline Bills M.D.   On: 01/20/2020 19:27   MR Abdomen W Wo Contrast  Result Date: 02/01/2020 CLINICAL DATA:  Renal mass evaluation. Follow-up scan. EXAM: MRI ABDOMEN WITHOUT AND WITH CONTRAST TECHNIQUE: Multiplanar multisequence MR imaging of the abdomen was performed both before and  after the administration of intravenous contrast. CONTRAST:  42m GADAVIST GADOBUTROL 1 MMOL/ML IV SOLN COMPARISON:  CT evaluation of 01/19/2020 FINDINGS: Lower chest: Cardiac enlargement with pericardial fluid similar to the recent CT evaluation. No consolidation or pleural effusion. Limited assessment of lung bases on MRI. Hepatobiliary: No focal suspicious hepatic lesion. No biliary ductal dilation. Pancreas:  Normal without ductal dilation, inflammation or lesion. Spleen:  Spleen normal size without focal lesion. Adrenals/Urinary Tract:  Adrenal glands are normal. Bilateral renal cysts. Cortical scarring worse on the LEFT. Lesion of concern arising from the lateral LEFT kidney represents hemorrhagic or proteinaceous cyst measuring 2.5 by 2.3 cm. Largest cyst arising from the upper pole and smaller cysts in the LEFT kidney as well with no signs of protein or hemorrhage. Small cyst along the lateral cortex of the RIGHT kidney. Stomach/Bowel: Limited assessment of gastrointestinal tract is normal to the extent visualized with colonic diverticulosis mainly in the sigmoid colon. Vascular/Lymphatic: No aneurysmal dilation of the abdominal aorta. No adenopathy in the retroperitoneum. Other:  No abdominal ascites. Musculoskeletal: No acute or destructive bone process. IMPRESSION: 1. Lesion of  concern arising from the lateral LEFT kidney represents a hemorrhagic or proteinaceous cyst. Benign lesion (Bosniak category 2). 2. Cysts elsewhere in the LEFT kidney with signs of asymmetric cortical scarring. 3. Small lesions in the RIGHT kidney also compatible with cysts. 4. Bilateral renal cortical scarring. 5. Colonic diverticulosis. Electronically Signed   By: GZetta BillsM.D.   On: 02/01/2020 13:32      ASSESSMENT & PLAN:  1. Kidney mass   2. Stage 4 chronic kidney disease [place]   3. Hypercalcemia    #Left kidney mass, MRI was independently reviewed by me discussed with patient/husband.  Lesion of concern arising from lateral left kidney represents likely benign lesion.  Smaller right kidney lesions compatible with cysts.  Chronic diverticulosis.   #Unintentional weight loss, Weight loss has improved since last visit.  Her husband, appetite has improved.  Likely due to gastritis.  #Hypercalcemia Normal PTH, 25-hydroxy vitamin D level, no M protein on multiple myeloma panel. Negative PTH RP, normal 1,25 hydroxy vitamin D level  Work-up is negative.  Calcium is normal today.  We will hold off additional work-up at this point.  #CKD, avoid nephrotoxins.  All questions were answered. The patient knows to call the clinic with any problems questions or concerns.  I will discharge patient at this point given that she is doing really well and work-up has been negative.  I am happy to see her in the future if she develops new symptoms or concerns related to hematology oncology..  Patient and husband agree with the plan.  ZEarlie Server MD, PhD Hematology Oncology CNorth Suburban Medical Centerat ASweetwater Hospital AssociationPager- [phone]/16/2021

## 2021-10-28 ENCOUNTER — Other Ambulatory Visit: Payer: Self-pay | Admitting: Internal Medicine

## 2021-10-28 DIAGNOSIS — Z1231 Encounter for screening mammogram for malignant neoplasm of breast: Secondary | ICD-10-CM

## 2021-11-19 ENCOUNTER — Ambulatory Visit
Admission: RE | Admit: 2021-11-19 | Discharge: 2021-11-19 | Disposition: A | Discharge status: Home / Self Care | Payer: Medicare HMO | Source: Ambulatory Visit | Attending: Internal Medicine | Admitting: Internal Medicine

## 2021-11-19 ENCOUNTER — Other Ambulatory Visit: Payer: Self-pay

## 2021-11-19 DIAGNOSIS — Z1231 Encounter for screening mammogram for malignant neoplasm of breast: Secondary | ICD-10-CM | POA: Diagnosis not present

## 2022-10-30 IMAGING — MG MM DIGITAL SCREENING BILAT W/ TOMO AND CAD
6 of 12 series · 6 of 36 positions shown · non-contrast
Comparison: Previous exam(s).

CLINICAL DATA: Screening.

EXAM:
DIGITAL SCREENING BILATERAL MAMMOGRAM WITH TOMOSYNTHESIS AND CAD
TECHNIQUE: Bilateral screening digital craniocaudal and mediolateral oblique
mammograms were obtained. Bilateral screening digital breast
tomosynthesis was performed. The images were evaluated with
computer-aided detection.

[L CC synth-2D (1 of 2)]
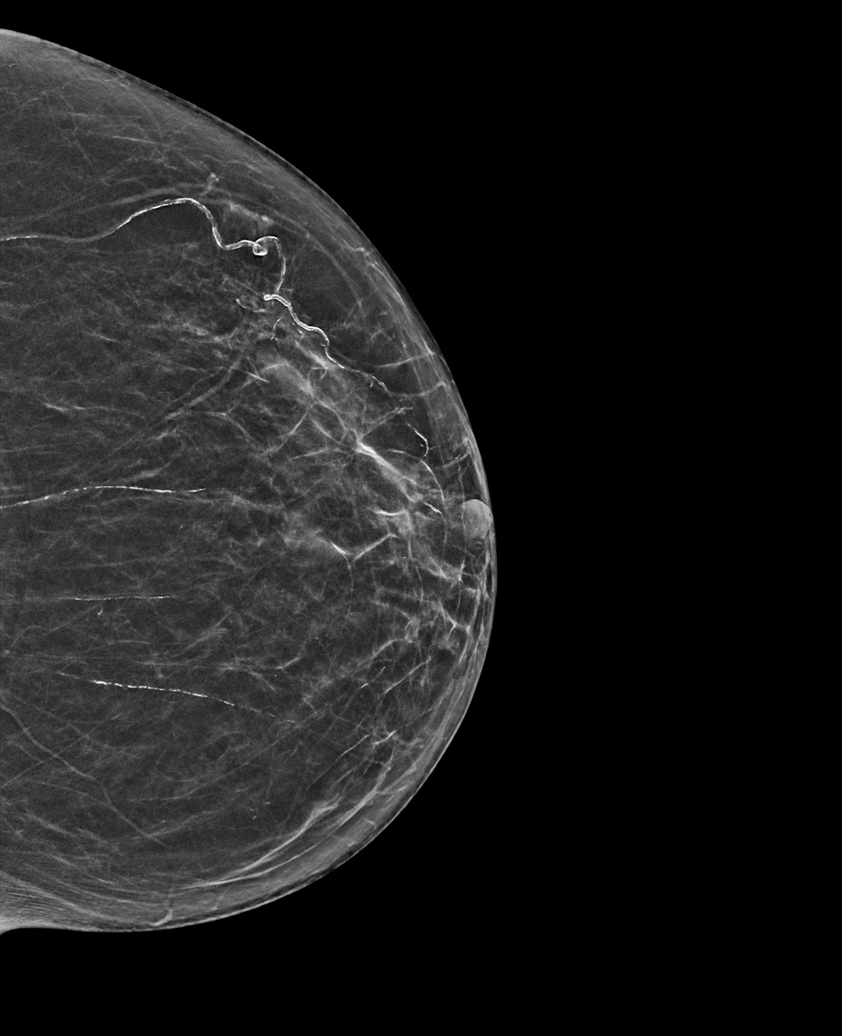

[L MLO synth-2D]
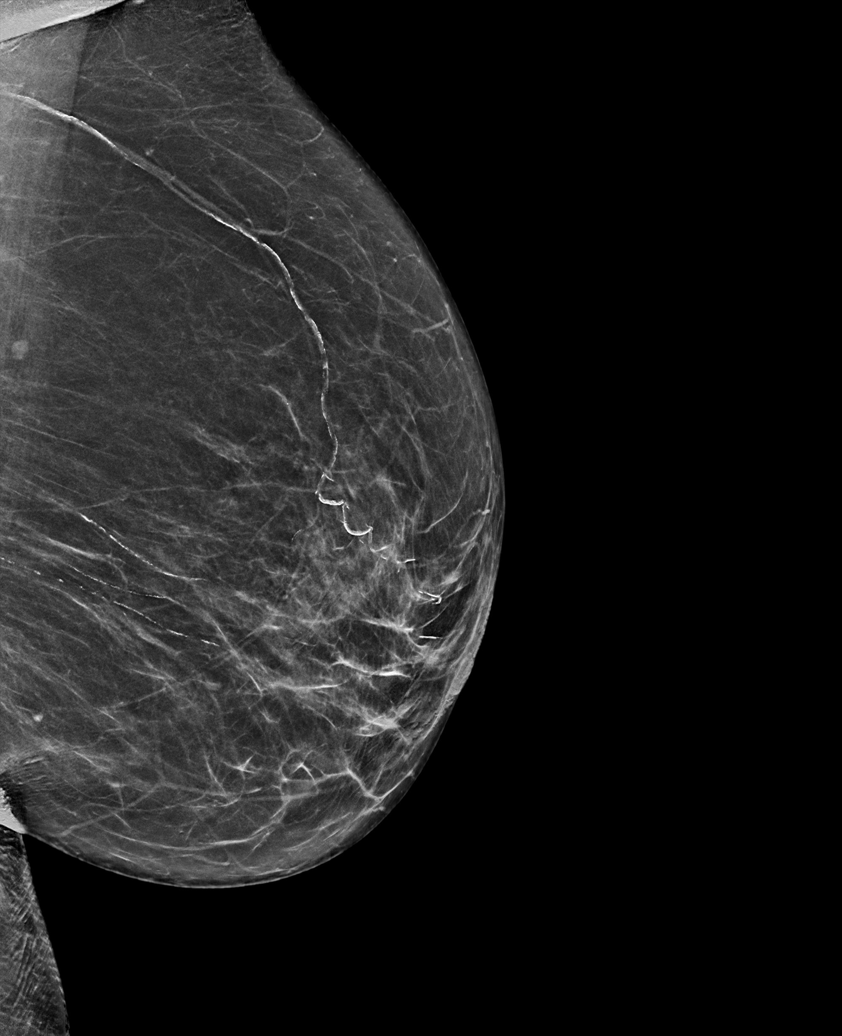

[R CC synth-2D (1 of 2)]
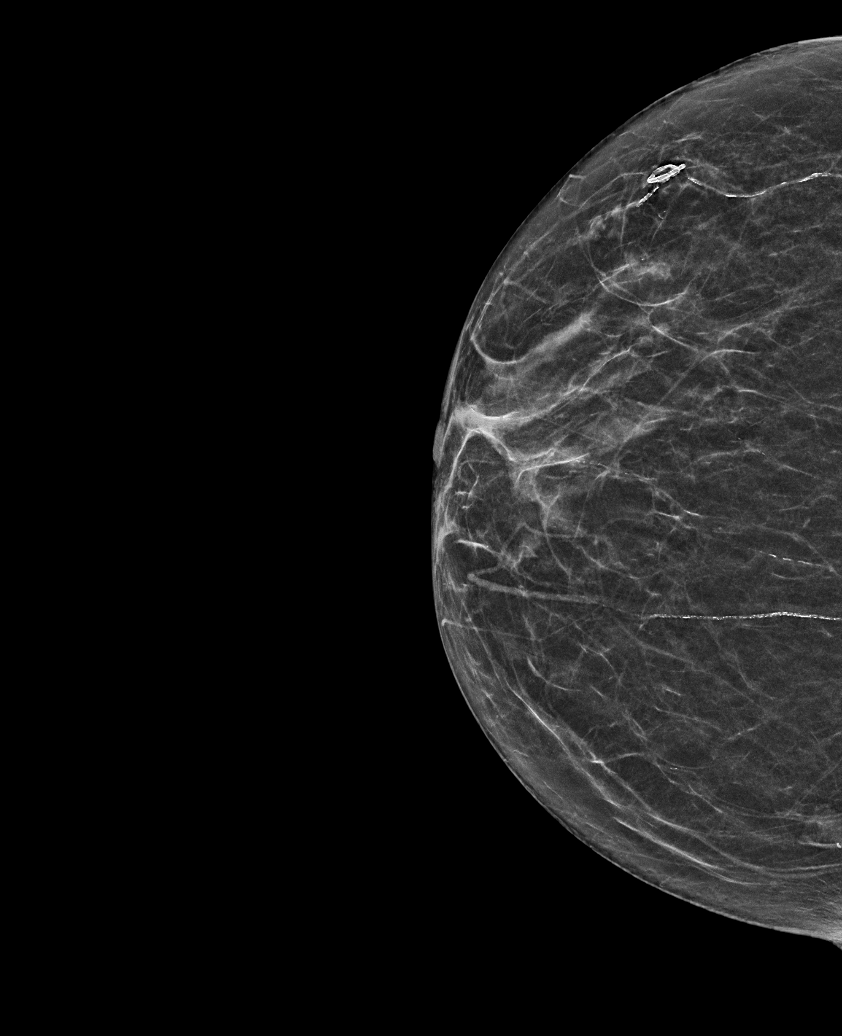

[R MLO synth-2D]
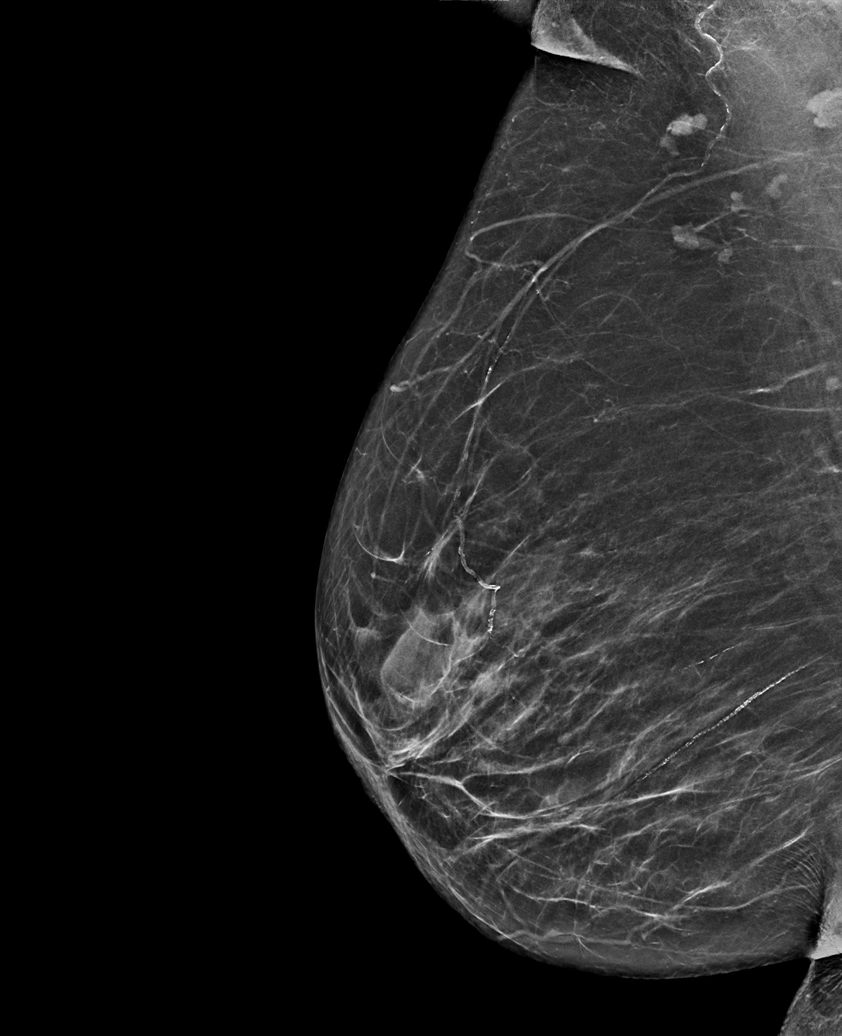

[L CC synth-2D (2 of 2)]
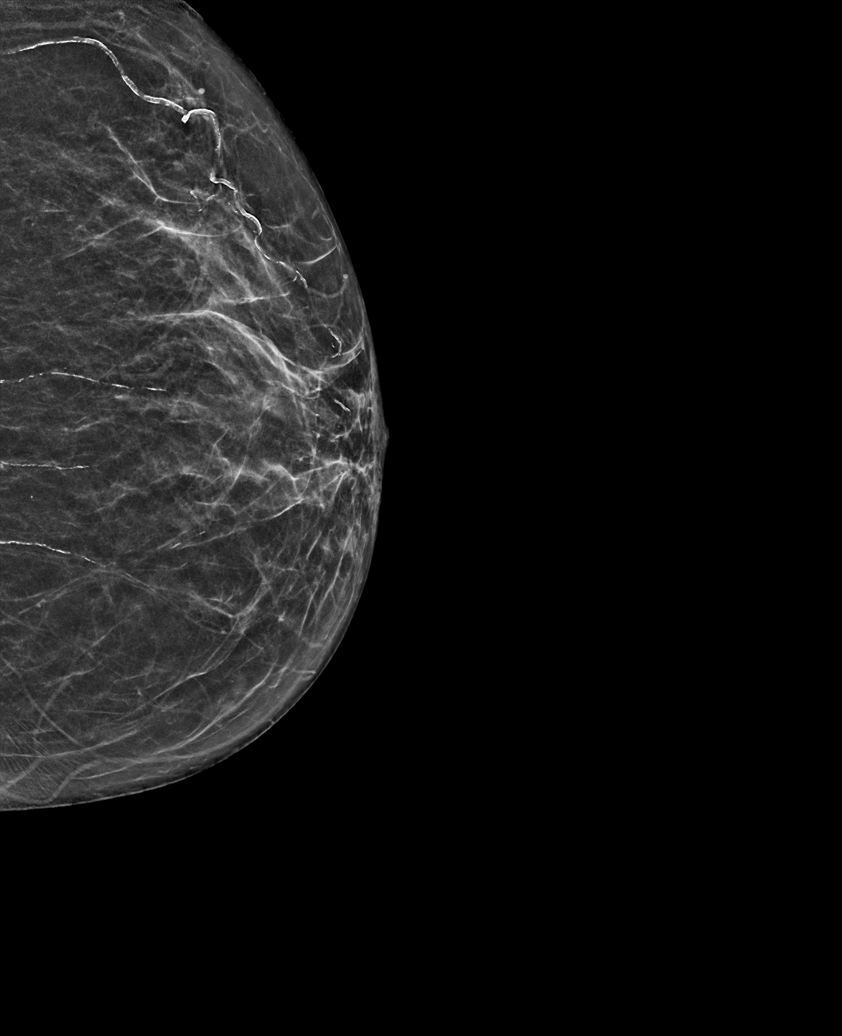

[R CC synth-2D (2 of 2)]
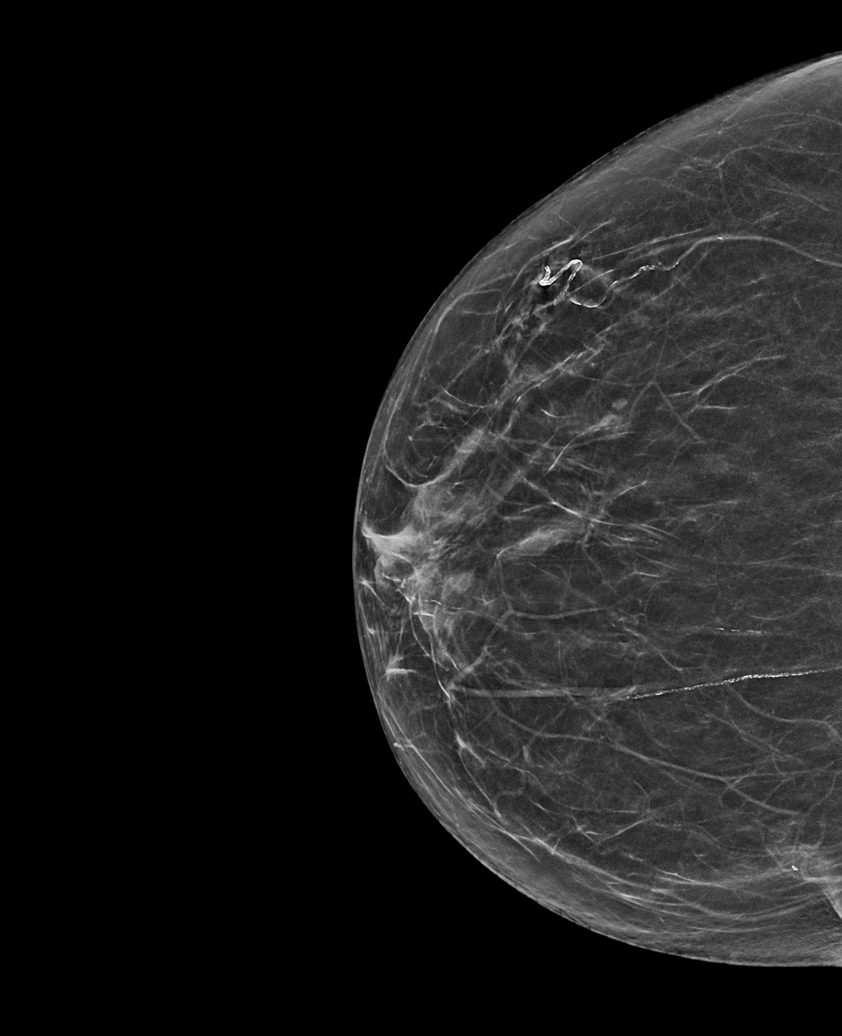

[6 of 36 positions shown; findings below may reference images not displayed]

ACR Breast Density Category b: There are scattered areas of
fibroglandular density.
FINDINGS: There are no findings suspicious for malignancy.
IMPRESSION: No mammographic evidence of malignancy. A result letter of this
screening mammogram will be mailed directly to the patient.

RECOMMENDATION:
Screening mammogram in one year. (Code:51-O-LD2)

BI-RADS CATEGORY  1: Negative.

## 2023-10-20 DIAGNOSIS — R55 Syncope and collapse: Secondary | ICD-10-CM | POA: Diagnosis not present

## 2023-10-20 DIAGNOSIS — R600 Localized edema: Secondary | ICD-10-CM | POA: Diagnosis not present

## 2023-10-20 DIAGNOSIS — Z23 Encounter for immunization: Secondary | ICD-10-CM | POA: Diagnosis not present

## 2023-10-20 DIAGNOSIS — E782 Mixed hyperlipidemia: Secondary | ICD-10-CM | POA: Diagnosis not present

## 2023-10-20 DIAGNOSIS — I10 Essential (primary) hypertension: Secondary | ICD-10-CM | POA: Diagnosis not present

## 2023-10-26 DIAGNOSIS — H903 Sensorineural hearing loss, bilateral: Secondary | ICD-10-CM | POA: Diagnosis not present

## 2023-10-26 DIAGNOSIS — H6123 Impacted cerumen, bilateral: Secondary | ICD-10-CM | POA: Diagnosis not present

## 2023-12-06 DIAGNOSIS — M170 Bilateral primary osteoarthritis of knee: Secondary | ICD-10-CM | POA: Diagnosis not present

## 2023-12-06 DIAGNOSIS — M21062 Valgus deformity, not elsewhere classified, left knee: Secondary | ICD-10-CM | POA: Diagnosis not present

## 2023-12-06 DIAGNOSIS — M21061 Valgus deformity, not elsewhere classified, right knee: Secondary | ICD-10-CM | POA: Diagnosis not present

## 2023-12-14 DIAGNOSIS — H9193 Unspecified hearing loss, bilateral: Secondary | ICD-10-CM | POA: Diagnosis not present

## 2023-12-14 DIAGNOSIS — E559 Vitamin D deficiency, unspecified: Secondary | ICD-10-CM | POA: Diagnosis not present

## 2023-12-14 DIAGNOSIS — E039 Hypothyroidism, unspecified: Secondary | ICD-10-CM | POA: Diagnosis not present

## 2023-12-14 DIAGNOSIS — G8929 Other chronic pain: Secondary | ICD-10-CM | POA: Diagnosis not present

## 2023-12-14 DIAGNOSIS — E669 Obesity, unspecified: Secondary | ICD-10-CM | POA: Diagnosis not present

## 2023-12-14 DIAGNOSIS — E538 Deficiency of other specified B group vitamins: Secondary | ICD-10-CM | POA: Diagnosis not present

## 2023-12-14 DIAGNOSIS — E785 Hyperlipidemia, unspecified: Secondary | ICD-10-CM | POA: Diagnosis not present

## 2023-12-14 DIAGNOSIS — N1832 Chronic kidney disease, stage 3b: Secondary | ICD-10-CM | POA: Diagnosis not present

## 2023-12-14 DIAGNOSIS — N2581 Secondary hyperparathyroidism of renal origin: Secondary | ICD-10-CM | POA: Diagnosis not present

## 2023-12-14 DIAGNOSIS — I129 Hypertensive chronic kidney disease with stage 1 through stage 4 chronic kidney disease, or unspecified chronic kidney disease: Secondary | ICD-10-CM | POA: Diagnosis not present

## 2023-12-14 DIAGNOSIS — I10 Essential (primary) hypertension: Secondary | ICD-10-CM | POA: Diagnosis not present

## 2023-12-14 DIAGNOSIS — M1990 Unspecified osteoarthritis, unspecified site: Secondary | ICD-10-CM | POA: Diagnosis not present

## 2023-12-20 DIAGNOSIS — B351 Tinea unguium: Secondary | ICD-10-CM | POA: Diagnosis not present

## 2023-12-20 DIAGNOSIS — M79674 Pain in right toe(s): Secondary | ICD-10-CM | POA: Diagnosis not present

## 2023-12-20 DIAGNOSIS — M79675 Pain in left toe(s): Secondary | ICD-10-CM | POA: Diagnosis not present

## 2023-12-24 DIAGNOSIS — E038 Other specified hypothyroidism: Secondary | ICD-10-CM | POA: Diagnosis not present

## 2023-12-24 DIAGNOSIS — N184 Chronic kidney disease, stage 4 (severe): Secondary | ICD-10-CM | POA: Diagnosis not present

## 2023-12-24 DIAGNOSIS — R739 Hyperglycemia, unspecified: Secondary | ICD-10-CM | POA: Diagnosis not present

## 2023-12-24 DIAGNOSIS — I10 Essential (primary) hypertension: Secondary | ICD-10-CM | POA: Diagnosis not present

## 2023-12-24 DIAGNOSIS — I129 Hypertensive chronic kidney disease with stage 1 through stage 4 chronic kidney disease, or unspecified chronic kidney disease: Secondary | ICD-10-CM | POA: Diagnosis not present

## 2023-12-31 DIAGNOSIS — E559 Vitamin D deficiency, unspecified: Secondary | ICD-10-CM | POA: Diagnosis not present

## 2023-12-31 DIAGNOSIS — N3281 Overactive bladder: Secondary | ICD-10-CM | POA: Diagnosis not present

## 2023-12-31 DIAGNOSIS — N184 Chronic kidney disease, stage 4 (severe): Secondary | ICD-10-CM | POA: Diagnosis not present

## 2023-12-31 DIAGNOSIS — I129 Hypertensive chronic kidney disease with stage 1 through stage 4 chronic kidney disease, or unspecified chronic kidney disease: Secondary | ICD-10-CM | POA: Diagnosis not present

## 2023-12-31 DIAGNOSIS — Z0000 Encounter for general adult medical examination without abnormal findings: Secondary | ICD-10-CM | POA: Diagnosis not present

## 2023-12-31 DIAGNOSIS — N2581 Secondary hyperparathyroidism of renal origin: Secondary | ICD-10-CM | POA: Diagnosis not present

## 2023-12-31 DIAGNOSIS — I10 Essential (primary) hypertension: Secondary | ICD-10-CM | POA: Diagnosis not present

## 2023-12-31 DIAGNOSIS — E782 Mixed hyperlipidemia: Secondary | ICD-10-CM | POA: Diagnosis not present

## 2023-12-31 DIAGNOSIS — E875 Hyperkalemia: Secondary | ICD-10-CM | POA: Diagnosis not present

## 2023-12-31 NOTE — Progress Notes (Signed)
 Laura Bishop is a 83 y.o. here for Medicare Wellness Visit and annual physical  MEDICARE WELLNESS VISIT Providers Rendering Care 1. Dr. Lavenia Beaver (PCP) 2.  Cardiology  Functional Assessment  (1) Hearing: Demonstrates mild  difficulty in hearing during normal conversation-uses bilateral hearing aids (2) Risk of Falls: Patient denies any falls or near falls in the last year, Gait slow and steady with her cane during walk from waiting area to exam room (3) Home Safety: Patient feels secure in their home, There are operational smoke alarms in multiple areas of the home (4) Activities of Daily Living: Independently manages personal grooming and household chores, including cooking, cleaning and laundry. Manages Personal finances without assistance.  PHQ 2/9 from today's flowsheet  PHQ-2 PHQ-2 Over the last 2 weeks, how often have you been bothered by any of the following problems? Little interest or pleasure in doing things: Not at all Feeling down, depressed, or hopeless: Not at all Patient Health Questionnaire-2 Score: 0  PHQ-9 (if PHQ >=3)    Depression Severity and Treatment Recommendations:  0-4= None  5-9= Mild / Treatment: Support, educate to call if worse; return in one month  10-14= Moderate / Treatment: Support, watchful waiting; Antidepressant or Psychotherapy  15-19= Moderately severe / Treatment: Antidepressant OR Psychotherapy  >= 20 = Major depression, severe / Antidepressant AND Psychotherapy   Depression Screening PHQ 2/9 last 3 flowsheet values    10/28/2021    1:05 PM 11/12/2022   10:13 AM 12/31/2023    8:42 AM  PHQ-2/9 Depression Screening   Little interest or pleasure in doing things   0  Feeling down, depressed, or hopeless   0  Patient Health Questionnaire-2 Score   0  (OBSOLETE) Little interest or pleasure in doing things 0 0   (OBSOLETE) Feeling down, depressed, or hopeless (or irritable for Teens only)? 0 0   (OBSOLETE) Total Prescreening Score  0 0   (OBSOLETE) Total Score = 0 0     Depression Severity and Treatment Recommendations:  0-4= None  5-9= Mild / Treatment: Support, educate to call if worse; return in one month  10-14= Moderate / Treatment: Support, watchful waiting; Antidepressant or Psychotherapy  15-19= Moderately severe / Treatment: Antidepressant OR Psychotherapy  >= 20 = Major depression, severe / Antidepressant AND Psychotherapy   Cognitive Impairment Patient denies episodes of loosing things, being forgetful. Seems oriented to person, place and time. Responses appear appropriate and timely to this observer.    * No data to display           PREVENTION PLAN Cardiovascular:  Lab Results  Component Value Date   LDLCALC 104 12/24/2023   Diabetes:  Lab Results  Component Value Date   HGBA1C 5.7 (H) 12/24/2023   HGBA1C 5.5 11/05/2022   Glaucoma: UTD  Smoking Cessation:  Not Applicable  Other Personalized Health Advice Encouraged patient to exercise 5 days a week, walking, water aerobics, gentle stretching recommended. Increase dietary intake of fresh fruits and vegetables, reduce red meat to twice a week.   Goals     .  Follow my doctor's care plan    . * Maintain health/healthy lifestyle (pt-stated)      Would like to maintain health at this time    . * Patient Goals (pt-stated)      Though the patient indicated a score of 5 on accomplishing her goals, patient reports not having any goals at this time.         End of  Life Counseling Patient has living will in place; [place] - ; DNR  Current Outpatient Medications  Medication Sig Dispense Refill  . acetaminophen  (TYLENOL ) 500 MG tablet Take 1,000 mg by mouth every 8 (eight) hours as needed for Pain       . amLODIPine (NORVASC) 5 MG tablet TAKE 2 TABLETS BY MOUTH ONCE DAILY FOR BLOOD PRESSURE 180 tablet 0  . aspirin 81 MG EC tablet Take 81 mg by mouth once daily.    . cholecalciferol, vitamin D3, (VITAMIN D3) 125 mcg (5,000 unit) tablet  Take 5,000 Units by mouth once daily    . co-enzyme Q-10, ubiquinone, 100 mg capsule Take 100 mg by mouth once daily    . diphenhydramine-acetaminophen  (TYLENOL  PM) 25-500 mg per tablet Take 2 tablets by mouth nightly       . levothyroxine (SYNTHROID) 75 MCG tablet TAKE 1 TABLET BY MOUTH IN THE MORNING FOR THYROID  90 tablet 0  . losartan (COZAAR) 25 MG tablet take 1 tablet by mouth twice daily for blood pressure 180 tablet 1  . multivitamin capsule Take 1 capsule by mouth once daily       . omega 3-dha-epa-fish oil 1,000 mg (120 mg-180 mg) Cap Take 2 capsules by mouth once daily    . rosuvastatin (CRESTOR) 5 MG tablet Take 1 tablet by mouth once daily 90 tablet 0  . tiZANidine (ZANAFLEX) 2 MG capsule Take 1 capsule (2 mg total) by mouth at bedtime as needed 30 capsule 1  . TURMERIC ORAL Take 500 mg by mouth once daily    . oxyBUTYnin (DITROPAN-XL) 10 MG XL tablet Take 1 tablet (10 mg total) by mouth once daily (Patient not taking: Reported on 12/31/2023) 90 tablet 1   No current facility-administered medications for this visit.    Allergies as of 12/31/2023 - Reviewed 12/31/2023  Allergen Reaction Noted  . Nsaids (non-steroidal anti-inflammatory drug) Other (See Comments) 03/20/2014  . Estrogens Unknown 03/20/2014    Patient Active Problem List  Diagnosis  . Hypertension  . Hypothyroidism  . Chronic kidney disease, stage III (moderate) (CMS/HHS-HCC)  . Osteopenia of multiple sites  . Hyperlipidemia  . Iron deficiency anemia  . Vitamin D deficiency  . Overactive bladder  . High risk medication use  . Primary osteoarthritis of both knees  . Chronic bilateral low back pain without sciatica  . Vasovagal syncope  . Dizziness  . Arachnoid cyst  . Bradycardia  . Peripheral edema  . Secondary hyperparathyroidism, renal (CMS/HHS-HCC)  . Hypercalcemia    Past Medical History:  Diagnosis Date  . Chronic kidney disease, stage III (moderate) (CMS/HHS-HCC)    Mild  . History of  cataract 2014 and2017  . Hyperlipidemia   . Hypertension   . Hypothyroidism    Discharged from follow up by Dr. Juengel  . Iron deficiency anemia   . Meningioma (CMS/HHS-HCC)    thoracic  . OA (osteoarthritis) of knee   . Osteoarthritis of knee   . Osteoporosis, post-menopausal   . Overactive bladder   . Vitamin D deficiency     Past Surgical History:  Procedure Laterality Date  . HYSTERECTOMY  1985   for fibroids, does have one ovary  . EGD  08/30/2002   No repeat per RTE  . COLONOSCOPY  08/30/2002   Adenomatous Polyp  . COLONOSCOPY  02/22/2006   PH Adenomatous Polyp  . COLONOSCOPY  06/08/2011   Adenomatous Polyps: [facility] 05/2016; Recall Ltr mailed 04/09/2016 (dw)  . LOBECTOMY TOTAL THYROID  Left 05/2012  Dr. Juengel  . EXTRACTION CATARACT EXTRACAPSULAR Left 11/2013  . EXTRACTION CATARACT EXTRACAPSULAR Right 08/2014  . COLONOSCOPY  07/20/2016   Adenomatous Polyps: CBF 07/2019  . EGD  07/20/2016   Gastritis, Esophagitis: No repeat per RTE  . COLONOSCOPY  02/27/2019   Adenomatous Polyps: CBF 02/2024  . EGD  01/29/2020   Gastritis/No Repeat/TKT  . APPENDECTOMY    . CATARACT EXTRACTION  2014/2017  . resection meningioma of thoracic spine      Health Maintenance  Topic Date Due  . RSV Immunization Pregnant or 60+ (1 - 1-dose 75+ series) Never done  . Medicare Initial or AWV  11/13/2023  . Mammogram  11/20/2023  . Colonoscopy  02/27/2024  . COVID-19 Vaccine (6 - 2024-25 season) 01/09/2024  . Adult Tetanus (Td And Tdap)  10/08/2024  . Creatinine Level  12/23/2024  . Potassium Level  12/23/2024  . TSH Level  12/23/2024  . Lipid Panel  12/23/2024  . Annual Urine Albumin Creatinine Ratio  12/23/2024  . Serum Bicarbonate  12/23/2024  . Serum Phosphorus  12/23/2024  . Parathyroid Hormone  12/23/2024  . Serum Calcium  12/23/2024  . Depression Screening  12/30/2024  . DXA Bone Density Scan  12/12/2026  . Diabetes Screening  12/24/2026  . Pneumococcal Vaccine: 50+   Completed  . Shingrix  Completed  . Influenza Vaccine  Completed  . Hib Vaccines  Aged Out  . Hepatitis A Vaccines  Aged Out  . Meningococcal B Vaccine  Aged Out  . Meningococcal ACWY Vaccine  Aged Out  . HPV Vaccines  Aged Out    Social History:  reports that she has never smoked. She has been exposed to tobacco smoke. She has never used smokeless tobacco. She reports that she does not drink alcohol and does not use drugs.  Vitals:   12/31/23 0844  BP: 134/78  Pulse: 54  SpO2: 97%  Weight: 69.9 kg (154 lb)  Height: 152.4 cm (5')  PainSc: 0-No pain   Body mass index is 30.08 kg/m.   Goals     .  Follow my doctor's care plan    . * Maintain health/healthy lifestyle (pt-stated)      Would like to maintain health at this time    . * Patient Goals (pt-stated)      Though the patient indicated a score of 5 on accomplishing her goals, patient reports not having any goals at this time.          Subjective:  History of Present Illness Laura Bishop is an 83 year old female who presents for annual wellness visit.  Lives at home with her husband.  Independent at baseline. She does not drive usually.  No recent falls, ambulates with her cane.  No passing out episodes that she had trouble with syncope in the past.  Complains of nocturia, tried oxybutynin which has not helped.  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  She takes low-dose losartan daily.  She also has history of left kidney mass.  Which turned out to be benign.  She has history of subtotal thyroidectomy on thyroid  supplements.     ROS:  General: No fever, chills or recent illness. No change in weight Skin:   No skin lesions, growths, masses, rashes, pruritus  HEENT: No change in vision.  Uses glasses, uses bilateral hearing aids.. No pain or difficulty with swallowing Respiratory: No cough or shortness of breath CV:  No  chest pain or palpitations GI:  No pain,  dyspepsia or change in bowel habits GU:  No dysuria, frequency, or hesitancy, complains of nocturia MSK:  No joint pain or injury.  Arthritis in both hands Neurological: No headaches, changes in mental status, loss of sensation or strength  Endocrine:  No heat or cold intolerance, polydipsia, polyuria Psychological: No anxiety insomnia or depression  Objective:   Body mass index is 30.08 kg/m.  BP 134/78   Pulse 54   Ht 152.4 cm (5')   Wt 69.9 kg (154 lb)   SpO2 97%   BMI 30.08 kg/m   General: WD/WN female, in no acute distress HEENT: Pupils equal and round, EOMI.  OP moist without lesions Neck: supple, trachea midline; no thyromegaly Chest: normal to palpation Lungs:clear to auscultation without wheeze or retraction Cardiac:  Regular rate and rhythm without murmur, gallops, or rubs Vascular: No carotid bruit and radials 2+; distal pulses 2+ Abdominal:soft, nontender, positive bowel sounds.  No guarding or rebound tenderness Extremities:  No clubbing, cyanosis or edema, has arthritis of both hands. Neuro: CN grossly intact.  Gait intact with her cane.  No acute motor or sensory deficits Dermatologic: no significant rashes or nodules Lymph: no cervical or supraclavicular lymphadenopathy   Assessment/Plan:   Medicare annual wellness visit, subsequent  (primary encounter diagnosis) Annual physical exam OAB (overactive bladder) CKD stage 4 secondary to hypertension (CMS/HHS-HCC) Mixed hyperlipidemia Essential hypertension Secondary hyperparathyroidism, renal (CMS/HHS-HCC) Vitamin D deficiency  Assessment and Plan  Assessment & Plan    1. Medicare wellness visit and annual physical-  -Medications and allergies reviewed. Copy of preventative health provided.  - Labs reviewed. -Discussed current concerns -Discussed diet activity, living situation. -Discussed health maintenance.  Aged out for mammograms and colonoscopy.  Wants to hold off for now -Up-to-date with  immunizations.  2.  Hypertension- -Well-controlled -Low-salt diet -On amlodipine and losartan -No beta-blockers given history of sinus bradycardia and history of syncope and presyncope.  Following with cardiology -Concern for irregular rhythm and bradycardia in the past.  3. Overactive Bladder (OAB)- She experiences nocturia, waking up six or more times nightly.  -No infection identified. -Did not improve with oxybutynin. -Will try Vesicare.   4. Chronic Kidney Disease (CKD) Stage 4 - She has CKD stage 4 secondary to hypertension.  -Stable, currently not seeing a nephrologist. -Not sure about dialysis in the future. -Avoid nephrotoxins, NSAIDs.  5.  Hyperkalemia- -Elevated potassium on most recent labs.  Usually has borderline high potassium given her CKD.  Takes low-dose losartan as well. -Potassium restricted diet recommended.  Repeat potassium labs today.  If needed will call in [place] and hold the losartan temporarily.      Follow-up with me in 6 months for hypertension, CKD stage IV, overactive bladder and hyperkalemia    RADHIKA KALISETTI, MD   *Some images could not be shown.

## 2024-01-27 DIAGNOSIS — E875 Hyperkalemia: Secondary | ICD-10-CM | POA: Diagnosis not present

## 2024-02-23 DIAGNOSIS — H6123 Impacted cerumen, bilateral: Secondary | ICD-10-CM | POA: Diagnosis not present

## 2024-02-23 DIAGNOSIS — H903 Sensorineural hearing loss, bilateral: Secondary | ICD-10-CM | POA: Diagnosis not present

## 2024-03-27 DIAGNOSIS — B351 Tinea unguium: Secondary | ICD-10-CM | POA: Diagnosis not present

## 2024-04-10 DIAGNOSIS — M21062 Valgus deformity, not elsewhere classified, left knee: Secondary | ICD-10-CM | POA: Diagnosis not present

## 2024-04-10 DIAGNOSIS — M21061 Valgus deformity, not elsewhere classified, right knee: Secondary | ICD-10-CM | POA: Diagnosis not present

## 2024-04-10 DIAGNOSIS — M170 Bilateral primary osteoarthritis of knee: Secondary | ICD-10-CM | POA: Diagnosis not present

## 2024-06-20 DIAGNOSIS — H903 Sensorineural hearing loss, bilateral: Secondary | ICD-10-CM | POA: Diagnosis not present

## 2024-06-20 DIAGNOSIS — H6123 Impacted cerumen, bilateral: Secondary | ICD-10-CM | POA: Diagnosis not present

## 2024-07-03 DIAGNOSIS — M79675 Pain in left toe(s): Secondary | ICD-10-CM | POA: Diagnosis not present

## 2024-07-03 DIAGNOSIS — M79674 Pain in right toe(s): Secondary | ICD-10-CM | POA: Diagnosis not present

## 2024-07-03 DIAGNOSIS — B351 Tinea unguium: Secondary | ICD-10-CM | POA: Diagnosis not present

## 2024-07-03 NOTE — Progress Notes (Signed)
 Subjective: Pt. returns with painful toenails on all toes causing pressure in the shoes when walking.  Objective: All of the toenails are thick, dystrophic, discolored, and brittle with subungual debris.  Impression: Painful onychomycosis all toes.  Plan: Debridement of all toenails in length and thickness sharply using toenail nippers.  Pt will return PRN.

## 2024-07-12 DIAGNOSIS — Z961 Presence of intraocular lens: Secondary | ICD-10-CM | POA: Diagnosis not present

## 2024-07-12 DIAGNOSIS — H04123 Dry eye syndrome of bilateral lacrimal glands: Secondary | ICD-10-CM | POA: Diagnosis not present

## 2024-07-12 DIAGNOSIS — Z9889 Other specified postprocedural states: Secondary | ICD-10-CM | POA: Diagnosis not present

## 2024-07-12 DIAGNOSIS — H43813 Vitreous degeneration, bilateral: Secondary | ICD-10-CM | POA: Diagnosis not present

## 2024-07-28 ENCOUNTER — Other Ambulatory Visit: Payer: Self-pay

## 2024-07-28 ENCOUNTER — Emergency Department

## 2024-07-28 ENCOUNTER — Emergency Department
Admission: EM | Admit: 2024-07-28 | Discharge: 2024-07-29 | Disposition: A | Discharge status: Other Acute Inpatient Hospital | Attending: Emergency Medicine | Admitting: Emergency Medicine

## 2024-07-28 DIAGNOSIS — E039 Hypothyroidism, unspecified: Secondary | ICD-10-CM | POA: Diagnosis not present

## 2024-07-28 DIAGNOSIS — Y92512 Supermarket, store or market as the place of occurrence of the external cause: Secondary | ICD-10-CM | POA: Diagnosis not present

## 2024-07-28 DIAGNOSIS — N189 Chronic kidney disease, unspecified: Secondary | ICD-10-CM | POA: Diagnosis not present

## 2024-07-28 DIAGNOSIS — S42292A Other displaced fracture of upper end of left humerus, initial encounter for closed fracture: Secondary | ICD-10-CM

## 2024-07-28 DIAGNOSIS — M25519 Pain in unspecified shoulder: Secondary | ICD-10-CM | POA: Diagnosis not present

## 2024-07-28 DIAGNOSIS — M85812 Other specified disorders of bone density and structure, left shoulder: Secondary | ICD-10-CM | POA: Diagnosis not present

## 2024-07-28 DIAGNOSIS — S199XXA Unspecified injury of neck, initial encounter: Secondary | ICD-10-CM | POA: Diagnosis not present

## 2024-07-28 DIAGNOSIS — I129 Hypertensive chronic kidney disease with stage 1 through stage 4 chronic kidney disease, or unspecified chronic kidney disease: Secondary | ICD-10-CM | POA: Insufficient documentation

## 2024-07-28 DIAGNOSIS — W19XXXA Unspecified fall, initial encounter: Secondary | ICD-10-CM | POA: Diagnosis not present

## 2024-07-28 DIAGNOSIS — S20212A Contusion of left front wall of thorax, initial encounter: Secondary | ICD-10-CM | POA: Diagnosis not present

## 2024-07-28 DIAGNOSIS — W1809XA Striking against other object with subsequent fall, initial encounter: Secondary | ICD-10-CM | POA: Diagnosis not present

## 2024-07-28 DIAGNOSIS — I6782 Cerebral ischemia: Secondary | ICD-10-CM | POA: Diagnosis not present

## 2024-07-28 DIAGNOSIS — S42352A Displaced comminuted fracture of shaft of humerus, left arm, initial encounter for closed fracture: Secondary | ICD-10-CM | POA: Insufficient documentation

## 2024-07-28 DIAGNOSIS — S4992XA Unspecified injury of left shoulder and upper arm, initial encounter: Secondary | ICD-10-CM | POA: Diagnosis present

## 2024-07-28 DIAGNOSIS — R0781 Pleurodynia: Secondary | ICD-10-CM | POA: Diagnosis not present

## 2024-07-28 DIAGNOSIS — I771 Stricture of artery: Secondary | ICD-10-CM | POA: Diagnosis not present

## 2024-07-28 DIAGNOSIS — S0990XA Unspecified injury of head, initial encounter: Secondary | ICD-10-CM | POA: Diagnosis not present

## 2024-07-28 DIAGNOSIS — M47812 Spondylosis without myelopathy or radiculopathy, cervical region: Secondary | ICD-10-CM | POA: Diagnosis not present

## 2024-07-28 DIAGNOSIS — I700 Atherosclerosis of aorta: Secondary | ICD-10-CM | POA: Diagnosis not present

## 2024-07-28 MED ORDER — HYDROMORPHONE HCL 1 MG/ML IJ SOLN
0.5000 mg | Freq: Once | INTRAMUSCULAR | Status: AC
  Administered 2024-07-28: 0.5 mg via INTRAVENOUS
  Filled 2024-07-28: qty 0.5

## 2024-07-28 MED ORDER — HYDROMORPHONE HCL 1 MG/ML IJ SOLN
0.5000 mg | Freq: Once | INTRAMUSCULAR | Status: AC
  Administered 2024-07-28: 0.5 mg via INTRAMUSCULAR
  Filled 2024-07-28: qty 0.5

## 2024-07-28 NOTE — ED Notes (Signed)
Consent to transfer signed.  

## 2024-07-28 NOTE — ED Notes (Signed)
 Pt transported to xray

## 2024-07-28 NOTE — ED Provider Notes (Signed)
 [facility] Provider Note    Event Date/Time   First MD Initiated Contact with Patient 07/28/24 1851     (approximate)   History   Fall   HPI  Laura Bishop is a 83 y.o. female with a history of hypertension, hypothyroidism, CKD, hyperlipidemia, iron deficiency anemia and osteoarthritis who presents with left shoulder pain and head injury after a fall.  Per the patient and her husband, she was pushing a cart at [facility] when she lost her balance and fell forward, hitting her head and her left shoulder and left side.  She denies any hip or leg pain.  She did not lose consciousness.  She denies any neck or back pain.  I reviewed the past medical records.  The patient's most recent primary care encounter was on 3/21 for Medicare wellness visit.  She has no recent ED evaluations or admissions.   Physical Exam   Triage Vital Signs: ED Triage Vitals  Encounter Vitals Group     BP 07/28/24 1848 (!) 181/86     Girls Systolic BP Percentile --      Girls Diastolic BP Percentile --      Boys Systolic BP Percentile --      Boys Diastolic BP Percentile --      Pulse Rate 07/28/24 1848 88     Resp 07/28/24 1848 18     Temp 07/28/24 1848 98.2 F (36.8 C)     Temp Source 07/28/24 1848 Oral     SpO2 07/28/24 1848 97 %     Weight 07/28/24 1849 163 lb (73.9 kg)     Height 07/28/24 1849 5' (1.524 m)     Head Circumference --      Peak Flow --      Pain Score 07/28/24 1849 10     Pain Loc --      Pain Education --      Exclude from Growth Chart --     Most recent vital signs: Vitals:   07/28/24 2300 07/28/24 2314  BP: (!) 155/98   Pulse: 81   Resp: 20   Temp:  98.5 F (36.9 C)  SpO2: 99%      General: Awake, no distress.  CV:  Good peripheral perfusion.  Resp:  Normal effort.  Abd:  No distention.  Other:  Severe pain on ROM of left shoulder.  No deformity.  5/5 motor strength and intact sensation to median, ulnar, and radial distributions  distally.  No midline cervical spinal tenderness.  Motor intact in all extremities.  Left lateral chest wall tenderness with no step-off or crepitus.   ED Results / Procedures / Treatments   Labs (all labs ordered are listed, but only abnormal results are displayed) Labs Reviewed - No data to display   EKG     RADIOLOGY  XR L shoulder: I independently viewed and interpreted the images; there is a humeral neck fracture with significant displacement of the humeral head  XR L ribs/chest: No acute fracture  CT head: No ICH or other acute traumatic finding  CT cervical spine: No acute fracture  PROCEDURES:  Critical Care performed: No  Procedures   MEDICATIONS ORDERED IN ED: Medications  HYDROmorphone (DILAUDID) injection 0.5 mg (0.5 mg Intramuscular Given 07/28/24 1926)  HYDROmorphone (DILAUDID) injection 0.5 mg (0.5 mg Intravenous Given 07/28/24 2309)     IMPRESSION / MDM / ASSESSMENT AND PLAN / ED COURSE  I reviewed the triage vital signs and the nursing  notes.  83 year old female with PMH as noted above presents with left shoulder pain and a head injury after mechanical fall from standing height while pushing a cart.  Differential diagnosis includes, but is not limited to, humeral head fracture, other proximal humerus fracture, less likely shoulder dislocation, minor head injury, concussion, TBI.  Given the patient's age will obtain CT head and cervical spine as well as x-rays of the left shoulder and ribs.  Patient's presentation is most consistent with acute presentation with potential threat to life or bodily function.  ----------------------------------------- 11:28 PM on 07/28/2024 -----------------------------------------  CT head, cervical spine, and x-rays of the ribs are negative.  The shoulder x-ray shows a humeral anatomic neck fracture with significant displacement/dislocation of the humeral head.  I consulted and discussed the case with Dr. Cleotilde  from orthopedics.  He advises that this is a complex fracture requiring a likely shoulder replacement, and that neither he nor any of the local orthopedists on call for [facility] perform this surgery.  He recommends contacting [facility] or another tertiary center for potential transfer.  I then contacted [facility] and consulted Dr. Celena from orthopedics at [facility].  He advised that he also does not perform this surgery and would not feel comfortable accepting the patient as a transfer on behalf of another provider.  He recommended that we attempt a closed reduction in the ED, even though he advised that it would likely not be successful, as this would stabilize the fracture and allow additional time before the patient needed surgery.  He advised that if reduction was successful, the patient could potentially be admitted here at [facility], and that either his team or our orthopedist could try to find someone to do the surgery early next week.  However, I advised that I was not comfortable admitting the patient here if there is no specific disposition plan, and that if he could not perform the surgery I would proceed to contact a different tertiary center.  He agreed that this was reasonable.  I then contacted the [facility] transfer center.  I was advised that rather than going to orthopedics, because the patient had an acute fall, that she would be placed on the trauma pathway and I was put in touch with Dr. Blondell in the [facility] ED.  I consulted with Dr. Blondell who accepted the patient as an ED to ED transfer.  Since the patient has now been accepted for an immediate transfer and will be evaluated by orthopedics at [facility], I have determined that there is no significant benefit to attempting a closed reduction in the ED here.  Especially given the patient's age and comorbidities, moderate sedation in the ED carries significant risks.  Additionally, this procedure is known to be difficult, frequently unsuccessful, and with a high  rate of iatrogenic injury.  Therefore, there is no immediate benefit since the patient will be transferred to a tertiary center for further management.  I explained the plan to the patient and her husband, both of whom are in agreement.  The patient is stable for transfer at this time.   FINAL CLINICAL IMPRESSION(S) / ED DIAGNOSES   Final diagnoses:  Closed fracture of head of left humerus, initial encounter  Minor head injury, initial encounter  Contusion of left chest wall, initial encounter     Rx / DC Orders   ED Discharge Orders     None        Note:  This document was prepared using Dragon voice  recognition software and may include unintentional dictation errors.    Jacolyn Pae, MD 07/28/24 2337

## 2024-07-28 NOTE — ED Notes (Addendum)
 2235 @ [facility] & MD called back; call transferred to MD [facility]

## 2024-07-28 NOTE — ED Notes (Addendum)
 2236 @ spoke with [facility] Rep- Lavetta; Consult Ortho; provider paged Facesheet faxed/ imaging powershared

## 2024-07-28 NOTE — ED Notes (Signed)
@   2112 Spoke with Ginnie from [facility]-Consult Ortho; waiting for call back

## 2024-07-28 NOTE — ED Notes (Addendum)
 2237 @ [facility] MD-Brice called; transferred to MD-Siadecki. Pt accepted going ED to ED.  Call report [phone]

## 2024-07-28 NOTE — ED Notes (Signed)
 Pt's spouse called and updated that pt is going to be transferred to [facility] ED.

## 2024-07-28 NOTE — ED Triage Notes (Signed)
 Pt BIB [facility] from [facility] due to a mechanical fall. Pt states she was walking when her knees gave out. She states she fell and landed on her L shoulder and side. Denies hitting head or LOC. AXO. No blood thinners  195/126 94 PULSE

## 2024-07-28 NOTE — ED Notes (Signed)
Fall bundle in place

## 2024-07-29 DIAGNOSIS — S42252A Displaced fracture of greater tuberosity of left humerus, initial encounter for closed fracture: Secondary | ICD-10-CM | POA: Diagnosis not present

## 2024-07-29 DIAGNOSIS — E669 Obesity, unspecified: Secondary | ICD-10-CM | POA: Diagnosis not present

## 2024-07-29 DIAGNOSIS — M25522 Pain in left elbow: Secondary | ICD-10-CM | POA: Diagnosis not present

## 2024-07-29 DIAGNOSIS — I4892 Unspecified atrial flutter: Secondary | ICD-10-CM | POA: Diagnosis not present

## 2024-07-29 DIAGNOSIS — R918 Other nonspecific abnormal finding of lung field: Secondary | ICD-10-CM | POA: Diagnosis not present

## 2024-07-29 DIAGNOSIS — J9811 Atelectasis: Secondary | ICD-10-CM | POA: Diagnosis not present

## 2024-07-29 DIAGNOSIS — N189 Chronic kidney disease, unspecified: Secondary | ICD-10-CM | POA: Diagnosis not present

## 2024-07-29 DIAGNOSIS — R410 Disorientation, unspecified: Secondary | ICD-10-CM | POA: Diagnosis not present

## 2024-07-29 DIAGNOSIS — E039 Hypothyroidism, unspecified: Secondary | ICD-10-CM | POA: Diagnosis not present

## 2024-07-29 DIAGNOSIS — Z96612 Presence of left artificial shoulder joint: Secondary | ICD-10-CM | POA: Diagnosis not present

## 2024-07-29 DIAGNOSIS — Z8673 Personal history of transient ischemic attack (TIA), and cerebral infarction without residual deficits: Secondary | ICD-10-CM | POA: Diagnosis not present

## 2024-07-29 DIAGNOSIS — J90 Pleural effusion, not elsewhere classified: Secondary | ICD-10-CM | POA: Diagnosis not present

## 2024-07-29 DIAGNOSIS — I4891 Unspecified atrial fibrillation: Secondary | ICD-10-CM | POA: Diagnosis not present

## 2024-07-29 DIAGNOSIS — I129 Hypertensive chronic kidney disease with stage 1 through stage 4 chronic kidney disease, or unspecified chronic kidney disease: Secondary | ICD-10-CM | POA: Diagnosis not present

## 2024-07-29 DIAGNOSIS — Z743 Need for continuous supervision: Secondary | ICD-10-CM | POA: Diagnosis not present

## 2024-07-29 DIAGNOSIS — Z7982 Long term (current) use of aspirin: Secondary | ICD-10-CM | POA: Diagnosis not present

## 2024-07-29 DIAGNOSIS — S42222A 2-part displaced fracture of surgical neck of left humerus, initial encounter for closed fracture: Secondary | ICD-10-CM | POA: Diagnosis not present

## 2024-07-29 DIAGNOSIS — R4182 Altered mental status, unspecified: Secondary | ICD-10-CM | POA: Diagnosis not present

## 2024-07-29 DIAGNOSIS — W19XXXA Unspecified fall, initial encounter: Secondary | ICD-10-CM | POA: Diagnosis not present

## 2024-07-29 DIAGNOSIS — N183 Chronic kidney disease, stage 3 unspecified: Secondary | ICD-10-CM | POA: Diagnosis not present

## 2024-07-29 DIAGNOSIS — S42302A Unspecified fracture of shaft of humerus, left arm, initial encounter for closed fracture: Secondary | ICD-10-CM | POA: Diagnosis not present

## 2024-07-29 DIAGNOSIS — R55 Syncope and collapse: Secondary | ICD-10-CM | POA: Diagnosis not present

## 2024-07-29 NOTE — ED Notes (Signed)
 EMTALA reviewed by this RN.

## 2024-07-30 DIAGNOSIS — Z7982 Long term (current) use of aspirin: Secondary | ICD-10-CM | POA: Diagnosis not present

## 2024-07-30 DIAGNOSIS — I129 Hypertensive chronic kidney disease with stage 1 through stage 4 chronic kidney disease, or unspecified chronic kidney disease: Secondary | ICD-10-CM | POA: Diagnosis not present

## 2024-07-30 DIAGNOSIS — Z79899 Other long term (current) drug therapy: Secondary | ICD-10-CM | POA: Diagnosis not present

## 2024-07-30 DIAGNOSIS — W19XXXD Unspecified fall, subsequent encounter: Secondary | ICD-10-CM | POA: Diagnosis not present

## 2024-07-30 DIAGNOSIS — F05 Delirium due to known physiological condition: Secondary | ICD-10-CM | POA: Diagnosis not present

## 2024-07-30 DIAGNOSIS — M545 Low back pain, unspecified: Secondary | ICD-10-CM | POA: Diagnosis not present

## 2024-07-30 DIAGNOSIS — E039 Hypothyroidism, unspecified: Secondary | ICD-10-CM | POA: Diagnosis not present

## 2024-07-30 DIAGNOSIS — I4892 Unspecified atrial flutter: Secondary | ICD-10-CM | POA: Diagnosis not present

## 2024-07-30 DIAGNOSIS — M8580 Other specified disorders of bone density and structure, unspecified site: Secondary | ICD-10-CM | POA: Diagnosis not present

## 2024-07-30 DIAGNOSIS — S42202A Unspecified fracture of upper end of left humerus, initial encounter for closed fracture: Secondary | ICD-10-CM | POA: Diagnosis not present

## 2024-07-30 DIAGNOSIS — S42222D 2-part displaced fracture of surgical neck of left humerus, subsequent encounter for fracture with routine healing: Secondary | ICD-10-CM | POA: Diagnosis not present

## 2024-07-30 DIAGNOSIS — M810 Age-related osteoporosis without current pathological fracture: Secondary | ICD-10-CM | POA: Diagnosis not present

## 2024-07-30 DIAGNOSIS — J9811 Atelectasis: Secondary | ICD-10-CM | POA: Diagnosis not present

## 2024-07-30 DIAGNOSIS — I4891 Unspecified atrial fibrillation: Secondary | ICD-10-CM | POA: Diagnosis not present

## 2024-07-30 DIAGNOSIS — N189 Chronic kidney disease, unspecified: Secondary | ICD-10-CM | POA: Diagnosis not present

## 2024-07-30 DIAGNOSIS — I361 Nonrheumatic tricuspid (valve) insufficiency: Secondary | ICD-10-CM | POA: Diagnosis not present

## 2024-07-30 DIAGNOSIS — E785 Hyperlipidemia, unspecified: Secondary | ICD-10-CM | POA: Diagnosis not present

## 2024-07-30 DIAGNOSIS — D509 Iron deficiency anemia, unspecified: Secondary | ICD-10-CM | POA: Diagnosis not present

## 2024-07-30 DIAGNOSIS — Z90710 Acquired absence of both cervix and uterus: Secondary | ICD-10-CM | POA: Diagnosis not present

## 2024-07-30 DIAGNOSIS — S42212A Unspecified displaced fracture of surgical neck of left humerus, initial encounter for closed fracture: Secondary | ICD-10-CM | POA: Diagnosis not present

## 2024-07-30 DIAGNOSIS — S42292A Other displaced fracture of upper end of left humerus, initial encounter for closed fracture: Secondary | ICD-10-CM | POA: Diagnosis not present

## 2024-07-30 DIAGNOSIS — N183 Chronic kidney disease, stage 3 unspecified: Secondary | ICD-10-CM | POA: Diagnosis not present

## 2024-07-30 DIAGNOSIS — E559 Vitamin D deficiency, unspecified: Secondary | ICD-10-CM | POA: Diagnosis not present

## 2024-07-30 DIAGNOSIS — R42 Dizziness and giddiness: Secondary | ICD-10-CM | POA: Diagnosis not present

## 2024-07-30 DIAGNOSIS — E8809 Other disorders of plasma-protein metabolism, not elsewhere classified: Secondary | ICD-10-CM | POA: Diagnosis not present

## 2024-07-30 DIAGNOSIS — R55 Syncope and collapse: Secondary | ICD-10-CM | POA: Diagnosis not present

## 2024-07-30 DIAGNOSIS — I480 Paroxysmal atrial fibrillation: Secondary | ICD-10-CM | POA: Diagnosis not present

## 2024-07-30 DIAGNOSIS — Z8673 Personal history of transient ischemic attack (TIA), and cerebral infarction without residual deficits: Secondary | ICD-10-CM | POA: Diagnosis not present

## 2024-07-30 DIAGNOSIS — R600 Localized edema: Secondary | ICD-10-CM | POA: Diagnosis not present

## 2024-07-30 DIAGNOSIS — Z79890 Hormone replacement therapy (postmenopausal): Secondary | ICD-10-CM | POA: Diagnosis not present

## 2024-07-30 DIAGNOSIS — M25522 Pain in left elbow: Secondary | ICD-10-CM | POA: Diagnosis not present

## 2024-07-30 DIAGNOSIS — S42232A 3-part fracture of surgical neck of left humerus, initial encounter for closed fracture: Secondary | ICD-10-CM | POA: Diagnosis not present

## 2024-07-30 DIAGNOSIS — Z1152 Encounter for screening for COVID-19: Secondary | ICD-10-CM | POA: Diagnosis not present

## 2024-07-30 DIAGNOSIS — N3289 Other specified disorders of bladder: Secondary | ICD-10-CM | POA: Diagnosis not present

## 2024-07-30 DIAGNOSIS — Z7901 Long term (current) use of anticoagulants: Secondary | ICD-10-CM | POA: Diagnosis not present

## 2024-07-30 DIAGNOSIS — S42302A Unspecified fracture of shaft of humerus, left arm, initial encounter for closed fracture: Secondary | ICD-10-CM | POA: Diagnosis not present

## 2024-07-30 DIAGNOSIS — Z66 Do not resuscitate: Secondary | ICD-10-CM | POA: Diagnosis not present

## 2024-08-11 DIAGNOSIS — D509 Iron deficiency anemia, unspecified: Secondary | ICD-10-CM | POA: Diagnosis not present

## 2024-08-11 DIAGNOSIS — Z9181 History of falling: Secondary | ICD-10-CM | POA: Diagnosis not present

## 2024-08-11 DIAGNOSIS — D638 Anemia in other chronic diseases classified elsewhere: Secondary | ICD-10-CM | POA: Diagnosis not present

## 2024-08-11 DIAGNOSIS — F330 Major depressive disorder, recurrent, mild: Secondary | ICD-10-CM | POA: Diagnosis not present

## 2024-08-11 DIAGNOSIS — N179 Acute kidney failure, unspecified: Secondary | ICD-10-CM | POA: Diagnosis not present

## 2024-08-11 DIAGNOSIS — M25512 Pain in left shoulder: Secondary | ICD-10-CM | POA: Diagnosis not present

## 2024-08-11 DIAGNOSIS — I361 Nonrheumatic tricuspid (valve) insufficiency: Secondary | ICD-10-CM | POA: Diagnosis not present

## 2024-08-11 DIAGNOSIS — N3289 Other specified disorders of bladder: Secondary | ICD-10-CM | POA: Diagnosis not present

## 2024-08-11 DIAGNOSIS — R42 Dizziness and giddiness: Secondary | ICD-10-CM | POA: Diagnosis not present

## 2024-08-11 DIAGNOSIS — I4891 Unspecified atrial fibrillation: Secondary | ICD-10-CM | POA: Diagnosis not present

## 2024-08-11 DIAGNOSIS — F3341 Major depressive disorder, recurrent, in partial remission: Secondary | ICD-10-CM | POA: Diagnosis not present

## 2024-08-11 DIAGNOSIS — N3281 Overactive bladder: Secondary | ICD-10-CM | POA: Diagnosis not present

## 2024-08-11 DIAGNOSIS — E559 Vitamin D deficiency, unspecified: Secondary | ICD-10-CM | POA: Diagnosis not present

## 2024-08-11 DIAGNOSIS — N184 Chronic kidney disease, stage 4 (severe): Secondary | ICD-10-CM | POA: Diagnosis not present

## 2024-08-11 DIAGNOSIS — S42222D 2-part displaced fracture of surgical neck of left humerus, subsequent encounter for fracture with routine healing: Secondary | ICD-10-CM | POA: Diagnosis not present

## 2024-08-11 DIAGNOSIS — Z471 Aftercare following joint replacement surgery: Secondary | ICD-10-CM | POA: Diagnosis not present

## 2024-08-11 DIAGNOSIS — S42302A Unspecified fracture of shaft of humerus, left arm, initial encounter for closed fracture: Secondary | ICD-10-CM | POA: Diagnosis not present

## 2024-08-11 DIAGNOSIS — M545 Low back pain, unspecified: Secondary | ICD-10-CM | POA: Diagnosis not present

## 2024-08-11 DIAGNOSIS — E785 Hyperlipidemia, unspecified: Secondary | ICD-10-CM | POA: Diagnosis not present

## 2024-08-11 DIAGNOSIS — I129 Hypertensive chronic kidney disease with stage 1 through stage 4 chronic kidney disease, or unspecified chronic kidney disease: Secondary | ICD-10-CM | POA: Diagnosis not present

## 2024-08-11 DIAGNOSIS — M5440 Lumbago with sciatica, unspecified side: Secondary | ICD-10-CM | POA: Diagnosis not present

## 2024-08-11 DIAGNOSIS — E039 Hypothyroidism, unspecified: Secondary | ICD-10-CM | POA: Diagnosis not present

## 2024-08-11 DIAGNOSIS — N183 Chronic kidney disease, stage 3 unspecified: Secondary | ICD-10-CM | POA: Diagnosis not present

## 2024-08-11 DIAGNOSIS — R55 Syncope and collapse: Secondary | ICD-10-CM | POA: Diagnosis not present

## 2024-08-11 DIAGNOSIS — Z7982 Long term (current) use of aspirin: Secondary | ICD-10-CM | POA: Diagnosis not present

## 2024-08-11 DIAGNOSIS — Z8673 Personal history of transient ischemic attack (TIA), and cerebral infarction without residual deficits: Secondary | ICD-10-CM | POA: Diagnosis not present

## 2024-08-11 DIAGNOSIS — Z96612 Presence of left artificial shoulder joint: Secondary | ICD-10-CM | POA: Diagnosis not present

## 2024-08-11 DIAGNOSIS — E7800 Pure hypercholesterolemia, unspecified: Secondary | ICD-10-CM | POA: Diagnosis not present

## 2024-08-11 DIAGNOSIS — I1310 Hypertensive heart and chronic kidney disease without heart failure, with stage 1 through stage 4 chronic kidney disease, or unspecified chronic kidney disease: Secondary | ICD-10-CM | POA: Diagnosis not present

## 2024-08-11 DIAGNOSIS — I480 Paroxysmal atrial fibrillation: Secondary | ICD-10-CM | POA: Diagnosis not present

## 2024-08-11 DIAGNOSIS — M8580 Other specified disorders of bone density and structure, unspecified site: Secondary | ICD-10-CM | POA: Diagnosis not present

## 2024-08-11 DIAGNOSIS — W19XXXD Unspecified fall, subsequent encounter: Secondary | ICD-10-CM | POA: Diagnosis not present

## 2024-08-11 DIAGNOSIS — K5900 Constipation, unspecified: Secondary | ICD-10-CM | POA: Diagnosis not present

## 2024-08-11 DIAGNOSIS — Z7901 Long term (current) use of anticoagulants: Secondary | ICD-10-CM | POA: Diagnosis not present

## 2024-08-11 DIAGNOSIS — R634 Abnormal weight loss: Secondary | ICD-10-CM | POA: Diagnosis not present

## 2024-08-14 DIAGNOSIS — S42222D 2-part displaced fracture of surgical neck of left humerus, subsequent encounter for fracture with routine healing: Secondary | ICD-10-CM | POA: Diagnosis not present

## 2024-08-14 DIAGNOSIS — M545 Low back pain, unspecified: Secondary | ICD-10-CM | POA: Diagnosis not present

## 2024-08-14 DIAGNOSIS — Z96612 Presence of left artificial shoulder joint: Secondary | ICD-10-CM | POA: Diagnosis not present

## 2024-08-14 DIAGNOSIS — D509 Iron deficiency anemia, unspecified: Secondary | ICD-10-CM | POA: Diagnosis not present

## 2024-08-14 DIAGNOSIS — Z9181 History of falling: Secondary | ICD-10-CM | POA: Diagnosis not present

## 2024-08-14 DIAGNOSIS — N183 Chronic kidney disease, stage 3 unspecified: Secondary | ICD-10-CM | POA: Diagnosis not present

## 2024-08-14 DIAGNOSIS — Z7901 Long term (current) use of anticoagulants: Secondary | ICD-10-CM | POA: Diagnosis not present

## 2024-08-14 DIAGNOSIS — I480 Paroxysmal atrial fibrillation: Secondary | ICD-10-CM | POA: Diagnosis not present

## 2024-08-14 DIAGNOSIS — Z471 Aftercare following joint replacement surgery: Secondary | ICD-10-CM | POA: Diagnosis not present

## 2024-08-14 DIAGNOSIS — E039 Hypothyroidism, unspecified: Secondary | ICD-10-CM | POA: Diagnosis not present

## 2024-08-14 DIAGNOSIS — E7800 Pure hypercholesterolemia, unspecified: Secondary | ICD-10-CM | POA: Diagnosis not present

## 2024-08-14 DIAGNOSIS — M25512 Pain in left shoulder: Secondary | ICD-10-CM | POA: Diagnosis not present

## 2024-08-15 DIAGNOSIS — S42302A Unspecified fracture of shaft of humerus, left arm, initial encounter for closed fracture: Secondary | ICD-10-CM | POA: Diagnosis not present

## 2024-08-15 DIAGNOSIS — I480 Paroxysmal atrial fibrillation: Secondary | ICD-10-CM | POA: Diagnosis not present

## 2024-08-15 DIAGNOSIS — M5440 Lumbago with sciatica, unspecified side: Secondary | ICD-10-CM | POA: Diagnosis not present

## 2024-08-15 DIAGNOSIS — E039 Hypothyroidism, unspecified: Secondary | ICD-10-CM | POA: Diagnosis not present

## 2024-08-15 DIAGNOSIS — D638 Anemia in other chronic diseases classified elsewhere: Secondary | ICD-10-CM | POA: Diagnosis not present

## 2024-08-16 DIAGNOSIS — M25512 Pain in left shoulder: Secondary | ICD-10-CM | POA: Diagnosis not present

## 2024-08-16 DIAGNOSIS — I480 Paroxysmal atrial fibrillation: Secondary | ICD-10-CM | POA: Diagnosis not present

## 2024-08-16 DIAGNOSIS — Z96612 Presence of left artificial shoulder joint: Secondary | ICD-10-CM | POA: Diagnosis not present

## 2024-08-16 DIAGNOSIS — N179 Acute kidney failure, unspecified: Secondary | ICD-10-CM | POA: Diagnosis not present

## 2024-08-16 DIAGNOSIS — K5900 Constipation, unspecified: Secondary | ICD-10-CM | POA: Diagnosis not present

## 2024-08-16 DIAGNOSIS — D509 Iron deficiency anemia, unspecified: Secondary | ICD-10-CM | POA: Diagnosis not present

## 2024-08-16 DIAGNOSIS — I129 Hypertensive chronic kidney disease with stage 1 through stage 4 chronic kidney disease, or unspecified chronic kidney disease: Secondary | ICD-10-CM | POA: Diagnosis not present

## 2024-08-16 DIAGNOSIS — E039 Hypothyroidism, unspecified: Secondary | ICD-10-CM | POA: Diagnosis not present

## 2024-08-16 DIAGNOSIS — N184 Chronic kidney disease, stage 4 (severe): Secondary | ICD-10-CM | POA: Diagnosis not present

## 2024-08-16 DIAGNOSIS — Z7901 Long term (current) use of anticoagulants: Secondary | ICD-10-CM | POA: Diagnosis not present

## 2024-08-16 DIAGNOSIS — Z9181 History of falling: Secondary | ICD-10-CM | POA: Diagnosis not present

## 2024-08-16 DIAGNOSIS — Z471 Aftercare following joint replacement surgery: Secondary | ICD-10-CM | POA: Diagnosis not present

## 2024-08-17 DIAGNOSIS — Z96612 Presence of left artificial shoulder joint: Secondary | ICD-10-CM | POA: Diagnosis not present

## 2024-08-17 DIAGNOSIS — Z471 Aftercare following joint replacement surgery: Secondary | ICD-10-CM | POA: Diagnosis not present

## 2024-08-18 DIAGNOSIS — M25512 Pain in left shoulder: Secondary | ICD-10-CM | POA: Diagnosis not present

## 2024-08-18 DIAGNOSIS — Z96612 Presence of left artificial shoulder joint: Secondary | ICD-10-CM | POA: Diagnosis not present

## 2024-08-18 DIAGNOSIS — K5900 Constipation, unspecified: Secondary | ICD-10-CM | POA: Diagnosis not present

## 2024-08-18 DIAGNOSIS — D509 Iron deficiency anemia, unspecified: Secondary | ICD-10-CM | POA: Diagnosis not present

## 2024-08-18 DIAGNOSIS — N184 Chronic kidney disease, stage 4 (severe): Secondary | ICD-10-CM | POA: Diagnosis not present

## 2024-08-18 DIAGNOSIS — I480 Paroxysmal atrial fibrillation: Secondary | ICD-10-CM | POA: Diagnosis not present

## 2024-08-18 DIAGNOSIS — Z471 Aftercare following joint replacement surgery: Secondary | ICD-10-CM | POA: Diagnosis not present

## 2024-08-18 DIAGNOSIS — E039 Hypothyroidism, unspecified: Secondary | ICD-10-CM | POA: Diagnosis not present

## 2024-08-18 DIAGNOSIS — Z7901 Long term (current) use of anticoagulants: Secondary | ICD-10-CM | POA: Diagnosis not present

## 2024-08-18 DIAGNOSIS — Z9181 History of falling: Secondary | ICD-10-CM | POA: Diagnosis not present

## 2024-08-18 DIAGNOSIS — I129 Hypertensive chronic kidney disease with stage 1 through stage 4 chronic kidney disease, or unspecified chronic kidney disease: Secondary | ICD-10-CM | POA: Diagnosis not present

## 2024-08-18 DIAGNOSIS — N179 Acute kidney failure, unspecified: Secondary | ICD-10-CM | POA: Diagnosis not present

## 2024-08-21 DIAGNOSIS — Z96612 Presence of left artificial shoulder joint: Secondary | ICD-10-CM | POA: Diagnosis not present

## 2024-08-21 DIAGNOSIS — M25512 Pain in left shoulder: Secondary | ICD-10-CM | POA: Diagnosis not present

## 2024-08-21 DIAGNOSIS — Z7901 Long term (current) use of anticoagulants: Secondary | ICD-10-CM | POA: Diagnosis not present

## 2024-08-21 DIAGNOSIS — I480 Paroxysmal atrial fibrillation: Secondary | ICD-10-CM | POA: Diagnosis not present

## 2024-08-21 DIAGNOSIS — I129 Hypertensive chronic kidney disease with stage 1 through stage 4 chronic kidney disease, or unspecified chronic kidney disease: Secondary | ICD-10-CM | POA: Diagnosis not present

## 2024-08-21 DIAGNOSIS — N3281 Overactive bladder: Secondary | ICD-10-CM | POA: Diagnosis not present

## 2024-08-21 DIAGNOSIS — E039 Hypothyroidism, unspecified: Secondary | ICD-10-CM | POA: Diagnosis not present

## 2024-08-21 DIAGNOSIS — N179 Acute kidney failure, unspecified: Secondary | ICD-10-CM | POA: Diagnosis not present

## 2024-08-21 DIAGNOSIS — N184 Chronic kidney disease, stage 4 (severe): Secondary | ICD-10-CM | POA: Diagnosis not present

## 2024-08-21 DIAGNOSIS — Z9181 History of falling: Secondary | ICD-10-CM | POA: Diagnosis not present

## 2024-08-21 DIAGNOSIS — D509 Iron deficiency anemia, unspecified: Secondary | ICD-10-CM | POA: Diagnosis not present

## 2024-08-21 DIAGNOSIS — Z471 Aftercare following joint replacement surgery: Secondary | ICD-10-CM | POA: Diagnosis not present

## 2024-08-23 DIAGNOSIS — D509 Iron deficiency anemia, unspecified: Secondary | ICD-10-CM | POA: Diagnosis not present

## 2024-08-23 DIAGNOSIS — I480 Paroxysmal atrial fibrillation: Secondary | ICD-10-CM | POA: Diagnosis not present

## 2024-08-23 DIAGNOSIS — E039 Hypothyroidism, unspecified: Secondary | ICD-10-CM | POA: Diagnosis not present

## 2024-08-23 DIAGNOSIS — N179 Acute kidney failure, unspecified: Secondary | ICD-10-CM | POA: Diagnosis not present

## 2024-08-23 DIAGNOSIS — N184 Chronic kidney disease, stage 4 (severe): Secondary | ICD-10-CM | POA: Diagnosis not present

## 2024-08-23 DIAGNOSIS — Z9181 History of falling: Secondary | ICD-10-CM | POA: Diagnosis not present

## 2024-08-23 DIAGNOSIS — Z96612 Presence of left artificial shoulder joint: Secondary | ICD-10-CM | POA: Diagnosis not present

## 2024-08-23 DIAGNOSIS — Z7901 Long term (current) use of anticoagulants: Secondary | ICD-10-CM | POA: Diagnosis not present

## 2024-08-23 DIAGNOSIS — I1310 Hypertensive heart and chronic kidney disease without heart failure, with stage 1 through stage 4 chronic kidney disease, or unspecified chronic kidney disease: Secondary | ICD-10-CM | POA: Diagnosis not present

## 2024-08-23 DIAGNOSIS — Z471 Aftercare following joint replacement surgery: Secondary | ICD-10-CM | POA: Diagnosis not present

## 2024-08-25 DIAGNOSIS — I1310 Hypertensive heart and chronic kidney disease without heart failure, with stage 1 through stage 4 chronic kidney disease, or unspecified chronic kidney disease: Secondary | ICD-10-CM | POA: Diagnosis not present

## 2024-08-25 DIAGNOSIS — Z7901 Long term (current) use of anticoagulants: Secondary | ICD-10-CM | POA: Diagnosis not present

## 2024-08-25 DIAGNOSIS — E039 Hypothyroidism, unspecified: Secondary | ICD-10-CM | POA: Diagnosis not present

## 2024-08-25 DIAGNOSIS — Z471 Aftercare following joint replacement surgery: Secondary | ICD-10-CM | POA: Diagnosis not present

## 2024-08-25 DIAGNOSIS — N184 Chronic kidney disease, stage 4 (severe): Secondary | ICD-10-CM | POA: Diagnosis not present

## 2024-08-25 DIAGNOSIS — I480 Paroxysmal atrial fibrillation: Secondary | ICD-10-CM | POA: Diagnosis not present

## 2024-08-25 DIAGNOSIS — Z9181 History of falling: Secondary | ICD-10-CM | POA: Diagnosis not present

## 2024-08-25 DIAGNOSIS — D509 Iron deficiency anemia, unspecified: Secondary | ICD-10-CM | POA: Diagnosis not present

## 2024-08-25 DIAGNOSIS — Z96612 Presence of left artificial shoulder joint: Secondary | ICD-10-CM | POA: Diagnosis not present

## 2024-08-25 DIAGNOSIS — N3281 Overactive bladder: Secondary | ICD-10-CM | POA: Diagnosis not present

## 2024-08-25 DIAGNOSIS — N179 Acute kidney failure, unspecified: Secondary | ICD-10-CM | POA: Diagnosis not present

## 2024-08-28 DIAGNOSIS — N184 Chronic kidney disease, stage 4 (severe): Secondary | ICD-10-CM | POA: Diagnosis not present

## 2024-08-28 DIAGNOSIS — Z7901 Long term (current) use of anticoagulants: Secondary | ICD-10-CM | POA: Diagnosis not present

## 2024-08-28 DIAGNOSIS — I1310 Hypertensive heart and chronic kidney disease without heart failure, with stage 1 through stage 4 chronic kidney disease, or unspecified chronic kidney disease: Secondary | ICD-10-CM | POA: Diagnosis not present

## 2024-08-28 DIAGNOSIS — D509 Iron deficiency anemia, unspecified: Secondary | ICD-10-CM | POA: Diagnosis not present

## 2024-08-28 DIAGNOSIS — M545 Low back pain, unspecified: Secondary | ICD-10-CM | POA: Diagnosis not present

## 2024-08-28 DIAGNOSIS — K5900 Constipation, unspecified: Secondary | ICD-10-CM | POA: Diagnosis not present

## 2024-08-28 DIAGNOSIS — Z96612 Presence of left artificial shoulder joint: Secondary | ICD-10-CM | POA: Diagnosis not present

## 2024-08-28 DIAGNOSIS — E039 Hypothyroidism, unspecified: Secondary | ICD-10-CM | POA: Diagnosis not present

## 2024-08-28 DIAGNOSIS — Z471 Aftercare following joint replacement surgery: Secondary | ICD-10-CM | POA: Diagnosis not present

## 2024-08-28 DIAGNOSIS — N179 Acute kidney failure, unspecified: Secondary | ICD-10-CM | POA: Diagnosis not present

## 2024-08-28 DIAGNOSIS — Z9181 History of falling: Secondary | ICD-10-CM | POA: Diagnosis not present

## 2024-08-28 DIAGNOSIS — I480 Paroxysmal atrial fibrillation: Secondary | ICD-10-CM | POA: Diagnosis not present

## 2024-08-29 DIAGNOSIS — N184 Chronic kidney disease, stage 4 (severe): Secondary | ICD-10-CM | POA: Diagnosis not present

## 2024-08-29 DIAGNOSIS — F3341 Major depressive disorder, recurrent, in partial remission: Secondary | ICD-10-CM | POA: Diagnosis not present

## 2024-08-29 DIAGNOSIS — R634 Abnormal weight loss: Secondary | ICD-10-CM | POA: Diagnosis not present

## 2024-09-01 DIAGNOSIS — R42 Dizziness and giddiness: Secondary | ICD-10-CM | POA: Diagnosis not present

## 2024-09-01 DIAGNOSIS — M8580 Other specified disorders of bone density and structure, unspecified site: Secondary | ICD-10-CM | POA: Diagnosis not present

## 2024-09-01 DIAGNOSIS — I361 Nonrheumatic tricuspid (valve) insufficiency: Secondary | ICD-10-CM | POA: Diagnosis not present

## 2024-09-01 DIAGNOSIS — R55 Syncope and collapse: Secondary | ICD-10-CM | POA: Diagnosis not present

## 2024-09-01 DIAGNOSIS — S42222D 2-part displaced fracture of surgical neck of left humerus, subsequent encounter for fracture with routine healing: Secondary | ICD-10-CM | POA: Diagnosis not present

## 2024-09-01 DIAGNOSIS — N3289 Other specified disorders of bladder: Secondary | ICD-10-CM | POA: Diagnosis not present

## 2024-09-01 DIAGNOSIS — I129 Hypertensive chronic kidney disease with stage 1 through stage 4 chronic kidney disease, or unspecified chronic kidney disease: Secondary | ICD-10-CM | POA: Diagnosis not present

## 2024-09-01 DIAGNOSIS — I4891 Unspecified atrial fibrillation: Secondary | ICD-10-CM | POA: Diagnosis not present

## 2024-09-01 DIAGNOSIS — M545 Low back pain, unspecified: Secondary | ICD-10-CM | POA: Diagnosis not present

## 2024-09-01 DIAGNOSIS — N183 Chronic kidney disease, stage 3 unspecified: Secondary | ICD-10-CM | POA: Diagnosis not present

## 2024-09-04 DIAGNOSIS — R8290 Unspecified abnormal findings in urine: Secondary | ICD-10-CM | POA: Diagnosis not present

## 2024-09-04 DIAGNOSIS — I129 Hypertensive chronic kidney disease with stage 1 through stage 4 chronic kidney disease, or unspecified chronic kidney disease: Secondary | ICD-10-CM | POA: Diagnosis not present

## 2024-09-04 DIAGNOSIS — D500 Iron deficiency anemia secondary to blood loss (chronic): Secondary | ICD-10-CM | POA: Diagnosis not present

## 2024-09-04 DIAGNOSIS — N3281 Overactive bladder: Secondary | ICD-10-CM | POA: Diagnosis not present

## 2024-09-04 DIAGNOSIS — L89152 Pressure ulcer of sacral region, stage 2: Secondary | ICD-10-CM | POA: Diagnosis not present

## 2024-09-04 DIAGNOSIS — S42412A Displaced simple supracondylar fracture without intercondylar fracture of left humerus, initial encounter for closed fracture: Secondary | ICD-10-CM | POA: Diagnosis not present

## 2024-09-04 DIAGNOSIS — N184 Chronic kidney disease, stage 4 (severe): Secondary | ICD-10-CM | POA: Diagnosis not present

## 2024-09-04 DIAGNOSIS — I10 Essential (primary) hypertension: Secondary | ICD-10-CM | POA: Diagnosis not present

## 2024-09-04 DIAGNOSIS — Z09 Encounter for follow-up examination after completed treatment for conditions other than malignant neoplasm: Secondary | ICD-10-CM | POA: Diagnosis not present

## 2024-09-04 DIAGNOSIS — E782 Mixed hyperlipidemia: Secondary | ICD-10-CM | POA: Diagnosis not present

## 2024-09-11 ENCOUNTER — Other Ambulatory Visit: Payer: Self-pay | Admitting: Nephrology

## 2024-09-11 DIAGNOSIS — N179 Acute kidney failure, unspecified: Secondary | ICD-10-CM | POA: Diagnosis not present

## 2024-09-11 DIAGNOSIS — I10 Essential (primary) hypertension: Secondary | ICD-10-CM | POA: Diagnosis not present

## 2024-09-11 DIAGNOSIS — N1832 Chronic kidney disease, stage 3b: Secondary | ICD-10-CM

## 2024-09-14 DIAGNOSIS — M19012 Primary osteoarthritis, left shoulder: Secondary | ICD-10-CM | POA: Diagnosis not present

## 2024-09-14 DIAGNOSIS — Z471 Aftercare following joint replacement surgery: Secondary | ICD-10-CM | POA: Diagnosis not present

## 2024-09-14 DIAGNOSIS — Z96612 Presence of left artificial shoulder joint: Secondary | ICD-10-CM | POA: Diagnosis not present

## 2024-09-15 ENCOUNTER — Ambulatory Visit: Admission: RE | Admit: 2024-09-15 | Discharge: 2024-09-15 | Attending: Nephrology | Admitting: Nephrology

## 2024-09-15 DIAGNOSIS — N179 Acute kidney failure, unspecified: Secondary | ICD-10-CM | POA: Diagnosis not present

## 2024-09-15 DIAGNOSIS — N261 Atrophy of kidney (terminal): Secondary | ICD-10-CM | POA: Diagnosis not present

## 2024-09-15 DIAGNOSIS — N1832 Chronic kidney disease, stage 3b: Secondary | ICD-10-CM

## 2024-09-15 DIAGNOSIS — I10 Essential (primary) hypertension: Secondary | ICD-10-CM | POA: Diagnosis not present

## 2024-09-15 DIAGNOSIS — N281 Cyst of kidney, acquired: Secondary | ICD-10-CM | POA: Diagnosis not present

## 2024-09-15 DIAGNOSIS — N189 Chronic kidney disease, unspecified: Secondary | ICD-10-CM | POA: Diagnosis not present

## 2024-09-29 ENCOUNTER — Emergency Department

## 2024-09-29 ENCOUNTER — Inpatient Hospital Stay
Admission: EM | Admit: 2024-09-29 | Discharge: 2024-10-08 | DRG: 689 | Disposition: A | Discharge status: Home Health | Attending: Internal Medicine | Admitting: Internal Medicine

## 2024-09-29 ENCOUNTER — Other Ambulatory Visit: Payer: Self-pay

## 2024-09-29 DIAGNOSIS — Z90710 Acquired absence of both cervix and uterus: Secondary | ICD-10-CM

## 2024-09-29 DIAGNOSIS — L89322 Pressure ulcer of left buttock, stage 2: Secondary | ICD-10-CM | POA: Diagnosis present

## 2024-09-29 DIAGNOSIS — W19XXXA Unspecified fall, initial encounter: Secondary | ICD-10-CM | POA: Diagnosis not present

## 2024-09-29 DIAGNOSIS — I129 Hypertensive chronic kidney disease with stage 1 through stage 4 chronic kidney disease, or unspecified chronic kidney disease: Secondary | ICD-10-CM | POA: Diagnosis present

## 2024-09-29 DIAGNOSIS — R7989 Other specified abnormal findings of blood chemistry: Secondary | ICD-10-CM | POA: Diagnosis not present

## 2024-09-29 DIAGNOSIS — R000 Tachycardia, unspecified: Secondary | ICD-10-CM | POA: Diagnosis not present

## 2024-09-29 DIAGNOSIS — F05 Delirium due to known physiological condition: Secondary | ICD-10-CM | POA: Diagnosis present

## 2024-09-29 DIAGNOSIS — I480 Paroxysmal atrial fibrillation: Secondary | ICD-10-CM | POA: Diagnosis present

## 2024-09-29 DIAGNOSIS — L89311 Pressure ulcer of right buttock, stage 1: Secondary | ICD-10-CM | POA: Diagnosis present

## 2024-09-29 DIAGNOSIS — E870 Hyperosmolality and hypernatremia: Secondary | ICD-10-CM | POA: Diagnosis not present

## 2024-09-29 DIAGNOSIS — E876 Hypokalemia: Secondary | ICD-10-CM | POA: Diagnosis present

## 2024-09-29 DIAGNOSIS — W19XXXD Unspecified fall, subsequent encounter: Secondary | ICD-10-CM | POA: Diagnosis not present

## 2024-09-29 DIAGNOSIS — R5383 Other fatigue: Secondary | ICD-10-CM

## 2024-09-29 DIAGNOSIS — Z7982 Long term (current) use of aspirin: Secondary | ICD-10-CM

## 2024-09-29 DIAGNOSIS — N3000 Acute cystitis without hematuria: Secondary | ICD-10-CM | POA: Diagnosis not present

## 2024-09-29 DIAGNOSIS — Z515 Encounter for palliative care: Secondary | ICD-10-CM | POA: Diagnosis not present

## 2024-09-29 DIAGNOSIS — M810 Age-related osteoporosis without current pathological fracture: Secondary | ICD-10-CM | POA: Diagnosis present

## 2024-09-29 DIAGNOSIS — Z7901 Long term (current) use of anticoagulants: Secondary | ICD-10-CM | POA: Diagnosis not present

## 2024-09-29 DIAGNOSIS — N179 Acute kidney failure, unspecified: Secondary | ICD-10-CM | POA: Diagnosis present

## 2024-09-29 DIAGNOSIS — G9341 Metabolic encephalopathy: Secondary | ICD-10-CM | POA: Diagnosis present

## 2024-09-29 DIAGNOSIS — I2489 Other forms of acute ischemic heart disease: Secondary | ICD-10-CM | POA: Diagnosis present

## 2024-09-29 DIAGNOSIS — F03918 Unspecified dementia, unspecified severity, with other behavioral disturbance: Secondary | ICD-10-CM | POA: Diagnosis present

## 2024-09-29 DIAGNOSIS — Z66 Do not resuscitate: Secondary | ICD-10-CM | POA: Diagnosis present

## 2024-09-29 DIAGNOSIS — Z7189 Other specified counseling: Secondary | ICD-10-CM | POA: Diagnosis not present

## 2024-09-29 DIAGNOSIS — N1832 Chronic kidney disease, stage 3b: Secondary | ICD-10-CM | POA: Diagnosis not present

## 2024-09-29 DIAGNOSIS — M170 Bilateral primary osteoarthritis of knee: Secondary | ICD-10-CM | POA: Diagnosis present

## 2024-09-29 DIAGNOSIS — E039 Hypothyroidism, unspecified: Secondary | ICD-10-CM | POA: Diagnosis present

## 2024-09-29 DIAGNOSIS — Z801 Family history of malignant neoplasm of trachea, bronchus and lung: Secondary | ICD-10-CM

## 2024-09-29 DIAGNOSIS — B965 Pseudomonas (aeruginosa) (mallei) (pseudomallei) as the cause of diseases classified elsewhere: Secondary | ICD-10-CM | POA: Diagnosis present

## 2024-09-29 DIAGNOSIS — N184 Chronic kidney disease, stage 4 (severe): Secondary | ICD-10-CM | POA: Diagnosis present

## 2024-09-29 DIAGNOSIS — E860 Dehydration: Secondary | ICD-10-CM | POA: Diagnosis present

## 2024-09-29 DIAGNOSIS — F0394 Unspecified dementia, unspecified severity, with anxiety: Secondary | ICD-10-CM | POA: Diagnosis present

## 2024-09-29 DIAGNOSIS — E872 Acidosis, unspecified: Secondary | ICD-10-CM | POA: Diagnosis present

## 2024-09-29 DIAGNOSIS — N390 Urinary tract infection, site not specified: Principal | ICD-10-CM | POA: Diagnosis present

## 2024-09-29 DIAGNOSIS — R296 Repeated falls: Secondary | ICD-10-CM | POA: Diagnosis present

## 2024-09-29 DIAGNOSIS — Z1152 Encounter for screening for COVID-19: Secondary | ICD-10-CM | POA: Diagnosis not present

## 2024-09-29 DIAGNOSIS — R5381 Other malaise: Secondary | ICD-10-CM

## 2024-09-29 DIAGNOSIS — R630 Anorexia: Secondary | ICD-10-CM | POA: Diagnosis not present

## 2024-09-29 DIAGNOSIS — R001 Bradycardia, unspecified: Secondary | ICD-10-CM | POA: Diagnosis not present

## 2024-09-29 DIAGNOSIS — D649 Anemia, unspecified: Secondary | ICD-10-CM | POA: Diagnosis present

## 2024-09-29 DIAGNOSIS — Z800 Family history of malignant neoplasm of digestive organs: Secondary | ICD-10-CM

## 2024-09-29 DIAGNOSIS — R627 Adult failure to thrive: Secondary | ICD-10-CM | POA: Diagnosis not present

## 2024-09-29 DIAGNOSIS — N3281 Overactive bladder: Secondary | ICD-10-CM | POA: Diagnosis present

## 2024-09-29 DIAGNOSIS — Z79899 Other long term (current) drug therapy: Secondary | ICD-10-CM

## 2024-09-29 DIAGNOSIS — Z789 Other specified health status: Secondary | ICD-10-CM | POA: Diagnosis not present

## 2024-09-29 DIAGNOSIS — I4891 Unspecified atrial fibrillation: Secondary | ICD-10-CM | POA: Diagnosis not present

## 2024-09-29 DIAGNOSIS — Z8673 Personal history of transient ischemic attack (TIA), and cerebral infarction without residual deficits: Secondary | ICD-10-CM

## 2024-09-29 DIAGNOSIS — Z79890 Hormone replacement therapy (postmenopausal): Secondary | ICD-10-CM

## 2024-09-29 DIAGNOSIS — E782 Mixed hyperlipidemia: Secondary | ICD-10-CM | POA: Diagnosis present

## 2024-09-29 DIAGNOSIS — R531 Weakness: Secondary | ICD-10-CM | POA: Diagnosis not present

## 2024-09-29 DIAGNOSIS — L8990 Pressure ulcer of unspecified site, unspecified stage: Secondary | ICD-10-CM | POA: Insufficient documentation

## 2024-09-29 LAB — COMPREHENSIVE METABOLIC PANEL WITH GFR
ALT: 12 U/L (ref 0–44)
AST: 17 U/L (ref 15–41)
Albumin: 3.5 g/dL (ref 3.5–5.0)
Alkaline Phosphatase: 49 U/L (ref 38–126)
Anion gap: 15 (ref 5–15)
BUN: 30 mg/dL — ABNORMAL HIGH (ref 8–23)
CO2: 20 mmol/L — ABNORMAL LOW (ref 22–32)
Calcium: 11.9 mg/dL — ABNORMAL HIGH (ref 8.9–10.3)
Chloride: 105 mmol/L (ref 98–111)
Creatinine, Ser: 1.93 mg/dL — ABNORMAL HIGH (ref 0.44–1.00)
GFR, Estimated: 25 mL/min — ABNORMAL LOW
Glucose, Bld: 76 mg/dL (ref 70–99)
Potassium: 3.5 mmol/L (ref 3.5–5.1)
Sodium: 140 mmol/L (ref 135–145)
Total Bilirubin: 0.2 mg/dL (ref 0.0–1.2)
Total Protein: 6 g/dL — ABNORMAL LOW (ref 6.5–8.1)

## 2024-09-29 LAB — URINALYSIS, ROUTINE W REFLEX MICROSCOPIC
Bilirubin Urine: NEGATIVE
Glucose, UA: NEGATIVE mg/dL
Hgb urine dipstick: NEGATIVE
Ketones, ur: NEGATIVE mg/dL
Nitrite: NEGATIVE
Protein, ur: 30 mg/dL — AB
Specific Gravity, Urine: 1.025 (ref 1.005–1.030)
WBC, UA: >50 WBC/hpf (ref 0–5)
pH: 5 (ref 5.0–8.0)

## 2024-09-29 LAB — RESP PANEL BY RT-PCR (RSV, FLU A&B, COVID)  RVPGX2
Influenza A by PCR: NEGATIVE
Influenza B by PCR: NEGATIVE
Resp Syncytial Virus by PCR: NEGATIVE
SARS Coronavirus 2 by RT PCR: NEGATIVE

## 2024-09-29 LAB — CBG MONITORING, ED
Glucose-Capillary: 77 mg/dL (ref 70–99)
Glucose-Capillary: 81 mg/dL (ref 70–99)

## 2024-09-29 LAB — CBC
HCT: 31.3 % — ABNORMAL LOW (ref 36.0–46.0)
Hemoglobin: 9.8 g/dL — ABNORMAL LOW (ref 12.0–15.0)
MCH: 30 pg (ref 26.0–34.0)
MCHC: 31.3 g/dL (ref 30.0–36.0)
MCV: 95.7 fL (ref 80.0–100.0)
Platelets: 348 K/uL (ref 150–400)
RBC: 3.27 MIL/uL — ABNORMAL LOW (ref 3.87–5.11)
RDW: 16.1 % — ABNORMAL HIGH (ref 11.5–15.5)
WBC: 7.6 K/uL (ref 4.0–10.5)
nRBC: 0 % (ref 0.0–0.2)

## 2024-09-29 LAB — PRO BRAIN NATRIURETIC PEPTIDE: Pro Brain Natriuretic Peptide: 579 pg/mL — ABNORMAL HIGH

## 2024-09-29 LAB — TROPONIN T, HIGH SENSITIVITY
Troponin T High Sensitivity: 56 ng/L — ABNORMAL HIGH (ref 0–19)
Troponin T High Sensitivity: 52 ng/L — ABNORMAL HIGH (ref 0–19)

## 2024-09-29 MED ORDER — DILTIAZEM HCL 25 MG/5ML IV SOLN
5.0000 mg | Freq: Once | INTRAVENOUS | Status: AC
  Administered 2024-09-29: 5 mg via INTRAVENOUS
  Filled 2024-09-29: qty 5

## 2024-09-29 MED ORDER — SODIUM CHLORIDE 0.9 % IV SOLN
2.0000 g | Freq: Once | INTRAVENOUS | Status: AC
  Administered 2024-09-29: 2 g via INTRAVENOUS
  Filled 2024-09-29: qty 20

## 2024-09-29 MED ORDER — SODIUM CHLORIDE 0.9 % IV BOLUS
500.0000 mL | Freq: Once | INTRAVENOUS | Status: AC
  Administered 2024-09-29: 500 mL via INTRAVENOUS

## 2024-09-29 NOTE — ED Triage Notes (Signed)
 Pt arrives via [facility] from home after the home health nurse found pt walking around unsteadily outside in their pajamas. Per EMS, HH reported pt's SpO2 was initially in the 70's, fire stated pt was 95% on RA and when EMS helped pt walk to the stretcher pt's SpO2 dropped to 78% but came back up to 100% on 3L. Per EMS, pt's son passed away a month or so ago and pt's husband reports pt hasn't been eating/drinking since they came home from [employer] [facility] last week after a recent surgery. Husband reports they were only have to get pt to take about 4 tablespoons of fluid yesterday and pt has been more altered recently. . Pt has a hx of afib and take eliquis  for the same. Pt is A&Ox3 during triage.

## 2024-09-29 NOTE — ED Notes (Signed)
 Called [facility] to add pt to monitoring.

## 2024-09-29 NOTE — ED Provider Notes (Addendum)
 6:35 PM Assumed care for off going team.   Blood pressure (!) 146/74, pulse (!) 45, temperature (!) 97.5 F (36.4 C), temperature source Oral, resp. rate 16, height 5' (1.524 m), weight 65.6 kg, SpO2 100%.  See their HPI for full report but in brief pt handed off.   Patient's urine is consistent with UTI discussed with family and she has been very weak they have reported a foul smell to urine.  She attempted to ambulate here but hardly take a few steps due to weakness.  A CT imaging of the head was negative for acute pathology.  Given this concern for acute weakness and encephalopathy will discuss to hospital team for admission for UTI and failure to thrive.   7:59 PM I have placed consult for admission at 6:30 PM and just prior to patient being admitted she was noted to go into A-fib with RVR.  Her EKG upon repeat was A-fib with a rate of 121 without any ST elevation or T wave inversions, normal intervals.  Blood pressures are reassuring so can try to give her a dose of diltiazem.  Does appear that she is on metoprolol for her paroxysmal A-fib.  I wonder if this is secondary to maybe some missed doses ?    Patient was given 5 of IV Dilt  8:52 PM patient seems to flip back into sinus bradycardia with a rate in the 40s.  9:19 PM  HR continue to be stable  Will repage out to hospitalist.    .Critical Care  Performed by: Ernest Ronal BRAVO, MD Authorized by: Ernest Ronal BRAVO, MD   Critical care provider statement:    Critical care time (minutes):  30   Critical care was necessary to treat or prevent imminent or life-threatening deterioration of the following conditions: afib with rvr.   Critical care was time spent personally by me on the following activities:  Development of treatment plan with patient or surrogate, discussions with consultants, evaluation of patient's response to treatment, examination of patient, ordering and review of laboratory studies, ordering and review of radiographic  studies, ordering and performing treatments and interventions, pulse oximetry, re-evaluation of patient's condition and review of old charts       Ernest Ronal BRAVO, MD 09/29/24 2119    Ernest Ronal BRAVO, MD 09/29/24 2131

## 2024-09-29 NOTE — H&P (Signed)
 " History and Physical    Patient: Laura Bishop FMW:[id number] DOB: Mar 07, 1941 DOA: 09/29/2024 DOS: the patient was seen and examined on 09/29/2024 PCP: Sherial Bail, MD  Patient coming from: {Point_of_Origin:[address]}  Chief Complaint:  Chief Complaint  Patient presents with   Failure To Thrive   Altered Mental Status   HPI: NYREE Bishop is a 83 y.o. female with medical history significant of ***  Review of Systems: {ROS_Text:26778} Past Medical History:  Diagnosis Date   Chronic kidney disease    chronic kidney disease stage III   Chronic lower back pain    Hyperlipidemia    Hypertension    Hypothyroidism    IDA (iron deficiency anemia)    Meningioma (HCC)    Osteoarthritis of both knees    osteoarthritis of knee   Osteoporosis    Overactive bladder    Vitamin D  deficiency    Past Surgical History:  Procedure Laterality Date   ABDOMINAL HYSTERECTOMY     APPENDECTOMY     BACK SURGERY     COLONOSCOPY WITH PROPOFOL  N/A 07/20/2016   Procedure: COLONOSCOPY WITH PROPOFOL ;  Surgeon: Lamar ONEIDA Holmes, MD;  Location: [facility] ENDOSCOPY;  Service: Endoscopy;  Laterality: N/A;   COLONOSCOPY WITH PROPOFOL  N/A 02/27/2019   Procedure: COLONOSCOPY WITH PROPOFOL ;  Surgeon: Holmes Lamar ONEIDA, MD;  Location[facility] ENDOSCOPY;  Service: Endoscopy;  Laterality: N/A;   ESOPHAGOGASTRODUODENOSCOPY (EGD) WITH PROPOFOL  N/A 07/20/2016   Procedure: ESOPHAGOGASTRODUODENOSCOPY (EGD) WITH PROPOFOL ;  Surgeon: Lamar ONEIDA Holmes, MD;  Location: [facility] ENDOSCOPY;  Service: Endoscopy;  Laterality: N/A;   ESOPHAGOGASTRODUODENOSCOPY (EGD) WITH PROPOFOL  N/A 01/29/2020   Procedure: ESOPHAGOGASTRODUODENOSCOPY (EGD) WITH PROPOFOL ;  Surgeon: Toledo, Ladell POUR, MD;  Location: [facility] ENDOSCOPY;  Service: Gastroenterology;  Laterality: N/A;   EYE SURGERY     extraction cataract   lobectomy total thyroid      Social History:  reports that she has never smoked. She has never used smokeless tobacco. She reports that  she does not drink alcohol and does not use drugs.  Allergies[1]  Family History  Problem Relation Age of Onset   Stomach cancer Mother    Lung cancer Father     Prior to Admission medications  Medication Sig Start Date End Date Taking? Authorizing Provider  acetaminophen  (TYLENOL ) 500 MG tablet Take by mouth.    [provider]  amLODipine  (NORVASC ) 5 MG tablet Take 10 mg by mouth daily.     [provider]  aspirin 81 MG tablet Take 81 mg by mouth daily.     [provider]  benzonatate  (TESSALON ) 200 MG capsule Take 1 capsule (200 mg total) by mouth 3 (three) times daily as needed. Patient not taking: Reported on 12/15/2019 08/18/19   Servando Hire, MD  calcium carbonate (OSCAL) 1500 (600 Ca) MG TABS tablet Take 600 mg of elemental calcium by mouth daily with breakfast.  Patient not taking: Reported on 03/27/2020    [provider]  chlorhexidine (PERIDEX) 0.12 % solution chlorhexidine gluconate 0.12 % mouthwash  SWISH AND SPIT WITH 1 CAPSUL FOR 30 SEC 3 TIMES DAILY 10/31/19   [provider]  diphenhydramine-acetaminophen  (TYLENOL  PM) 25-500 MG TABS tablet Take 1 tablet by mouth at bedtime as needed.    [provider]  ferrous sulfate 325 (65 FE) MG EC tablet Take 325 mg by mouth daily. Patient not taking: Reported on 03/27/2020    [provider]  Fluocinolone Acetonide (DERMOTIC OT) Place 3 drops in ear(s) as needed.  [provider]  hydrochlorothiazide (HYDRODIURIL) 12.5 MG tablet hydrochlorothiazide 12.5 mg tablet    [provider]  levothyroxine  (SYNTHROID , LEVOTHROID) 75 MCG tablet Take 75 mcg by mouth daily before breakfast.    [provider]  losartan  (COZAAR ) 25 MG tablet losartan  25 mg tablet  TAKE 1 TABLET BY MOUTH EVERY DAY 05/31/19   [provider]  metaxalone (SKELAXIN) 800 MG tablet Take 800 mg by mouth as needed for muscle spasms. Patient not taking: Reported on  03/27/2020    [provider]  Multiple Vitamin (MULTIVITAMIN) tablet Take 1 tablet by mouth daily.    [provider]  oxybutynin (DITROPAN) 5 MG tablet Take 5 mg by mouth 2 (two) times daily.    [provider]  pravastatin (PRAVACHOL) 80 MG tablet Take 80 mg by mouth at bedtime.  Patient not taking: Reported on 03/27/2020    [provider]  ranitidine (ZANTAC) 75 MG tablet Take 75 mg by mouth 2 (two) times daily as needed for heartburn.  Patient not taking: Reported on 03/27/2020    [provider]  SALONPAS PAIN RELIEVING 4 % PTCH Apply 1 patch topically as needed.  11/09/19   [provider]  VITAMIN D  PO Take 125 mcg by mouth daily.    [provider]    Physical Exam: Vitals:   09/29/24 1954 09/29/24 1956 09/29/24 2100 09/29/24 2109  BP:  (!) 102/59 122/64 120/68  Pulse:  81  (!) 53  Resp:  20 16 20   Temp: 97.7 F (36.5 C)     TempSrc: Oral     SpO2:  98%    Weight:      Height:       *** Data Reviewed: {Tip this will not be part of the note when signed- Document your independent interpretation of telemetry tracing, EKG, lab, Radiology test or any other diagnostic tests. Add any new diagnostic test ordered today. (Optional):26781} {Results:26384}  Assessment and Plan: No notes have been filed under this hospital service. Service: Hospitalist     Advance Care Planning:   Code Status: Not on file ***  Consults: ***  Family Communication: ***  Severity of Illness: {Observation/Inpatient:21159}  Author: Posey Maier, DO 09/29/2024 9:34 PM  For on call review [email or web address].     [1]  Allergies Allergen Reactions   Other Other (See Comments)    Contraindicated by renal insufficiency Contraindicated by renal insufficiency    Estrogens    Nsaids    "

## 2024-09-29 NOTE — ED Notes (Signed)
 Pt ambulated to toilet with assistance from staff. Pt's O2 sat dropped into the upper 70's but it was NOT a good pleth. Pt returned to bed and pt's SpO2 returned to 100% within a minute of the pt being back in the bed. Pt appeared SOB but had no complaints other than being tired. Pt's bed was placed in the lowest, locked position with call bell in reach.

## 2024-09-29 NOTE — ED Notes (Signed)
 Called lab to have Trop and BNP added on to already sent blood work. Lab tech said she would relay message to processor.

## 2024-09-29 NOTE — ED Notes (Signed)
 Called lab to ask about the add on troponin and lab stated that they would get it added-on.

## 2024-09-29 NOTE — ED Provider Notes (Signed)
 "  [facility] Provider Note    Event Date/Time   First MD Initiated Contact with Patient 09/29/24 1152     (approximate)   History   Failure To Thrive and Altered Mental Status  Pt arrives via [facility] from home after the home health nurse found pt walking around unsteadily outside in their pajamas. Per EMS, HH reported pt's SpO2 was initially in the 70's, fire stated pt was 95% on RA and when EMS helped pt walk to the stretcher pt's SpO2 dropped to 78% but came back up to 100% on 3L. Per EMS, pt's son passed away a month or so ago and pt's husband reports pt hasn't been eating/drinking since they came home from [facility] last week after a recent surgery. Husband reports they were only have to get pt to take about 4 tablespoons of fluid yesterday and pt has been more altered recently. . Pt has a hx of afib and take eliquis  for the same. Pt is A&Ox3 during triage.    HPI Laura Bishop is a 83 y.o. female PMH CKD, hypertension, hyperlipidemia, iron deficiency anemia, meningioma, paroxysmal A-fib on Eliquis  presents for failure to thrive, possible hypoxia -Per EMS, home health thought patient's room air oxygen was in the 70s though on evaluation by fire department was 95% on room air.  When patient walked with EMS, desatted to about 78% though improved to 100% on 3 L nasal cannula.  Son reportedly passed away a month or so ago and patient's husband reports she has not been eating or drinking since they came home from [facility] last week after surgery.   - On my history, patient is a pretty limited historian.  Collateral gathered from her husband bedside.  Notes that she has been a little more confused than usual over the past week or so.  Has also had decreased p.o. intake over the past week. -No recent falls -Notes there was a death in the family in 06-26-24 of this year     Physical Exam   Triage Vital Signs: ED Triage Vitals  Encounter Vitals Group     BP  09/29/24 1158 119/68     Girls Systolic BP Percentile --      Girls Diastolic BP Percentile --      Boys Systolic BP Percentile --      Boys Diastolic BP Percentile --      Pulse Rate 09/29/24 1158 (!) 54     Resp 09/29/24 1158 17     Temp 09/29/24 1158 (!) 97.5 F (36.4 C)     Temp Source 09/29/24 1158 Rectal     SpO2 09/29/24 1156 95 %     Weight 09/29/24 1201 144 lb 10 oz (65.6 kg)     Height 09/29/24 1201 5' (1.524 m)     Head Circumference --      Peak Flow --      Pain Score 09/29/24 1159 5     Pain Loc --      Pain Education --      Exclude from Growth Chart --     Most recent vital signs: Vitals:   09/29/24 1530 09/29/24 1600  BP: 126/72 138/72  Pulse: (!) 54 (!) 54  Resp: 17 16  Temp:    SpO2: 100% 100%     General: Awake, no distress.  CV:  Good peripheral perfusion. RRR, RP 2+ Resp:  Normal effort. CTAB Abd:  No distention. Nontender to deep palpation  throughout Neuro:  Alert and oriented, face symmetric, moving all extremities spontaneously, no focal motor deficit appreciated   ED Results / Procedures / Treatments   Labs (all labs ordered are listed, but only abnormal results are displayed) Labs Reviewed  COMPREHENSIVE METABOLIC PANEL WITH GFR - Abnormal; Notable for the following components:      Result Value   CO2 20 (*)    BUN 30 (*)    Creatinine, Ser 1.93 (*)    Calcium 11.9 (*)    Total Protein 6.0 (*)    GFR, Estimated 25 (*)    All other components within normal limits  CBC - Abnormal; Notable for the following components:   RBC 3.27 (*)    Hemoglobin 9.8 (*)    HCT 31.3 (*)    RDW 16.1 (*)    All other components within normal limits  PRO BRAIN NATRIURETIC PEPTIDE - Abnormal; Notable for the following components:   Pro Brain Natriuretic Peptide 579.0 (*)    All other components within normal limits  TROPONIN T, HIGH SENSITIVITY - Abnormal; Notable for the following components:   Troponin T High Sensitivity 56 (*)    All other  components within normal limits  TROPONIN T, HIGH SENSITIVITY - Abnormal; Notable for the following components:   Troponin T High Sensitivity 52 (*)    All other components within normal limits  RESP PANEL BY RT-PCR (RSV, FLU A&B, COVID)  RVPGX2  URINALYSIS, ROUTINE W REFLEX MICROSCOPIC  CBG MONITORING, ED  CBG MONITORING, ED     EKG  Ecg = sinus rhythm, rate 54, no gross ST elevation or depression, no significant repolarization abnormality, left axis deviation, normal intervals.  No clear evidence of ischemia nor arrhythmia my interpretation.   RADIOLOGY Radiology interpreted by myself radiology report reviewed.  No acute pathology identified.    PROCEDURES:  Critical Care performed: No  Procedures   MEDICATIONS ORDERED IN ED: Medications  sodium chloride  0.9 % bolus 500 mL (0 mLs Intravenous Stopped 09/29/24 1450)     IMPRESSION / MDM / ASSESSMENT AND PLAN / ED COURSE  I reviewed the triage vital signs and the nursing notes.                              DDX/MDM/AP: Differential diagnosis includes, but is not limited to, consider underlying UTI, viral syndrome including influenza or COVID-19, pneumonia.  Doubt intra-abdominal pathology.  No clear evidence of stroke, doubt intracranial mass or hemorrhage.  Consider possibility of depression contributing to symptomatology.  Consider electrolyte abnormality.  Do suspect some degree of dehydration given decreased p.o. intake.  Plan: - Labs - IV fluid -  CT head - Chest x-ray - EKG Reassesse  Patient's presentation is most consistent with acute presentation with potential threat to life or bodily function.  The patient is on the cardiac monitor to evaluate for evidence of arrhythmia and/or significant heart rate changes.  ED course below.  Has been doing well on room air here in emergency department with no recurrent hypoxia, unclear if she may have had spuriously reading/poor waveform previously.  Laboratory workup  overall unremarkable, AKI present, given small bolus IV fluid.  Signed out to oncoming ED provider pending urinalysis and ambulation trial.  Patient appears to be mentating well, responds to questions appropriately.  If recurrent hypoxia or any recurrent concerns about mental status, low threshold for admission.   Clinical Course as of 09/29/24 1705  Fri Sep 29, 2024  1341 CMP with AKI on CKD and mildly low bicarb, and getting IV fluid [MM]  1342 CBC within baseline range when compared to outpatient labs.  No leukocytosis. [MM]  1511 Viral swab neg [MM]  1511 Repeat troponin stable  BNP very elevated [MM]  1511 CXR: IMPRESSION: 1. Probable atelectatic changes at the left lung base and elevated left hemidiaphragm.   [MM]    Clinical Course User Index [MM] Clarine Ozell LABOR, MD     FINAL CLINICAL IMPRESSION(S) / ED DIAGNOSES   Final diagnoses:  Decreased appetite  Other fatigue     Rx / DC Orders   ED Discharge Orders     None        Note:  This document was prepared using Dragon voice recognition software and may include unintentional dictation errors.   Clarine Ozell LABOR, MD 09/29/24 1705  "

## 2024-09-29 NOTE — ED Notes (Signed)
 Pt's CBG was 77. Provided pt with orange juice.

## 2024-09-29 NOTE — ED Notes (Signed)
 Pt unable to ambulate farther than the foot of the bed, stated that they were too tired. Pt's SpO2 stayed in the upper 90's while moving.

## 2024-09-30 DIAGNOSIS — N390 Urinary tract infection, site not specified: Secondary | ICD-10-CM | POA: Diagnosis not present

## 2024-09-30 LAB — CBC
HCT: 31.9 % — ABNORMAL LOW (ref 36.0–46.0)
Hemoglobin: 10.1 g/dL — ABNORMAL LOW (ref 12.0–15.0)
MCH: 30.5 pg (ref 26.0–34.0)
MCHC: 31.7 g/dL (ref 30.0–36.0)
MCV: 96.4 fL (ref 80.0–100.0)
Platelets: 358 K/uL (ref 150–400)
RBC: 3.31 MIL/uL — ABNORMAL LOW (ref 3.87–5.11)
RDW: 16.2 % — ABNORMAL HIGH (ref 11.5–15.5)
WBC: 9.8 K/uL (ref 4.0–10.5)
nRBC: 0 % (ref 0.0–0.2)

## 2024-09-30 LAB — COMPREHENSIVE METABOLIC PANEL WITH GFR
ALT: 12 U/L (ref 0–44)
AST: 17 U/L (ref 15–41)
Albumin: 3.5 g/dL (ref 3.5–5.0)
Alkaline Phosphatase: 50 U/L (ref 38–126)
Anion gap: 16 — ABNORMAL HIGH (ref 5–15)
BUN: 27 mg/dL — ABNORMAL HIGH (ref 8–23)
CO2: 18 mmol/L — ABNORMAL LOW (ref 22–32)
Calcium: 11.9 mg/dL — ABNORMAL HIGH (ref 8.9–10.3)
Chloride: 109 mmol/L (ref 98–111)
Creatinine, Ser: 1.66 mg/dL — ABNORMAL HIGH (ref 0.44–1.00)
GFR, Estimated: 30 mL/min — ABNORMAL LOW
Glucose, Bld: 77 mg/dL (ref 70–99)
Potassium: 4.2 mmol/L (ref 3.5–5.1)
Sodium: 143 mmol/L (ref 135–145)
Total Bilirubin: <0.2 mg/dL (ref 0.0–1.2)
Total Protein: 6 g/dL — ABNORMAL LOW (ref 6.5–8.1)

## 2024-09-30 LAB — PHOSPHORUS: Phosphorus: 2 mg/dL — ABNORMAL LOW (ref 2.5–4.6)

## 2024-09-30 LAB — MAGNESIUM: Magnesium: 2.1 mg/dL (ref 1.7–2.4)

## 2024-09-30 MED ORDER — LOSARTAN POTASSIUM 25 MG PO TABS
25.0000 mg | ORAL_TABLET | Freq: Every day | ORAL | Status: DC
  Administered 2024-09-30 – 2024-10-01 (×2): 25 mg via ORAL
  Filled 2024-09-30 (×2): qty 1

## 2024-09-30 MED ORDER — ACETAMINOPHEN 325 MG PO TABS: 650.0000 mg | ORAL_TABLET | Freq: Four times a day (QID) | ORAL | Status: DC | PRN

## 2024-09-30 MED ORDER — ONDANSETRON HCL 4 MG/2ML IJ SOLN: 4.0000 mg | Freq: Four times a day (QID) | INTRAMUSCULAR | Status: DC | PRN

## 2024-09-30 MED ORDER — HALOPERIDOL LACTATE 5 MG/ML IJ SOLN
5.0000 mg | Freq: Four times a day (QID) | INTRAMUSCULAR | Status: DC | PRN
  Administered 2024-09-30 – 2024-10-01 (×2): 5 mg via INTRAMUSCULAR
  Filled 2024-09-30 (×2): qty 1

## 2024-09-30 MED ORDER — POTASSIUM PHOSPHATES 15 MMOLE/5ML IV SOLN
30.0000 mmol | Freq: Once | INTRAVENOUS | Status: AC
  Administered 2024-09-30: 30 mmol via INTRAVENOUS
  Filled 2024-09-30: qty 10

## 2024-09-30 MED ORDER — LEVOTHYROXINE SODIUM 50 MCG PO TABS
75.0000 ug | ORAL_TABLET | Freq: Every day | ORAL | Status: DC
  Administered 2024-10-01 – 2024-10-08 (×8): 75 ug via ORAL
  Filled 2024-09-30 (×8): qty 1

## 2024-09-30 MED ORDER — ACETAMINOPHEN 650 MG RE SUPP: 650.0000 mg | Freq: Four times a day (QID) | RECTAL | Status: DC | PRN

## 2024-09-30 MED ORDER — ONDANSETRON HCL 4 MG PO TABS: 4.0000 mg | ORAL_TABLET | Freq: Four times a day (QID) | ORAL | Status: DC | PRN

## 2024-09-30 MED ORDER — ENSURE PLUS HIGH PROTEIN PO LIQD
237.0000 mL | Freq: Two times a day (BID) | ORAL | Status: DC
  Administered 2024-10-01 – 2024-10-02 (×3): 237 mL via ORAL

## 2024-09-30 MED ORDER — APIXABAN 2.5 MG PO TABS
2.5000 mg | ORAL_TABLET | Freq: Two times a day (BID) | ORAL | Status: DC
  Administered 2024-09-30 – 2024-10-08 (×17): 2.5 mg via ORAL
  Filled 2024-09-30 (×17): qty 1

## 2024-09-30 MED ORDER — SODIUM CHLORIDE 0.9 % IV SOLN
1.0000 g | INTRAVENOUS | Status: DC
  Administered 2024-09-30: 1 g via INTRAVENOUS
  Filled 2024-09-30 (×3): qty 10

## 2024-09-30 MED ORDER — LACTATED RINGERS IV SOLN: INTRAVENOUS | Status: AC

## 2024-09-30 MED ORDER — HALOPERIDOL 0.5 MG PO TABS
5.0000 mg | ORAL_TABLET | Freq: Four times a day (QID) | ORAL | Status: DC | PRN
  Filled 2024-09-30: qty 10

## 2024-09-30 MED ORDER — AMLODIPINE BESYLATE 10 MG PO TABS
10.0000 mg | ORAL_TABLET | Freq: Every day | ORAL | Status: DC
  Administered 2024-09-30 – 2024-10-01 (×2): 10 mg via ORAL
  Filled 2024-09-30 (×2): qty 1

## 2024-09-30 NOTE — Evaluation (Signed)
 Occupational Therapy Evaluation Patient Details Name: Laura Bishop MRN: [id number] DOB: 29-Jan-1941 Today's Date: 09/30/2024   History of Present Illness   Laura Bishop is a 83 y.o. female with medical history significant of hypertension, CKD stage 3, hypothyroidism, history of lacunar infarct, atrial fibrillation on Eliquis , hypercalcemia who presents to the emergency department via EMS from home due to altered mental status.  Patient's home health nurse noted patient being unstable on her feet.  O2 sat checked was 95% on room air, EMS was activated and on arrival of EMS team, patient O2 sat dropped to 78% when she walked to the stretcher, 3 LPM of oxygen via  was provided with improved O2 sat to 100%.  She recently had a fall whereby she sustained fracture of surgical neck of left humerus which was repaired at [facility] and she also recently lost her son about a month ago, so she has not been eating/drinking normally since after the surgery     Clinical Impressions Upon entering patient with nursing and path.  Husband only caretake at home with  pt  - married more than 60 years.  Patient has been receiving home health PT OT and nursing.  Patient had left reverse shoulder arthroplasty October 25.  After fall.  Husband reports patient had 2 falls this year.  Patient report patient max assist to dependent for bathing and dressing.  As well as toileting.  Husband assist with all.  Has been ambulating with a single-point cane with home health.  Patient with decreased appetite and not eating and drinking the last month or 2 since son passed away.  Husband does homemaking tasks and driving.  Patient has somebody from church that comes and stay with her if he has appointments.  No family to assist.  Patient alert orientated to person and month and holiday.  But not place.  Inconsistent with following directions.  Patient was ambulatory with rolling walker supervision in room.  But Max mod assist for bathing  and dressing and toileting.  Patient can benefit from skilled OT services to increase independence ADLs and functional mobility, balance and safety in ADLs-  Would recommend skilled nursing rehab at discharge at this time..     If plan is discharge home, recommend the following:         Functional Status Assessment         Equipment Recommendations         Recommendations for Other Services         Precautions/Restrictions   Precautions Precautions: Fall     Mobility Bed Mobility                    Transfers                   General transfer comment: Pt up coming with nsg from bathroom- ambulate with RW ind with supervision but bend FW , transfer and reposition in chair Ind with S      Balance                                           ADL either performed or assessed with clinical judgement   ADL  General ADL Comments: Per husband and nsg aid - dependent in UB and LB dressing and bathing- S to CG toilet transfer but mod A for hygiene - grooming Mod A and verbal cuesing- no appetite     Vision Baseline Vision/History: 1 Wears glasses Patient Visual Report: No change from baseline       Perception         Praxis         Pertinent Vitals/Pain Pain Assessment Pain Assessment:  (pt denies pain)     Extremity/Trunk Assessment Upper Extremity Assessment Upper Extremity Assessment: Right hand dominant (R UE WFL , L 90 degrees flexion of shoulder - had Oct 25 Reverse L shoulder arthroplasty)           Communication Communication Communication: No apparent difficulties   Cognition Arousal: Alert                 OT - Cognition Comments: Alert to person, her adress and month/holiday - but not place                 Following commands:  (inconsistant - beginning stages of dementia)       Cueing  General Comments   Cueing Techniques:  Tactile cues  sitting good- ambulate with RW supervisiion  and transfers supervision   Exercises     Shoulder Instructions      Home Living Family/patient expects to be discharged to:: Private residence Living Arrangements: Spouse/significant other Available Help at Discharge:  (Just husband and church friend comes and help if needed)         Home Layout: One level     Bathroom Shower/Tub: Walk-in shower;Tub/shower unit             Additional Comments: used [facility] when had HH prior to admission      Prior Functioning/Environment               Mobility Comments: [facility] with HH ADLs Comments: per husband pt was dependent in bathing and dressing - hed had to help toiletting, and meals but she did not heat - no appetite    OT Problem List: Decreased activity tolerance;Decreased safety awareness;Decreased range of motion;Decreased cognition   OT Treatment/Interventions: Self-care/ADL training;Therapeutic exercise;Neuromuscular education;Patient/family education;Balance training;Therapeutic activities;DME and/or AE instruction      OT Goals(Current goals can be found in the care plan section)   Acute Rehab OT Goals Patient Stated Goal: Going to rehab OT Goal Formulation: With family Time For Goal Achievement: 10/07/24 Potential to Achieve Goals: Good   OT Frequency:  Min 2X/week    Co-evaluation              AM-PAC OT 6 Clicks Daily Activity     Outcome Measure Help from another person eating meals?: A Little Help from another person taking care of personal grooming?: A Little Help from another person toileting, which includes using toliet, bedpan, or urinal?: A Lot Help from another person bathing (including washing, rinsing, drying)?: A Lot Help from another person to put on and taking off regular upper body clothing?: A Lot Help from another person to put on and taking off regular lower body clothing?: A Lot 6 Click Score: 14   End of Session  Equipment Utilized During Treatment: Gait belt  Activity Tolerance: Patient tolerated treatment well Patient left: in chair;with family/visitor present  OT Visit Diagnosis: History of falling (Z91.81);Muscle weakness (generalized) (M62.81);Cognitive communication deficit (R41.841)  Time: 8844-8779 OT Time Calculation (min): 25 min Charges:  OT General Charges $OT Visit: 1 Visit OT Evaluation $OT Eval Low Complexity: 1 Low    Zeph Riebel OTR/L,CLT 09/30/2024, 1:17 PM

## 2024-09-30 NOTE — Plan of Care (Signed)
 m

## 2024-09-30 NOTE — Progress Notes (Signed)
 " PROGRESS NOTE   HPI was taken from Dr. Manfred: Laura Bishop is a 83 y.o. female with medical history significant of hypertension, CKD stage 3, hypothyroidism, history of lacunar infarct, atrial fibrillation on Eliquis , hypercalcemia who presents to the emergency department via EMS from home due to altered mental status.  Patient's home health nurse noted patient being unstable on her feet.  O2 sat checked was 95% on room air, EMS was activated and on arrival of EMS team, patient O2 sat dropped to 78% when she walked to the stretcher, 3 LPM of oxygen via [place] was provided with improved O2 sat to 100%.  She recently had a fall whereby she sustained fracture of surgical neck of left humerus which was repaired at [facility] and she also recently lost her son about a month ago, so she has not been eating/drinking normally since after the surgery   ED course In the emergency department, she was bradycardic.  Workup in the ED showed normocytic anemia.  BUN 30, creatinine 1.93 (baseline creatinine 1.5 -Care Everywhere).  Urinalysis was suggestive of UTI, proBNP 579, troponin 56 > 52 CT of head showed no acute intracranial hemorrhage or mass effect Patient was started on IV ceftriaxone .  IV Cardizem  5 mg x 1 was given due to paroxysmal atrial fibrillation.  IV hydration was provided    FREDERICKA BOTTCHER  FMW:[id number] DOB: 05/04/1941 DOA: 09/29/2024 PCP: Sherial Bail, MD   Assessment & Plan:   Principal Problem:   UTI (urinary tract infection)  Assessment and Plan: UTI: urine cx is pending. Continue on IV rocephin     Generalized weakness: PT/OT consulted   AKI on CKDIV: Cr is trending down from day prior. Continue on IVFs  Poor self care: unable to take care of self and pt's husband is struggling to take care of pt. Pt's husband would like rehab if possible   ACD: likely secondary to CKD. No need for a transfusion currently   Elevated troponin: possible secondary to type II demand ischemia.  Trending down   Hypercalcemia: holding home dose of os-cal. PTH is pending    PAF: w/ intermittent slow ventricular rate. Continue on home dose of eliquis . Holding metoprolol  secondary to bradycardia. Continue on tele    HTN: continue on home dose of losartan , amlodipine     HLD: continue on statin   Hypothyroidism: continue on home dose of synthroid        DVT prophylaxis: eliquis  Code Status: DNR Family Communication:  Disposition Plan: depends on PT/OT recs    Level of care: Telemetry Consultants:    Procedures:   Antimicrobials: rocephin   Subjective: Pt c/o fatigue  Objective: Vitals:   09/29/24 2109 09/30/24 0420 09/30/24 0525 09/30/24 0800  BP: 120/68 135/76 132/67 114/70  Pulse: (!) 53 (!) 54 (!) 54 (!) 56  Resp: 20  18 18   Temp:  97.6 F (36.4 C) (!) 97.5 F (36.4 C) (!) 97.5 F (36.4 C)  TempSrc:  Oral Oral   SpO2:  100% 98% 100%  Weight:      Height:       No intake or output data in the 24 hours ending 09/30/24 0901 Filed Weights   09/29/24 1201  Weight: 65.6 kg    Examination:  General exam: Appears calm and comfortable. Poor dentition Respiratory system: Clear to auscultation. Respiratory effort normal. Cardiovascular system: S1 & S2+. No rubs, gallops or clicks.  Gastrointestinal system: Abdomen is nondistended, soft and nontender. Normal bowel sounds heard. Central nervous system: Alert  and awake. Moves all extremities  Psychiatry: Judgement and insight appears poor. Flat mood and affect     Data Reviewed: I have personally reviewed following labs and imaging studies  CBC: Recent Labs  Lab 09/29/24 1211 09/30/24 0522  WBC 7.6 9.8  HGB 9.8* 10.1*  HCT 31.3* 31.9*  MCV 95.7 96.4  PLT 348 358   Basic Metabolic Panel: Recent Labs  Lab 09/29/24 1211 09/30/24 0522  NA 140 143  K 3.5 4.2  CL 105 109  CO2 20* 18*  GLUCOSE 76 77  BUN 30* 27*  CREATININE 1.93* 1.66*  CALCIUM 11.9* 11.9*  MG  --  2.1  PHOS  --  2.0*    GFR: Estimated Creatinine Clearance: 21.7 mL/min (A) (by C-G formula based on SCr of 1.66 mg/dL (H)). Liver Function Tests: Recent Labs  Lab 09/29/24 1211 09/30/24 0522  AST 17 17  ALT 12 12  ALKPHOS 49 50  BILITOT 0.2 <0.2  PROT 6.0* 6.0*  ALBUMIN 3.5 3.5   No results for input(s): LIPASE, AMYLASE in the last 168 hours. No results for input(s): AMMONIA in the last 168 hours. Coagulation Profile: No results for input(s): INR, PROTIME in the last 168 hours. Cardiac Enzymes: No results for input(s): CKTOTAL, CKMB, CKMBINDEX, TROPONINI in the last 168 hours. BNP (last 3 results) Recent Labs    09/29/24 1401  PROBNP 579.0*   HbA1C: No results for input(s): HGBA1C in the last 72 hours. CBG: Recent Labs  Lab 09/29/24 1212 09/29/24 1258  GLUCAP 77 81   Lipid Profile: No results for input(s): CHOL, HDL, LDLCALC, TRIG, CHOLHDL, LDLDIRECT in the last 72 hours. Thyroid  Function Tests: No results for input(s): TSH, T4TOTAL, FREET4, T3FREE, THYROIDAB in the last 72 hours. Anemia Panel: No results for input(s): VITAMINB12, FOLATE, FERRITIN, TIBC, IRON, RETICCTPCT in the last 72 hours. Sepsis Labs: No results for input(s): PROCALCITON, LATICACIDVEN in the last 168 hours.  Recent Results (from the past 240 hours)  Resp panel by RT-PCR (RSV, Flu A&B, Covid) Anterior Nasal Swab     Status: None   Collection Time: 09/29/24  2:01 PM   Specimen: Anterior Nasal Swab  Result Value Ref Range Status   SARS Coronavirus 2 by RT PCR NEGATIVE NEGATIVE Final    Comment: (NOTE) SARS-CoV-2 target nucleic acids are NOT DETECTED.  The SARS-CoV-2 RNA is generally detectable in upper respiratory specimens during the acute phase of infection. The lowest concentration of SARS-CoV-2 viral copies this assay can detect is 138 copies/mL. A negative result does not preclude SARS-Cov-2 infection and should not be used as the sole basis for  treatment or other patient management decisions. A negative result may occur with  improper specimen collection/handling, submission of specimen other than nasopharyngeal swab, presence of viral mutation(s) within the areas targeted by this assay, and inadequate number of viral copies(<138 copies/mL). A negative result must be combined with clinical observations, patient history, and epidemiological information. The expected result is Negative.  Fact Sheet for Patients:  bloggercourse.com  Fact Sheet for Healthcare Providers:  seriousbroker.it  This test is no t yet approved or cleared by the [place]  FDA and  has been authorized for detection and/or diagnosis of SARS-CoV-2 by FDA under an Emergency Use Authorization (EUA). This EUA will remain  in effect (meaning this test can be used) for the duration of the COVID-19 declaration under Section 564(b)(1) of the Act, 21 U.S.C.section 360bbb-3(b)(1), unless the authorization is terminated  or revoked sooner.  Influenza A by PCR NEGATIVE NEGATIVE Final   Influenza B by PCR NEGATIVE NEGATIVE Final    Comment: (NOTE) The Xpert Xpress SARS-CoV-2/FLU/RSV plus assay is intended as an aid in the diagnosis of influenza from Nasopharyngeal swab specimens and should not be used as a sole basis for treatment. Nasal washings and aspirates are unacceptable for Xpert Xpress SARS-CoV-2/FLU/RSV testing.  Fact Sheet for Patients: bloggercourse.com  Fact Sheet for Healthcare Providers: seriousbroker.it  This test is not yet approved or cleared by the [place]  FDA and has been authorized for detection and/or diagnosis of SARS-CoV-2 by FDA under an Emergency Use Authorization (EUA). This EUA will remain in effect (meaning this test can be used) for the duration of the COVID-19 declaration under Section 564(b)(1) of the Act, 21  U.S.C. section 360bbb-3(b)(1), unless the authorization is terminated or revoked.     Resp Syncytial Virus by PCR NEGATIVE NEGATIVE Final    Comment: (NOTE) Fact Sheet for Patients: bloggercourse.com  Fact Sheet for Healthcare Providers: seriousbroker.it  This test is not yet approved or cleared by the [place]  FDA and has been authorized for detection and/or diagnosis of SARS-CoV-2 by FDA under an Emergency Use Authorization (EUA). This EUA will remain in effect (meaning this test can be used) for the duration of the COVID-19 declaration under Section 564(b)(1) of the Act, 21 U.S.C. section 360bbb-3(b)(1), unless the authorization is terminated or revoked.  Performed at [facility], [address], [place], [place] [address]          Radiology Studies: DG Chest Portable 1 View Result Date: 09/29/2024 EXAM: 1 VIEW(S) XRAY OF THE CHEST 09/29/2024 12:42:13 PM COMPARISON: 07/28/2024 CLINICAL HISTORY: hypoxia FINDINGS: LUNGS AND PLEURA: Probable atelectatic changes at left lung base. Elevated left hemidiaphragm. No pleural effusion. No pneumothorax. HEART AND MEDIASTINUM: No acute abnormality of the cardiac and mediastinal silhouettes. BONES AND SOFT TISSUES: Left reverse shoulder arthroplasty noted. IMPRESSION: 1. Probable atelectatic changes at the left lung base and elevated left hemidiaphragm. Electronically signed by: Waddell Calk MD 09/29/2024 02:54 PM EST RP Workstation: [id number]   CT HEAD WO CONTRAST ( ) Result Date: 09/29/2024 EXAM: CT HEAD WITHOUT CONTRAST 09/29/2024 01:31:34 PM TECHNIQUE: CT of the head was performed without the administration of intravenous contrast. Automated exposure control, iterative reconstruction, and/or weight based adjustment of the mA/kV was utilized to reduce the radiation dose to as low as reasonably achievable. COMPARISON: CT head 07/28/2024 and MRI brain 06/22/2018.  CLINICAL HISTORY: AMS. FINDINGS: BRAIN AND VENTRICLES: No acute hemorrhage. No evidence of acute territorial infarct. Chronic right thalamic lacunar infarction. Chronic lacunar infarction in the right extreme capsule. Overall unchanged mild-to-moderate scattered white matter hypodensities which are nonspecific but most commonly represent chronic microvascular ischemic changes. 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 Again partially empty sella, nonspecific. No hydrocephalus. No suspicious extra-axial collection. No mass effect or midline shift. ORBITS: Bilateral lens replacement. SINUSES: Again trace right mastoid effusion. No acute abnormality. SOFT TISSUES AND SKULL: No acute soft tissue abnormality. No skull fracture. IMPRESSION: 1. No acute intracranial hemorrhage or mass effect. 2. No substantial change since 07/28/2024. Electronically signed by: Prentice Spade MD 09/29/2024 01:59 PM EST RP Workstation: [id number]        Scheduled Meds:  feeding supplement  237 mL Oral BID BM   Continuous Infusions:  cefTRIAXone  (ROCEPHIN )  IV     lactated ringers  70 mL/hr at 09/30/24 0423   potassium PHOSPHATE  IVPB (in mmol)       LOS: 1 day  Anthony CHRISTELLA Pouch, MD [facility] Pager [phone]  If 7PM-7AM, please contact night-coverage [email or web address] 09/30/2024, 9:01 AM   "

## 2024-09-30 NOTE — Evaluation (Signed)
 Physical Therapy Evaluation Patient Details Name: Laura Bishop MRN: [id number] DOB: July 03, 1941 Today's Date: 09/30/2024  History of Present Illness  Laura Bishop is a 83 y.o. female with medical history significant of hypertension, CKD stage 3, hypothyroidism, history of lacunar infarct, atrial fibrillation on Eliquis , hypercalcemia who presents to the emergency department via EMS from home due to altered mental status.  Patient's home health nurse noted patient being unstable on her feet.  O2 sat checked was 95% on room air, EMS was activated and on arrival of EMS team, patient O2 sat dropped to 78% when she walked to the stretcher, 3 LPM of oxygen via [place] was provided with improved O2 sat to 100%.  She recently had a fall whereby she sustained fracture of surgical neck of left humerus which was repaired at [facility] and she also recently lost her son about a month ago, so she has not been eating/drinking normally since after the surgery  Clinical Impression  Patient noted to be in supine position at PT arrival in room, for an initial PT evaluation due to a decline in functional status, with baseline mobility reported as modI with history of falls, and currently requiring CGA for safety during most mobilities. The patient is A&O x 1, presenting with varying willingness to work with PT throughout session and goals of going home. The patient resides in a house and lives husband  with limited family/friend support. Per husband in room is is the pt main caregiver. There are 2 STE inside the residence.  Vitals are decline to an SpO? of 86% on RA after ambulation distance of 75' feet. Gait was assessed with RW, limited by cognition with limited ability to follow commands. The overall clinical impression is that the patient presents with moderate mobility limitations secondary to UTI. Recommended skilled PT will address safety, mobility, and discharge planning.     If plan is discharge home, recommend the  following: A little help with walking and/or transfers;A little help with bathing/dressing/bathroom;Help with stairs or ramp for entrance;Assist for transportation;Assistance with feeding;Assistance with cooking/housework;Supervision due to cognitive status   Can travel by private vehicle        Equipment Recommendations Rolling walker (2 wheels)  Recommendations for Other Services       Functional Status Assessment Patient has had a recent decline in their functional status and demonstrates the ability to make significant improvements in function in a reasonable and predictable amount of time.     Precautions / Restrictions Precautions Precautions: Fall      Mobility  Bed Mobility Overal bed mobility: Needs Assistance Bed Mobility: Supine to Sit     Supine to sit: Supervision, Contact guard, HOB elevated, Used rails     General bed mobility comments: pt in bed with legs elevated with pt. attempting to leave the hospital    Transfers Overall transfer level: Needs assistance Equipment used: Rolling walker (2 wheels) Transfers: Sit to/from Stand Sit to Stand: Contact guard assist, Supervision           General transfer comment: Pt although very unsteady able to stand mostly independently contact supervision provided to maintain pt. safety    Ambulation/Gait Ambulation/Gait assistance: Contact guard assist, Min assist Gait Distance (Feet): 75 Feet Assistive device: Rolling walker (2 wheels) Gait Pattern/deviations: Step-to pattern, Step-through pattern, Drifts right/left, Staggering right, Trunk flexed, Narrow base of support Gait velocity: decreased     General Gait Details: often drifts to find additional support although she has the RW; poor  awareness of RW and occasionally not following commands; pt. gradually destated to 86% on RA during ambulation eventually slowed down due to fatigue during ambulation/attempt to escape the hosipital  Stairs             Wheelchair Mobility     Tilt Bed    Modified Rankin (Stroke Patients Only)       Balance Overall balance assessment: Needs assistance Sitting-balance support: Feet supported Sitting balance-Leahy Scale: Fair     Standing balance support: Reliant on assistive device for balance, During functional activity Standing balance-Leahy Scale: Poor                               Pertinent Vitals/Pain Pain Assessment Pain Assessment: PAINAD Breathing: normal Negative Vocalization: none Facial Expression: smiling or inexpressive Body Language: relaxed Consolability: no need to console PAINAD Score: 0    Home Living Family/patient expects to be discharged to:: Private residence Living Arrangements: Spouse/significant other Available Help at Discharge: Family;Available 24 hours/day Type of Home: House Home Access: Stairs to enter Entrance Stairs-Rails: Right Entrance Stairs-Number of Steps: 2   Home Layout: One level   Additional Comments: used [facility] when had HH prior to admission    Prior Function Prior Level of Function : Independent/Modified Independent;History of Falls (last six months)             Mobility Comments: [facility] with HH; fell one week within leaving SNF ADLs Comments: per husband pt was dependent in bathing and dressing - hed had to help toiletting, and meals but she did not heat - no appetite     Extremity/Trunk Assessment   Upper Extremity Assessment Upper Extremity Assessment: Generalized weakness;Right hand dominant    Lower Extremity Assessment Lower Extremity Assessment: Generalized weakness    Cervical / Trunk Assessment Cervical / Trunk Assessment: Kyphotic  Communication   Communication Communication: No apparent difficulties    Cognition Arousal: Alert Behavior During Therapy: WFL for tasks assessed/performed, Agitated   PT - Cognitive impairments: History of cognitive impairments, Orientation, Awareness, Problem  solving, Safety/Judgement   Orientation impairments: Person, Place, Time                     Following commands: Impaired Following commands impaired: Follows one step commands inconsistently, Follows one step commands with increased time     Cueing Cueing Techniques: Tactile cues     General Comments General comments (skin integrity, edema, etc.): sitting good- ambulate with RW supervisiion  and transfers supervision    Exercises     Assessment/Plan    PT Assessment Patient needs continued PT services  PT Problem List Decreased strength;Decreased activity tolerance;Decreased mobility;Decreased balance       PT Treatment Interventions Gait training;Stair training;Functional mobility training;Therapeutic activities;Therapeutic exercise;Balance training;Neuromuscular re-education;Patient/family education    PT Goals (Current goals can be found in the Care Plan section)  Acute Rehab PT Goals PT Goal Formulation: Patient unable to participate in goal setting    Frequency Min 2X/week     Co-evaluation               AM-PAC PT 6 Clicks Mobility  Outcome Measure Help needed turning from your back to your side while in a flat bed without using bedrails?: None Help needed moving from lying on your back to sitting on the side of a flat bed without using bedrails?: None Help needed moving to and from a bed to a chair (  including a wheelchair)?: A Little Help needed standing up from a chair using your arms (e.g., wheelchair or bedside chair)?: A Little Help needed to walk in hospital room?: A Little Help needed climbing 3-5 steps with a railing? : A Lot 6 Click Score: 19    End of Session Equipment Utilized During Treatment: Gait belt Activity Tolerance: Patient tolerated treatment well Patient left: in bed;with call bell/phone within reach;with bed alarm set;with nursing/sitter in room Nurse Communication: Mobility status PT Visit Diagnosis: Other abnormalities  of gait and mobility (R26.89);Muscle weakness (generalized) (M62.81);Difficulty in walking, not elsewhere classified (R26.2);History of falling (Z91.81);Unsteadiness on feet (R26.81)    Time: 8497-8471 PT Time Calculation (min) (ACUTE ONLY): 26 min   Charges:   PT Evaluation $PT Eval Low Complexity: 1 Low   PT General Charges $$ ACUTE PT VISIT: 1 Visit         Sherlean Lesches DPT, PT    Sherlean A Lattin Basista 09/30/2024, 3:53 PM

## 2024-10-01 DIAGNOSIS — R001 Bradycardia, unspecified: Secondary | ICD-10-CM | POA: Diagnosis not present

## 2024-10-01 DIAGNOSIS — G9341 Metabolic encephalopathy: Secondary | ICD-10-CM

## 2024-10-01 DIAGNOSIS — R630 Anorexia: Secondary | ICD-10-CM

## 2024-10-01 DIAGNOSIS — R000 Tachycardia, unspecified: Secondary | ICD-10-CM

## 2024-10-01 DIAGNOSIS — I4891 Unspecified atrial fibrillation: Secondary | ICD-10-CM

## 2024-10-01 DIAGNOSIS — R5383 Other fatigue: Secondary | ICD-10-CM

## 2024-10-01 LAB — CBC
HCT: 30.5 % — ABNORMAL LOW (ref 36.0–46.0)
Hemoglobin: 9.8 g/dL — ABNORMAL LOW (ref 12.0–15.0)
MCH: 30.5 pg (ref 26.0–34.0)
MCHC: 32.1 g/dL (ref 30.0–36.0)
MCV: 95 fL (ref 80.0–100.0)
Platelets: 330 K/uL (ref 150–400)
RBC: 3.21 MIL/uL — ABNORMAL LOW (ref 3.87–5.11)
RDW: 16.4 % — ABNORMAL HIGH (ref 11.5–15.5)
WBC: 9 K/uL (ref 4.0–10.5)
nRBC: 0 % (ref 0.0–0.2)

## 2024-10-01 LAB — BASIC METABOLIC PANEL WITH GFR
Anion gap: 18 — ABNORMAL HIGH (ref 5–15)
BUN: 22 mg/dL (ref 8–23)
CO2: 17 mmol/L — ABNORMAL LOW (ref 22–32)
Calcium: 10.7 mg/dL — ABNORMAL HIGH (ref 8.9–10.3)
Chloride: 106 mmol/L (ref 98–111)
Creatinine, Ser: 1.61 mg/dL — ABNORMAL HIGH (ref 0.44–1.00)
GFR, Estimated: 31 mL/min — ABNORMAL LOW
Glucose, Bld: 81 mg/dL (ref 70–99)
Potassium: 2.9 mmol/L — ABNORMAL LOW (ref 3.5–5.1)
Sodium: 142 mmol/L (ref 135–145)

## 2024-10-01 LAB — MAGNESIUM: Magnesium: 1.7 mg/dL (ref 1.7–2.4)

## 2024-10-01 LAB — TSH: TSH: 5.67 u[IU]/mL — ABNORMAL HIGH (ref 0.350–4.500)

## 2024-10-01 LAB — PARATHYROID HORMONE, INTACT (NO CA): PTH: 18 pg/mL (ref 15–65)

## 2024-10-01 LAB — VITAMIN D 25 HYDROXY (VIT D DEFICIENCY, FRACTURES): Vit D, 25-Hydroxy: 95.8 ng/mL (ref 30–100)

## 2024-10-01 MED ORDER — K PHOS MONO-SOD PHOS DI & MONO 155-852-130 MG PO TABS: 500.0000 mg | ORAL_TABLET | Freq: Three times a day (TID) | ORAL | Status: DC

## 2024-10-01 MED ORDER — SODIUM CHLORIDE 0.9 % IV SOLN
500.0000 mg | Freq: Two times a day (BID) | INTRAVENOUS | Status: DC
  Administered 2024-10-01 – 2024-10-02 (×3): 500 mg via INTRAVENOUS
  Filled 2024-10-01 (×3): qty 10
  Filled 2024-10-01: qty 500

## 2024-10-01 MED ORDER — DILTIAZEM HCL 25 MG/5ML IV SOLN
10.0000 mg | Freq: Once | INTRAVENOUS | Status: DC
  Filled 2024-10-01: qty 5

## 2024-10-01 MED ORDER — SODIUM CHLORIDE 0.9 % IV SOLN: INTRAVENOUS | Status: AC

## 2024-10-01 MED ORDER — MAGNESIUM SULFATE 2 GM/50ML IV SOLN
2.0000 g | Freq: Once | INTRAVENOUS | Status: AC
  Administered 2024-10-01: 2 g via INTRAVENOUS
  Filled 2024-10-01: qty 50

## 2024-10-01 MED ORDER — POTASSIUM CHLORIDE CRYS ER 20 MEQ PO TBCR
40.0000 meq | EXTENDED_RELEASE_TABLET | ORAL | Status: AC
  Administered 2024-10-01 (×2): 40 meq via ORAL
  Filled 2024-10-01 (×2): qty 2

## 2024-10-01 NOTE — Plan of Care (Signed)

## 2024-10-01 NOTE — Progress Notes (Signed)
 " Progress Note   Patient: Laura Bishop FMW:[id number] DOB: February 02, 1941 DOA: 09/29/2024     2 DOS: the patient was seen and examined on 10/01/2024   Brief hospital course:  Laura Bishop is a 83 y.o. female with medical history significant of hypertension, CKD stage 3, hypothyroidism, history of lacunar infarct, atrial fibrillation on Eliquis , hypercalcemia who presents to the emergency department via EMS from home due to altered mental status.  Patient's home health nurse noted patient being unstable on her feet.  O2 sat checked was 95% on room air, EMS was activated and on arrival of EMS team, patient O2 sat dropped to 78% when she walked to the stretcher, 3 LPM of oxygen via [place] was provided with improved O2 sat to 100%.  She recently had a fall whereby she sustained fracture of surgical neck of left humerus which was repaired at [facility] and she also recently lost her son about a month ago, so she has not been eating/drinking normally since after the surgery   ED course In the emergency department, she was bradycardic.  Workup in the ED showed normocytic anemia.  BUN 30, creatinine 1.93 (baseline creatinine 1.5 -Care Everywhere).  Urinalysis was suggestive of UTI, proBNP 579, troponin 56 > 52 CT of head showed no acute intracranial hemorrhage or mass effect Patient was started on IV ceftriaxone .  IV Cardizem  5 mg x 1 was given due to paroxysmal atrial fibrillation.  IV hydration was provided    Assessment and Plan:  Acute metabolic encephalopathy Dementia Most likely related to UTI and hypercalcemia. Per patient's daughter she has had confusion for over 2 months but worse now from her baseline. Continue IV fluid hydration Hold Oscal PTH < 2 Obtain vitamin D  level as well TSH Continue IV fluid hydration Repeat calcium levels in a.m. Urine culture yields 40,000 colonies of Pseudomonas Aeruginosa.  Sensitivity pending Will switch IV Rocephin  to IV meropenem    Acute kidney injury  superimposed on stage IIIb chronic kidney disease Secondary to poor oral intake Hold losartan  Continue IV fluid hydration   Paroxysmal atrial fibrillation with intermittent bradycardia and sinus pause Metoprolol  is on hold due to bradycardia Had a transient episode of rapid ventricular rate which resolved prior to administration of IV Cardizem  Continue Eliquis  as primary prophylaxis for an acute stroke   Hypothyroidism Continue Synthroid    Elevated troponin Most likely secondary to demand ischemia Shows a downward trend    Generalized weakness Physical deconditioning PT evaluation Unclear if patient has any rehab potential due to underlying dementia     Subjective: Oriented only to person.  Not to place or time.  Had a transient episode of rapid A-fib.  Resolved without intervention  Physical Exam: Vitals:   09/30/24 2348 10/01/24 0355 10/01/24 0750 10/01/24 1037  BP: 128/72 129/65 (!) 107/56 101/62  Pulse: 76 70 75 90  Resp: 18 18 18    Temp: (!) 97.5 F (36.4 C) (!) 97.5 F (36.4 C) (!) 97.4 F (36.3 C)   TempSrc: Oral  Oral   SpO2: 90% 100% 97%   Weight:      Height:       General exam: Appears calm and comfortable. Poor dentition Respiratory system: Clear to auscultation. Respiratory effort normal. Cardiovascular system: Irregularly irregular, S1 & S2+. No rubs, gallops or clicks.  Gastrointestinal system: Abdomen is nondistended, soft and nontender. Normal bowel sounds heard. Central nervous system: Alert and awake, oriented only to person. Moves all extremities  Psychiatry: Judgement and insight appears  poor. Flat mood and affect         Data Reviewed: Labs reviewed.  Potassium 2.9, magnesium  1.7 Labs reviewed  Family Communication: Called and spoke to patient's daughter, Ms Wonda over the phone.  She has informed me that patient's husband is currently hospitalized.  All questions and concerns regarding her mother's care were discussed with her in  detail.  She verbalizes understanding and agrees with the plan.   Disposition: Status is: Inpatient Remains inpatient appropriate because: IV hydration for significant hypercalcemia  Planned Discharge Destination: TBD    Time spent: 55 minutes  Author: Aimee Somerset, MD 10/01/2024 1:00 PM  For on call review [email or web address].  "

## 2024-10-01 NOTE — Consult Note (Signed)
 "   Laura Bishop is a 83 y.o. female  [id number]  Primary Cardiologist: [facility] cardiology Reason for Consultation: Atrial fibrillation with rapid ventricular response rate  HPI: 83 year old white female with a past medical history of hypertension chronic kidney disease stage III and lacunar infarct presented to the hospital with altered mental status.  Patient apparently became short of breath and unsteady and desaturated to 78%.  On evaluation she appears to be confused and unable to give history.  I was asked to evaluate the patient because of atrial fibrillation with rapid ventricular response rate.   Review of Systems: Unable to get history from the patient and most of the history obtained from chart.   Past Medical History:  Diagnosis Date   Chronic kidney disease    chronic kidney disease stage III   Chronic lower back pain    Hyperlipidemia    Hypertension    Hypothyroidism    IDA (iron deficiency anemia)    Meningioma (HCC)    Osteoarthritis of both knees    osteoarthritis of knee   Osteoporosis    Overactive bladder    Vitamin D  deficiency     Medications Prior to Admission  Medication Sig Dispense Refill   acetaminophen  (TYLENOL ) 500 MG tablet Take by mouth.     amLODipine  (NORVASC ) 5 MG tablet Take 10 mg by mouth daily.      diphenhydramine-acetaminophen  (TYLENOL  PM) 25-500 MG TABS tablet Take 1 tablet by mouth at bedtime as needed.     ELIQUIS  2.5 MG TABS tablet Take 2.5 mg by mouth 2 (two) times daily.     levothyroxine  (SYNTHROID , LEVOTHROID) 75 MCG tablet Take 75 mcg by mouth daily before breakfast.     lidocaine  (LIDODERM ) 5 % Place 1 patch onto the skin daily.     losartan  (COZAAR ) 25 MG tablet losartan  25 mg tablet  TAKE 1 TABLET BY MOUTH EVERY DAY     metoprolol  succinate (TOPROL -XL) 50 MG 24 hr tablet Take 50 mg by mouth daily.     Multiple Vitamin (MULTIVITAMIN) tablet Take 1 tablet by mouth daily.     rosuvastatin (CRESTOR) 5 MG tablet Take 5 mg by mouth  daily.     solifenacin (VESICARE) 10 MG tablet Take 10 mg by mouth daily.     VITAMIN D  PO Take 125 mcg by mouth daily.     aspirin 81 MG tablet Take 81 mg by mouth daily.  (Patient not taking: Reported on 09/29/2024)     benzonatate  (TESSALON ) 200 MG capsule Take 1 capsule (200 mg total) by mouth 3 (three) times daily as needed. (Patient not taking: Reported on 12/15/2019) 30 capsule 0   calcium carbonate (OSCAL) 1500 (600 Ca) MG TABS tablet Take 600 mg of elemental calcium by mouth daily with breakfast.  (Patient not taking: Reported on 03/27/2020)     chlorhexidine (PERIDEX) 0.12 % solution chlorhexidine gluconate 0.12 % mouthwash  SWISH AND SPIT WITH 1 CAPSUL FOR 30 SEC 3 TIMES DAILY (Patient not taking: Reported on 09/29/2024)     ciprofloxacin (CIPRO) 500 MG tablet Take 500 mg by mouth daily. (Patient not taking: Reported on 09/29/2024)     ferrous sulfate 325 (65 FE) MG EC tablet Take 325 mg by mouth daily. (Patient not taking: Reported on 03/27/2020)     Fluocinolone Acetonide (DERMOTIC OT) Place 3 drops in ear(s) as needed. (Patient not taking: Reported on 09/29/2024)     hydrochlorothiazide (HYDRODIURIL) 12.5 MG tablet hydrochlorothiazide 12.5 mg tablet (Patient not  taking: Reported on 09/29/2024)     metaxalone (SKELAXIN) 800 MG tablet Take 800 mg by mouth as needed for muscle spasms. (Patient not taking: Reported on 03/27/2020)     oxybutynin (DITROPAN) 5 MG tablet Take 5 mg by mouth 2 (two) times daily. (Patient not taking: Reported on 09/29/2024)     pravastatin (PRAVACHOL) 80 MG tablet Take 80 mg by mouth at bedtime.  (Patient not taking: Reported on 03/27/2020)     ranitidine (ZANTAC) 75 MG tablet Take 75 mg by mouth 2 (two) times daily as needed for heartburn.  (Patient not taking: Reported on 03/27/2020)     SALONPAS PAIN RELIEVING 4 % PTCH Apply 1 patch topically as needed.  (Patient not taking: Reported on 09/29/2024)     senna-docusate (SENOKOT-S) 8.6-50 MG tablet Take 1 tablet by  mouth daily. (Patient not taking: Reported on 09/29/2024)        apixaban   2.5 mg Oral BID   diltiazem   10 mg Intravenous Once   feeding supplement  237 mL Oral BID BM   levothyroxine   75 mcg Oral Q0600   losartan   25 mg Oral Daily   potassium chloride   40 mEq Oral Q4H    Infusions:  cefTRIAXone  (ROCEPHIN )  IV 1 g (09/30/24 2132)    Allergies[1]  Social History   Socioeconomic History   Marital status: Married    Spouse name: Not on file   Number of children: Not on file   Years of education: Not on file   Highest education level: Not on file  Occupational History   Not on file  Tobacco Use   Smoking status: Never   Smokeless tobacco: Never  Vaping Use   Vaping status: Never Used  Substance and Sexual Activity   Alcohol use: No   Drug use: No   Sexual activity: Not Currently  Other Topics Concern   Not on file  Social History Narrative   Not on file   Social Drivers of Health   Tobacco Use: Low Risk (09/29/2024)   Patient History    Smoking Tobacco Use: Never    Smokeless Tobacco Use: Never    Passive Exposure: Not on file  Recent Concern: Tobacco Use - Medium Risk (09/27/2024)   Received from [facility]   Patient History    Smoking Tobacco Use: Never    Smokeless Tobacco Use: Never    Passive Exposure: Yes  Financial Resource Strain: Low Risk  (07/03/2024)   Received from [facility]   Overall Financial Resource Strain (CARDIA)    Difficulty of Paying Living Expenses: Not hard at all  Food Insecurity: Patient Unable To Answer (09/30/2024)   Epic    Worried About Programme Researcher, Broadcasting/film/video in the Last Year: Patient unable to answer    Ran Out of Food in the Last Year: Patient unable to answer  Transportation Needs: Patient Unable To Answer (09/30/2024)   Epic    Lack of Transportation (Medical): Patient unable to answer    Lack of Transportation (Non-Medical): Patient unable to answer  Physical Activity: Not on file   Stress: Not on file  Social Connections: Patient Unable To Answer (09/30/2024)   Social Connection and Isolation Panel    Frequency of Communication with Friends and Family: Patient unable to answer    Frequency of Social Gatherings with Friends and Family: Patient unable to answer    Attends Religious Services: Patient unable to answer    Active Member of Clubs  or Organizations: Patient unable to answer    Attends Club or Organization Meetings: Patient unable to answer    Marital Status: Patient unable to answer  Intimate Partner Violence: Patient Unable To Answer (09/30/2024)   Epic    Fear of Current or Ex-Partner: Patient unable to answer    Emotionally Abused: Patient unable to answer    Physically Abused: Patient unable to answer    Sexually Abused: Patient unable to answer  Depression (PHQ2-9): Not on file  Alcohol Screen: Not on file  Housing: Patient Declined (09/30/2024)   Epic    Unable to Pay for Housing in the Last Year: Patient declined    Number of Times Moved in the Last Year: Not on file    Homeless in the Last Year: Patient declined  Utilities: Patient Unable To Answer (09/30/2024)   Epic    Threatened with loss of utilities: Patient unable to answer  Health Literacy: Not on file    Family History  Problem Relation Age of Onset   Stomach cancer Mother    Lung cancer Father     PHYSICAL EXAM: Vitals:   10/01/24 0750 10/01/24 1037  BP: (!) 107/56 101/62  Pulse: 75 90  Resp: 18   Temp: (!) 97.4 F (36.3 C)   SpO2: 97%      Intake/Output Summary (Last 24 hours) at 10/01/2024 1222 Last data filed at 10/01/2024 0900 Gross per 24 hour  Intake 792.82 ml  Output --  Net 792.82 ml    General:  Well appearing. No respiratory difficulty HEENT: normal Neck: supple. no JVD. Carotids 2+ bilat; no bruits. No lymphadenopathy or thryomegaly appreciated. Cor: PMI nondisplaced. Regular rate & rhythm. No rubs, gallops or murmurs. Lungs: clear Abdomen: soft,  nontender, nondistended. No hepatosplenomegaly. No bruits or masses. Good bowel sounds. Extremities: no cyanosis, clubbing, rash, edema Neuro: alert & oriented x 3, cranial nerves grossly intact. moves all 4 extremities w/o difficulty. Affect pleasant.  ECG: Sinus rhythm 84 bpm with APCs and LVH and nonspecific ST-T changes  Results for orders placed or performed during the hospital encounter of 09/29/24 (from the past 24 hours)  CBC     Status: Abnormal   Collection Time: 10/01/24  5:29 AM  Result Value Ref Range   WBC 9.0 4.0 - 10.5 K/uL   RBC 3.21 (L) 3.87 - 5.11 MIL/uL   Hemoglobin 9.8 (L) 12.0 - 15.0 g/dL   HCT 69.4 (L) 63.9 - 53.9 %   MCV 95.0 80.0 - 100.0 fL   MCH 30.5 26.0 - 34.0 pg   MCHC 32.1 30.0 - 36.0 g/dL   RDW 83.5 (H) 88.4 - 84.4 %   Platelets 330 150 - 400 K/uL   nRBC 0.0 0.0 - 0.2 %  Basic metabolic panel     Status: Abnormal   Collection Time: 10/01/24  5:29 AM  Result Value Ref Range   Sodium 142 135 - 145 mmol/L   Potassium 2.9 (L) 3.5 - 5.1 mmol/L   Chloride 106 98 - 111 mmol/L   CO2 17 (L) 22 - 32 mmol/L   Glucose, Bld 81 70 - 99 mg/dL   BUN 22 8 - 23 mg/dL   Creatinine, Ser 8.38 (H) 0.44 - 1.00 mg/dL   Calcium 89.2 (H) 8.9 - 10.3 mg/dL   GFR, Estimated 31 (L) >60 mL/min   Anion gap 18 (H) 5 - 15  Magnesium      Status: None   Collection Time: 10/01/24 11:28 AM  Result Value  Ref Range   Magnesium  1.7 1.7 - 2.4 mg/dL   DG Chest Portable 1 View Result Date: 09/29/2024 EXAM: 1 VIEW(S) XRAY OF THE CHEST 09/29/2024 12:42:13 PM COMPARISON: 07/28/2024 CLINICAL HISTORY: hypoxia FINDINGS: LUNGS AND PLEURA: Probable atelectatic changes at left lung base. Elevated left hemidiaphragm. No pleural effusion. No pneumothorax. HEART AND MEDIASTINUM: No acute abnormality of the cardiac and mediastinal silhouettes. BONES AND SOFT TISSUES: Left reverse shoulder arthroplasty noted. IMPRESSION: 1. Probable atelectatic changes at the left lung base and elevated left  hemidiaphragm. Electronically signed by: Waddell Calk MD 09/29/2024 02:54 PM EST RP Workstation: [id number]   CT HEAD WO CONTRAST ( ) Result Date: 09/29/2024 EXAM: CT HEAD WITHOUT CONTRAST 09/29/2024 01:31:34 PM TECHNIQUE: CT of the head was performed without the administration of intravenous contrast. Automated exposure control, iterative reconstruction, and/or weight based adjustment of the mA/kV was utilized to reduce the radiation dose to as low as reasonably achievable. COMPARISON: CT head 07/28/2024 and MRI brain 06/22/2018. CLINICAL HISTORY: AMS. FINDINGS: BRAIN AND VENTRICLES: No acute hemorrhage. No evidence of acute territorial infarct. Chronic right thalamic lacunar infarction. Chronic lacunar infarction in the right extreme capsule. Overall unchanged mild-to-moderate scattered white matter hypodensities which are nonspecific but most commonly represent chronic microvascular ischemic changes. 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 Again partially empty sella, nonspecific. No hydrocephalus. No suspicious extra-axial collection. No mass effect or midline shift. ORBITS: Bilateral lens replacement. SINUSES: Again trace right mastoid effusion. No acute abnormality. SOFT TISSUES AND SKULL: No acute soft tissue abnormality. No skull fracture. IMPRESSION: 1. No acute intracranial hemorrhage or mass effect. 2. No substantial change since 07/28/2024. Electronically signed by: Prentice Spade MD 09/29/2024 01:59 PM EST RP Workstation: [id number]     ASSESSMENT AND PLAN: #1 status post atrial fibrillation with rapid ventricular response rate.  A-fib with rapid ventricular response rate was encountered briefly currently appears to be in sinus rhythm.  She briefly went into sinus bradycardia and is getting Cardizem  10 mg IV as needed.  Advise getting echocardiogram. #2 patient has UTI and is getting ceftriaxone  IV.  Patient's altered mental status may be related to UTI versus sepsis  with prior history of CVA. Gala Padovano     [1]  Allergies Allergen Reactions   Other Other (See Comments)    Contraindicated by renal insufficiency Contraindicated by renal insufficiency    Estrogens    Nsaids    "

## 2024-10-02 DIAGNOSIS — R630 Anorexia: Secondary | ICD-10-CM | POA: Diagnosis not present

## 2024-10-02 DIAGNOSIS — Z515 Encounter for palliative care: Secondary | ICD-10-CM | POA: Diagnosis not present

## 2024-10-02 DIAGNOSIS — I4891 Unspecified atrial fibrillation: Secondary | ICD-10-CM | POA: Diagnosis not present

## 2024-10-02 DIAGNOSIS — R5383 Other fatigue: Secondary | ICD-10-CM | POA: Diagnosis not present

## 2024-10-02 DIAGNOSIS — Z66 Do not resuscitate: Secondary | ICD-10-CM | POA: Diagnosis not present

## 2024-10-02 DIAGNOSIS — Z7189 Other specified counseling: Secondary | ICD-10-CM | POA: Diagnosis not present

## 2024-10-02 DIAGNOSIS — Z789 Other specified health status: Secondary | ICD-10-CM | POA: Diagnosis not present

## 2024-10-02 DIAGNOSIS — N390 Urinary tract infection, site not specified: Secondary | ICD-10-CM | POA: Diagnosis not present

## 2024-10-02 DIAGNOSIS — G9341 Metabolic encephalopathy: Secondary | ICD-10-CM | POA: Diagnosis not present

## 2024-10-02 LAB — BASIC METABOLIC PANEL WITH GFR
Anion gap: 17 — ABNORMAL HIGH (ref 5–15)
BUN: 18 mg/dL (ref 8–23)
CO2: 15 mmol/L — ABNORMAL LOW (ref 22–32)
Calcium: 10.4 mg/dL — ABNORMAL HIGH (ref 8.9–10.3)
Chloride: 109 mmol/L (ref 98–111)
Creatinine, Ser: 1.59 mg/dL — ABNORMAL HIGH (ref 0.44–1.00)
GFR, Estimated: 32 mL/min — ABNORMAL LOW
Glucose, Bld: 81 mg/dL (ref 70–99)
Potassium: 4.4 mmol/L (ref 3.5–5.1)
Sodium: 141 mmol/L (ref 135–145)

## 2024-10-02 LAB — CBC
HCT: 27.6 % — ABNORMAL LOW (ref 36.0–46.0)
Hemoglobin: 9.1 g/dL — ABNORMAL LOW (ref 12.0–15.0)
MCH: 30.7 pg (ref 26.0–34.0)
MCHC: 33 g/dL (ref 30.0–36.0)
MCV: 93.2 fL (ref 80.0–100.0)
Platelets: 282 K/uL (ref 150–400)
RBC: 2.96 MIL/uL — ABNORMAL LOW (ref 3.87–5.11)
RDW: 16.8 % — ABNORMAL HIGH (ref 11.5–15.5)
WBC: 9.4 K/uL (ref 4.0–10.5)
nRBC: 0 % (ref 0.0–0.2)

## 2024-10-02 LAB — URINE CULTURE: Culture: 40000 — AB

## 2024-10-02 LAB — C-REACTIVE PROTEIN: CRP: <3 mg/dL — ABNORMAL HIGH

## 2024-10-02 LAB — MAGNESIUM: Magnesium: 2.5 mg/dL — ABNORMAL HIGH (ref 1.7–2.4)

## 2024-10-02 MED ORDER — ENSURE PLUS HIGH PROTEIN PO LIQD
237.0000 mL | Freq: Three times a day (TID) | ORAL | Status: DC
  Administered 2024-10-02 – 2024-10-05 (×9): 237 mL via ORAL

## 2024-10-02 MED ORDER — MIRTAZAPINE 15 MG PO TABS
7.5000 mg | ORAL_TABLET | Freq: Every day | ORAL | Status: DC
  Administered 2024-10-02 – 2024-10-07 (×6): 7.5 mg via ORAL
  Filled 2024-10-02 (×6): qty 1

## 2024-10-02 MED ORDER — VITAMIN C 500 MG PO TABS
500.0000 mg | ORAL_TABLET | Freq: Two times a day (BID) | ORAL | Status: DC
  Administered 2024-10-02 – 2024-10-08 (×13): 500 mg via ORAL
  Filled 2024-10-02 (×13): qty 1

## 2024-10-02 MED ORDER — ZINC SULFATE 220 (50 ZN) MG PO CAPS
220.0000 mg | ORAL_CAPSULE | Freq: Every day | ORAL | Status: DC
  Administered 2024-10-02 – 2024-10-08 (×7): 220 mg via ORAL
  Filled 2024-10-02 (×7): qty 1

## 2024-10-02 MED ORDER — SODIUM CHLORIDE 0.9 % IV SOLN: INTRAVENOUS | Status: AC

## 2024-10-02 MED ORDER — PROSOURCE PLUS PO LIQD
30.0000 mL | Freq: Three times a day (TID) | ORAL | Status: DC
  Administered 2024-10-02 – 2024-10-05 (×9): 30 mL via ORAL

## 2024-10-02 MED ORDER — SODIUM CHLORIDE 0.9 % IV SOLN
1.0000 g | Freq: Every day | INTRAVENOUS | Status: AC
  Administered 2024-10-02 – 2024-10-05 (×4): 1 g via INTRAVENOUS
  Filled 2024-10-02 (×4): qty 1

## 2024-10-02 MED ORDER — ADULT MULTIVITAMIN W/MINERALS CH
1.0000 | ORAL_TABLET | Freq: Every day | ORAL | Status: DC
  Administered 2024-10-02 – 2024-10-08 (×6): 1 via ORAL
  Filled 2024-10-02 (×7): qty 1

## 2024-10-02 MED ORDER — METOPROLOL SUCCINATE ER 25 MG PO TB24
25.0000 mg | ORAL_TABLET | Freq: Every day | ORAL | Status: DC
  Administered 2024-10-02 – 2024-10-03 (×2): 25 mg via ORAL
  Filled 2024-10-02 (×2): qty 1

## 2024-10-02 NOTE — Progress Notes (Addendum)
 Physical Therapy Treatment Patient Details Name: Laura Bishop MRN: [id number] DOB: 09-05-1941 Today's Date: 10/02/2024   History of Present Illness Laura Bishop is a 83 y.o. female with medical history significant of hypertension, CKD stage 3, hypothyroidism, history of lacunar infarct, atrial fibrillation on Eliquis , hypercalcemia who presents to the emergency department via EMS from home due to altered mental status.  Patient's home health nurse noted patient being unstable on her feet.  O2 sat checked was 95% on room air, EMS was activated and on arrival of EMS team, patient O2 sat dropped to 78% when she walked to the stretcher, 3 LPM of oxygen via [place] was provided with improved O2 sat to 100%.  She recently had a fall whereby she sustained fracture of surgical neck of left humerus which was repaired at [facility] and she also recently lost her son about a month ago, so she has not been eating/drinking normally since after the surgery    PT Comments  Pt in bed.  Sitter reports poor sleep but she is awake and agrees to session.  She does require min a x 1 for transition to sitting.  Once sitting she stands with min a x 1 and self selects gait distance of just outside of room before turning back to room.  She is encouraged to stop in bathroom but does not void and needs assist to rise from toilet.  She is encouraged to walk further but returns to bed with min a x 1 to return to supine and max a x 2 to pull up in bed.  Pt with flat affect and does not overly engage in conversation.    Will leave discharge recommendations as is but she is at increased risk for needing additional supports upon discharge or transition to rehab/higher level of care if she does not increase mobility and participation in care.     If plan is discharge home, recommend the following: A little help with walking and/or transfers;A little help with bathing/dressing/bathroom;Help with stairs or ramp for entrance;Assist for  transportation;Assistance with feeding;Assistance with cooking/housework;Supervision due to cognitive status   Can travel by private vehicle        Equipment Recommendations       Recommendations for Other Services       Precautions / Restrictions Precautions Precautions: Fall Restrictions Weight Bearing Restrictions Per Provider Order: No     Mobility  Bed Mobility Overal bed mobility: Needs Assistance Bed Mobility: Supine to Sit, Sit to Supine     Supine to sit: Min assist, Used rails Sit to supine: Min assist, Used rails     Patient Response: Cooperative  Transfers Overall transfer level: Needs assistance Equipment used: Rolling walker (2 wheels) Transfers: Sit to/from Stand Sit to Stand: Contact guard assist, Min assist                Ambulation/Gait Ambulation/Gait assistance: Contact guard assist, Min assist Gait Distance (Feet): 20 Feet Assistive device: Rolling walker (2 wheels) Gait Pattern/deviations: Step-to pattern, Step-through pattern, Drifts right/left, Staggering right, Trunk flexed, Narrow base of support Gait velocity: decreased     General Gait Details: self limits distances today   Stairs             Wheelchair Mobility     Tilt Bed Tilt Bed Patient Response: Cooperative  Modified Rankin (Stroke Patients Only)       Balance Overall balance assessment: Needs assistance Sitting-balance support: Feet supported Sitting balance-Leahy Scale: Fair  Standing balance support: Reliant on assistive device for balance, During functional activity Standing balance-Leahy Scale: Fair                              Hotel Manager: No apparent difficulties  Cognition Arousal: Alert Behavior During Therapy: WFL for tasks assessed/performed, Agitated   PT - Cognitive impairments: History of cognitive impairments, Orientation, Awareness, Problem solving, Safety/Judgement                          Following commands: Impaired Following commands impaired: Follows one step commands inconsistently, Follows one step commands with increased time    Cueing Cueing Techniques: Tactile cues  Exercises Other Exercises Other Exercises: to bathroom but did not void    General Comments        Pertinent Vitals/Pain Pain Assessment Pain Assessment: No/denies pain    Home Living                          Prior Function            PT Goals (current goals can now be found in the care plan section) Progress towards PT goals: Progressing toward goals    Frequency    Min 2X/week      PT Plan      Co-evaluation              AM-PAC PT 6 Clicks Mobility   Outcome Measure  Help needed turning from your back to your side while in a flat bed without using bedrails?: None Help needed moving from lying on your back to sitting on the side of a flat bed without using bedrails?: A Little Help needed moving to and from a bed to a chair (including a wheelchair)?: A Little Help needed standing up from a chair using your arms (e.g., wheelchair or bedside chair)?: A Little Help needed to walk in hospital room?: A Little Help needed climbing 3-5 steps with a railing? : A Lot 6 Click Score: 18    End of Session Equipment Utilized During Treatment: Gait belt Activity Tolerance: Patient tolerated treatment well Patient left: in bed;with call bell/phone within reach;with bed alarm set;with nursing/sitter in room Nurse Communication: Mobility status PT Visit Diagnosis: Other abnormalities of gait and mobility (R26.89);Muscle weakness (generalized) (M62.81);Difficulty in walking, not elsewhere classified (R26.2);History of falling (Z91.81);Unsteadiness on feet (R26.81)     Time: 8751-8740 PT Time Calculation (min) (ACUTE ONLY): 11 min  Charges:    $Gait Training: 8-22 mins PT General Charges $$ ACUTE PT VISIT: 1 Visit                   Lauraine Gills,  PTA 10/02/2024, 1:08 PM

## 2024-10-02 NOTE — Consult Note (Cosign Needed Addendum)
 [facility] Psychiatric Consult Initial  Patient Name: .Laura Bishop  MRN: [id number]  DOB: February 04, 1941  Consult Order details:  Orders (From admission, onward)     Start     Ordered   10/02/24 1018  IP CONSULT TO PSYCHIATRY       Ordering Provider: Lanetta Lingo, MD  Provider:  Ruther Millie SAUNDERS, MD  Question Answer Comment  Location [facility]   Reason for Consult? Agitation.  History of dementia      10/02/24 1017             Mode of Visit: In person    Psychiatry Consult Evaluation  Service Date: October 02, 2024 LOS:  LOS: 3 days  Chief Complaint Aggressive behaviors secondary to dementia  Primary Psychiatric Diagnoses  Dementia   Assessment   Laura Bishop is a 83 y.o. female admitted: Medicallyfor 09/29/2024 11:51 AM for  acute metabolic encephalopathy. She carries the psychiatric diagnoses of Unknown and has a past medical history of  hypertension, CKD stage three, hypothyroidism, atrial fibrillation on Eliquis , hypercalcemia and history of lacunar infarct.   Chart review shows that husband reported to cardiology that there has been a significant decline in her cognitive status since her most recent hospitalization. Note from cardiology reports plan to look for reversible factors, such as metabolic or meds (09/27/2024).   Baseline is unclear. Husband (primary medical decision maker) is currently admitted into a hospital in [place]. And current caretaker at her side has limited information. Unable to get in contact with her daughter at the time of evaluation.  Unable to assess for orientation as patient refused to talk. Patient presented as confused, but was calm and cooperative throughout assessment. Patient did not display aggressive behaviors during assessment.   Diagnoses:  Active Hospital problems: Principal Problem:   UTI (urinary tract infection)    Plan   ## Psychiatric Medication Recommendations:  Chart review  completed. Cardiology appointment on 09/27/24 indicates mirtazapine  as a home medication for patient.  - Can consider restarting mirtazapine  (recommend starting at 7.5 mg at night) to see if helpful for aggression.    At this time, only PRN medication's can be suggested due to husband being admitted into the hospital and inability to reach daughter causing limited information/consent. Can consider:   - Hydroxyzine  10 mg as needed for agitation (low dose due to CrCl ; if used monitor QTC and for increased sedation) - If acute, severe agitation: Haldol  PRN can be considered, using lowest dose that is effective.   Due to lack of information and inability to contact family member, psychiatric interventions are limited at this time. With dementia related aggression it is recommended to try nonpharmacological interventions first. If symptoms continue and cause concern for safety, pharmacological measures can be considered, specifically antipsychotics. However, antipsychotics carry the blackbox warning of increased mortality in patient's with dementia. To initiate a scheduled antipsychotic, family member involvement would be necessary to explain the risks versus benefit and to obtain consent.   ## Medical Decision Making Capacity: Not specifically addressed in this encounter  ## Further Work-up:   -- most recent EKG on 10/02/2024 had QtC of 457 -- Pertinent labwork reviewed earlier this admission includes: CBC, Magnesium , BMP  (GFR 32), TSH (5.670), CMP   ## Disposition:-- There are no psychiatric contraindications to discharge at this time  ## Behavioral / Environmental: -Patient would benefit from more frequent contact with medical team to delineate plan of care and allow for clarification questions,  which will help alleviate anxiety regarding treatment. If possible, try to check back in with the pt in the afternoon.    ## Safety and Observation Level:  - Based on my clinical evaluation, I  estimate the patient to be at low risk of self harm in the current setting. - At this time, we recommend  routine. This decision is based on my review of the chart including patient's history and current presentation, interview of the patient, mental status examination, and consideration of suicide risk including evaluating suicidal ideation, plan, intent, suicidal or self-harm behaviors, risk factors, and protective factors. This judgment is based on our ability to directly address suicide risk, implement suicide prevention strategies, and develop a safety plan while the patient is in the clinical setting. Please contact our team if there is a concern that risk level has changed.  CSSR Risk Category:C-SSRS RISK CATEGORY: No Risk  Suicide Risk Assessment: Patient has following modifiable risk factors for suicide: N/A. Patient has following non-modifiable or demographic risk factors for suicide: Unknown Patient has the following protective factors against suicide:  unknown  Thank you for this consult request. Recommendations have been communicated to the primary team.  We will continue to consult at this time.   Glenys Shalon Salado, NP       History of Present Illness  Relevant Aspects of [facility]   Patient Report:  Due to patient's diagnosis of dementia, she is a poor historian. Information gathered from caretaker at bedside named Angie. Angie reports that she is a friend from their church, but she used to be a caretaker so she has been helping out. She reports that the patient's husband is currently admitted into the hospital in [facility] for pneumonia. The patient usually lives at home with just her husband and no other supports.  She reports having a limited medical or psychiatric history about the patient. She does know that she has a daughter, however has never seen her and does not know her name. She does not believe that she is actively involved. Her husband has been trying to do it  alone at home, however, has sustained injuries with trying to lift her and take care of her.  Angie is unsure of any past psychiatric history. She has mentioned that the patient's husband has noted increased aggression from the patient. This aggression includes yelling out and screaming. Denies any physical aggression that she knows. She identifies all the patient has been in the hospital that the patient has been trying to rip out her IV. She has also noted that it appears the patient is not sleeping well. She reports the patient often won't respond to you or talk back to you. This is her normal. She has noted that there has been a study decline in the patients dementia.   Writer attempted to talk to patient. Patient looked at clinical research associate and looked away. She would not answer any questions.  Psych ROS:  Depression:  Unknown. Anxiety:   Unknown. Mania (lifetime and current):  Unknown. Psychosis: (lifetime and current):  Unknown.  Collateral information:  Husband is currently in hospital and his primary caregiver. Daughter is listed as emergency contact. Attempted to contact. Voicemail is full.    Psychiatric and Social History  Psychiatric History:  Information collected from Chart  Prev Dx/Sx: Unknown Current Psych Provider:  unknown Home Meds (current): mirtazapine  Previous Med Trials:  unknown Therapy:  unknown  Prior Psych Hospitalization:  unknown Prior Self Harm:  unknown Prior Violence:  recent  aggression at home in the form of verbalization  Family Psych History:  unknown Family Hx suicide:  unknown  Social History:   Educational Hx:  unknown Occupational Hx:  unknown Legal Hx:  unknown Living Situation:  with husband Spiritual Hx:  friend reports patient going to church Access to weapons/lethal means:   unknown  Substance History Alcohol:  unknown Type of alcohol  unknown Last Drink  unknown Number of drinks per day  unknown History of alcohol withdrawal seizures   unknown History of DT's  unknown Tobacco:  unknown Illicit drugs:  unknown Prescription drug abuse:  unknown Rehab hx:  unknown  Exam Findings  Physical Exam: Reviewed and agree with the physical exam findings conducted by the medical provider Vital Signs:  Temp:  [97.6 F (36.4 C)-98.6 F (37 C)] 98.6 F (37 C) (12/22 1301) Pulse Rate:  [63-82] 74 (12/22 1301) Resp:  [18-20] 20 (12/22 1301) BP: (101-165)/(49-150) 121/49 (12/22 1301) SpO2:  [98 %] 98 % (12/22 1301) Blood pressure (!) 121/49, pulse 74, temperature 98.6 F (37 C), resp. rate 20, height 5' (1.524 m), weight 65.6 kg, SpO2 98%. Body mass index is 28.24 kg/m.    Mental Status Exam: General Appearance: Disheveled  Orientation:  Other:  unable to assess  Memory:  Unable to assess  Concentration:  Concentration:  unable to assess and Attention Span:  unable to assess  Recall:   unable to assess  Attention  Poor  Eye Contact:  Poor  Speech:  Garbled  Language:  Poor  Volume:  Decreased  Mood:  unable to assess  Affect:   unable to assess  Thought Process:   unable to assess  Thought Content:   unable to assess  Suicidal Thoughts:   unable to asses  Homicidal Thoughts:   unable to ass  Judgement:  Impaired  Insight:  Lacking  Psychomotor Activity:  Normal  Akathisia:  NA  Fund of Knowledge:   unable to assess      Assets:  Housing Social Support  Cognition:  Impaired,  Severe  ADL's:  Impaired  AIMS (if indicated):        Other History   These have been pulled in through the EMR, reviewed, and updated if appropriate.  Family History:  The patient's family history includes Lung cancer in her father; Stomach cancer in her mother.  Medical History: Past Medical History:  Diagnosis Date   Chronic kidney disease    chronic kidney disease stage III   Chronic lower back pain    Hyperlipidemia    Hypertension    Hypothyroidism    IDA (iron deficiency anemia)    Meningioma (HCC)    Osteoarthritis of  both knees    osteoarthritis of knee   Osteoporosis    Overactive bladder    Vitamin D  deficiency     Surgical History: Past Surgical History:  Procedure Laterality Date   ABDOMINAL HYSTERECTOMY     APPENDECTOMY     BACK SURGERY     COLONOSCOPY WITH PROPOFOL  N/A 07/20/2016   Procedure: COLONOSCOPY WITH PROPOFOL ;  Surgeon: Lamar ONEIDA Holmes, MD;  Location: [facility] ENDOSCOPY;  Service: Endoscopy;  Laterality: N/A;   COLONOSCOPY WITH PROPOFOL  N/A 02/27/2019   Procedure: COLONOSCOPY WITH PROPOFOL ;  Surgeon: Holmes Lamar ONEIDA, MD;  Location: [facility] ENDOSCOPY;  Service: Endoscopy;  Laterality: N/A;   ESOPHAGOGASTRODUODENOSCOPY (EGD) WITH PROPOFOL  N/A 07/20/2016   Procedure: ESOPHAGOGASTRODUODENOSCOPY (EGD) WITH PROPOFOL ;  Surgeon: Lamar ONEIDA Holmes, MD;  Location: [facility] ENDOSCOPY;  Service: Endoscopy;  Laterality: N/A;   ESOPHAGOGASTRODUODENOSCOPY (EGD) WITH PROPOFOL  N/A 01/29/2020   Procedure: ESOPHAGOGASTRODUODENOSCOPY (EGD) WITH PROPOFOL ;  Surgeon: Toledo, Ladell POUR, MD;  Location: [facility] ENDOSCOPY;  Service: Gastroenterology;  Laterality: N/A;   EYE SURGERY     extraction cataract   lobectomy total thyroid        Medications:  Current Medications[1]  Allergies: Allergies[2]  Glenys Beal, NP      [1]  Current Facility-Administered Medications:    (feeding supplement) PROSource Plus liquid 30 mL, 30 mL, Oral, TID BM, Agbata, Tochukwu, MD   0.9 %  sodium chloride  infusion, , Intravenous, Continuous, Agbata, Tochukwu, MD, Last Rate: 100 mL/hr at 10/02/24 1059, New Bag at 10/02/24 1059   acetaminophen  (TYLENOL ) tablet 650 mg, 650 mg, Oral, Q6H PRN **OR** acetaminophen  (TYLENOL ) suppository 650 mg, 650 mg, Rectal, Q6H PRN, Adefeso, Oladapo, DO   apixaban  (ELIQUIS ) tablet 2.5 mg, 2.5 mg, Oral, BID, Trudy Anthony HERO, MD, 2.5 mg at 10/02/24 0940   ascorbic acid  (VITAMIN C ) tablet 500 mg, 500 mg, Oral, BID, Agbata, Tochukwu, MD   cefTAZidime  (FORTAZ ) 1 g in sodium chloride  0.9 % 100 mL IVPB, 1 g,  Intravenous, Daily, Agbata, Tochukwu, MD   diltiazem  (CARDIZEM ) injection 10 mg, 10 mg, Intravenous, Once, Agbata, Tochukwu, MD   feeding supplement (ENSURE PLUS HIGH PROTEIN) liquid 237 mL, 237 mL, Oral, TID BM, Agbata, Tochukwu, MD, 237 mL at 10/02/24 1340   haloperidol  (HALDOL ) tablet 5 mg, 5 mg, Oral, Q6H PRN **OR** haloperidol  lactate (HALDOL ) injection 5 mg, 5 mg, Intramuscular, Q6H PRN, Trudy Anthony HERO, MD, 5 mg at 10/01/24 2011   levothyroxine  (SYNTHROID ) tablet 75 mcg, 75 mcg, Oral, Q0600, Trudy Anthony HERO, MD, 75 mcg at 10/02/24 9372   metoprolol  succinate (TOPROL -XL) 24 hr tablet 25 mg, 25 mg, Oral, Daily, Hudson, Caralyn, PA-C, 25 mg at 10/02/24 1055   multivitamin with minerals tablet 1 tablet, 1 tablet, Oral, Daily, Agbata, Tochukwu, MD, 1 tablet at 10/02/24 1340   ondansetron  (ZOFRAN ) tablet 4 mg, 4 mg, Oral, Q6H PRN **OR** ondansetron  (ZOFRAN ) injection 4 mg, 4 mg, Intravenous, Q6H PRN, Adefeso, Oladapo, DO   zinc  sulfate (50mg  elemental zinc ) capsule 220 mg, 220 mg, Oral, Daily, Agbata, Tochukwu, MD [2]  Allergies Allergen Reactions   Other Other (See Comments)    Contraindicated by renal insufficiency Contraindicated by renal insufficiency    Estrogens    Nsaids

## 2024-10-02 NOTE — Progress Notes (Signed)
 " Progress Note   Patient: Laura Bishop FMW:[id number] DOB: 01/10/1941 DOA: 09/29/2024     3 DOS: the patient was seen and examined on 10/02/2024   Brief hospital course:  SRINIKA DELONE is a 83 y.o. female with medical history significant of hypertension, CKD stage 3, hypothyroidism, history of lacunar infarct, atrial fibrillation on Eliquis , hypercalcemia who presents to the emergency department via EMS from home due to altered mental status.  Patient's home health nurse noted patient being unstable on her feet.  O2 sat checked was 95% on room air, EMS was activated and on arrival of EMS team, patient O2 sat dropped to 78% when she walked to the stretcher, 3 LPM of oxygen via [place] was provided with improved O2 sat to 100%.  She recently had a fall whereby she sustained fracture of surgical neck of left humerus which was repaired at [facility] and she also recently lost her son about a month ago, so she has not been eating/drinking normally since after the surgery   ED course In the emergency department, she was bradycardic.  Workup in the ED showed normocytic anemia.  BUN 30, creatinine 1.93 (baseline creatinine 1.5 -Care Everywhere).  Urinalysis was suggestive of UTI, proBNP 579, troponin 56 > 52 CT of head showed no acute intracranial hemorrhage or mass effect Patient was started on IV ceftriaxone .  IV Cardizem  5 mg x 1 was given due to paroxysmal atrial fibrillation.  IV hydration was provided      Assessment and Plan:  Acute metabolic encephalopathy Dementia Most likely related to UTI and hypercalcemia. Per patient's daughter she has had confusion for over 2 months but worse now from her baseline. Continue IV fluid hydration Hold Oscal PTH 18 Awaiting results of vitamin D  level  Continue IV fluid hydration.  Calcium levels remain elevated but show a downward trend Urine culture yields 40,000 colonies of Pseudomonas Aeruginosa and it is pan sensitive Continue IV meropenem  to complete a  5 to 7-day course of therapy Patient remains agitated and currently requires a sitter to prevent her from hurting herself. Behavioral health consult for further recommendation      Acute kidney injury superimposed on stage IIIb chronic kidney disease Secondary to poor oral intake and increased calcium levels Continue to hold losartan  Continue IV fluid hydration for the next 24 hours     Paroxysmal atrial fibrillation with intermittent bradycardia and sinus pause Appreciate cardiology input.  Recommend to resume metoprolol  at a reduced dose of 25 mg daily due to intermittent bradycardia Continue Eliquis  as primary prophylaxis for an acute stroke     Hypothyroidism Continue Synthroid      Elevated troponin Most likely secondary to demand ischemia Shows a downward trend       Generalized weakness Physical deconditioning PT evaluation Unclear if patient has any rehab potential due to underlying dementia           Subjective: Sleeping.  Was up all night.  Sitter at the bedside  Physical Exam: Vitals:   10/01/24 1922 10/02/24 0002 10/02/24 0429 10/02/24 0435  BP: (!) 114/59 116/80 (!) 165/150 101/61  Pulse: 75 82  63  Resp: 19 18 18    Temp: 98 F (36.7 C) 98.3 F (36.8 C) 97.6 F (36.4 C)   TempSrc: Oral Oral Oral   SpO2: 98%  98% 98%  Weight:      Height:       General exam: Sleeping but arouses easily Respiratory system: Clear to auscultation. Respiratory effort normal.  Cardiovascular system: Irregularly irregular, S1 & S2+. No rubs, gallops or clicks.  Gastrointestinal system: Abdomen is nondistended, soft and nontender. Normal bowel sounds heard. Central nervous system:  sleeping but arouses easily. Moves all extremities  Psychiatry: Judgement and insight appears poor. Flat mood and affect       Data Reviewed: Hemoglobin 9.1, creatinine 1.59, calcium 10.4, TSH 5.67 Labs reviewed  Family Communication: None  Disposition: Status is:  Inpatient Remains inpatient appropriate because: Receiving IV fluid hydration and antibiotics  Planned Discharge Destination: Home with Home Health    Time spent: 55 minutes  Author: Aimee Somerset, MD 10/02/2024 11:38 AM  For on call review [email or web address].  "

## 2024-10-02 NOTE — Progress Notes (Addendum)
 Initial Nutrition Assessment  DOCUMENTATION CODES:   Not applicable  INTERVENTION:   -Liberalize diet to regular for wider variety of meal selections -MVI with minerals daily -Ensure Plus High Protein po TID, each supplement provides 350 kcal and 20 grams of protein  -Magic cup TID with meals, each supplement provides 290 kcal and 9 grams of protein  -30 ml Prosource Plus TID, each supplement provides 100 kcals and 15 grams protein  -MVI with minerals daily -500 mg vitamin C  BID -220 mg zinc  sulfate daily x 14 days -RD will draw labs to rule out potential micronutrient deficiencies which may impede wound healing: vitamin A  and CRP (to best interpret results)  -Patient is at low threshold for NGT placement, however, recommend if intake does not improve secondary to mental status. Discussed recommendation and assessment findings with RN, MD, and palliative care. Recommend:   Initiate Osmolite 1.5 @ 20 ml/hr and increase by 10 ml every 12 hours to goal rate of 50 ml/hr.   60 ml Prosource TF20 daily  Tube feeding regimen provides 1880 kcal (100% of needs), 95 grams of protein, and 914 ml of H2O.    -If feedings are pursued, recommend 100 mg thiamine daily x 7 days and monitor Mg, K, and Phos and replete as needed secondary to refeeding risk   NUTRITION DIAGNOSIS:   Inadequate oral intake related to lethargy/confusion, poor appetite as evidenced by meal completion < 25%.  GOAL:   Patient will meet greater than or equal to 90% of their needs  MONITOR:   PO intake, Supplement acceptance, Diet advancement  REASON FOR ASSESSMENT:   Consult Assessment of nutrition requirement/status  ASSESSMENT:   83 y.o. female with medical history significant of hypertension, CKD stage 3, hypothyroidism, history of lacunar infarct, atrial fibrillation on Eliquis , hypercalcemia who presents due to altered mental status.  Patient admitted with UTI, generalized weakness, failure to thrive, and  deconditioning.   Reviewed I/O's: +272 ml x 24 hours and +1.1 L since admission  Per H&P, work-up in ED revealed normocytic anemia, urinalysis suggestive of UTI, and CT of head showed no acute intracranial hemorrhage or mass effect. Patient was unsteady on her feet. She underwent left humerus fracture repair at [facility] approximately one month ago. Patient also recently lost her son and has been eating drinking and less than normal.   Patient sitting up in bed at time of visit. Patient is confused and lethargic; unable to provide history. No family present at bedside. Spoke with sitter at bedside, who reports patient has been in and out this shift. She refused breakfast this morning. She has drank a few sips of Ensure with encouragement.   Reviewed weight history; patient has experienced a 10.8% weight loss over the past 2 months, which is significant for time frame. Patient with mild edema on exam; suspect this is masking further weight loss as well as fat and muscle depletions.  If mental status does not improve, may need to consider NGT placement to optimize nutrition. Case discussed with RN and MD. Patient's mental status seems to be the largest barrier for optimal nutritional intake at this time. Palliative care consult pending for goals of care discussions.   Medications reviewed and include cardizem , MVI, and 0.9% sodium chloride  infusion @ 100 ml/hr.   Labs reviewed.    NUTRITION - FOCUSED PHYSICAL EXAM:  Flowsheet Row Most Recent Value  Orbital Region No depletion  Upper Arm Region Mild depletion  Thoracic and Lumbar Region No depletion  Buccal  Region No depletion  Temple Region Mild depletion  Clavicle Bone Region No depletion  Clavicle and Acromion Bone Region No depletion  Scapular Bone Region No depletion  Dorsal Hand Mild depletion  Patellar Region No depletion  Anterior Thigh Region No depletion  Posterior Calf Region No depletion  Edema (RD Assessment) Mild  Hair Reviewed   Eyes Reviewed  Mouth Reviewed  Skin Reviewed  Nails Reviewed    Diet Order:   Diet Order             Diet regular Fluid consistency: Thin  Diet effective now                   EDUCATION NEEDS:   Not appropriate for education at this time  Skin:  Skin Assessment: Skin Integrity Issues: Skin Integrity Issues:: Stage II, Stage I Stage I: right buttocks Stage II: left buttocks  Last BM:  Unknown  Height:   Ht Readings from Last 1 Encounters:  09/29/24 5' (1.524 m)    Weight:   Wt Readings from Last 1 Encounters:  09/29/24 65.6 kg    Ideal Body Weight:  45.5 kg  BMI:  Body mass index is 28.24 kg/m.  Estimated Nutritional Needs:   Kcal:  1800-2000  Protein:  90-105 grams  Fluid:  1.8-2.0 L    Margery ORN, RD, LDN, CDCES Registered Dietitian III Certified Diabetes Care and Education Specialist If unable to reach this RD, please use RD Inpatient group chat on secure chat between hours of 8am-4 pm daily

## 2024-10-02 NOTE — Progress Notes (Signed)
" °   10/02/24 1100  Spiritual Encounters  Type of Visit Initial  Care provided to: Pt not available  Referral source Other (comment) (Spiritual Consult)  Reason for visit Routine spiritual support  OnCall Visit No  Spiritual Framework  Presenting Themes Meaning/purpose/sources of inspiration   Patient was sleeping so chaplain told the individual bedside check back later. "

## 2024-10-02 NOTE — TOC Progression Note (Signed)
 Transition of Care [facility]) - Progression Note    Patient Details  Name: Laura Bishop MRN: [id number] Date of Birth: 10-03-1941  [facility] CM/SW Contact  Victory Jackquline RAMAN, RN Phone Number: 10/02/2024, 4:33 PM  Clinical Narrative:  RNCM went to met with patient at the bedside and was unable to assess the patient. The patient was sleeping with a sitter at the bedside that said that the patient is very confused. The sitter said that the sdaughte had just left. RNCM called the patient's daughter to discuss discharge planning the to let her know what PT was recommending. Left a voicemail for [name] @ [phone]. Awaiting a call back. RNCM will continue to follow for discharge planning needs.                      Expected Discharge Plan and Services                                               Social Drivers of Health (SDOH) Interventions SDOH Screenings   Food Insecurity: Patient Unable To Answer (09/30/2024)  Housing: Patient Declined (09/30/2024)  Transportation Needs: Patient Unable To Answer (09/30/2024)  Utilities: Patient Unable To Answer (09/30/2024)  Financial Resource Strain: Low Risk  (07/03/2024)   Received from [facility]  Social Connections: Patient Unable To Answer (09/30/2024)  Tobacco Use: Low Risk (09/29/2024)  Recent Concern: Tobacco Use - Medium Risk (09/27/2024)   Received from [facility]    Readmission Risk Interventions     No data to display

## 2024-10-02 NOTE — Care Management Important Message (Signed)
 Important Message  Patient Details  Name: Laura Bishop MRN: [id number] Date of Birth: 03/14/1941   Important Message Given:  Yes - Medicare IM     Latessa Tillis W, CMA 10/02/2024, 2:36 PM

## 2024-10-02 NOTE — Consult Note (Cosign Needed Addendum)
 "                                                                                   Consultation Note Date: 10/02/2024 at 1045 Reason for consultation: Goals of care   Patient Name: Laura Bishop  DOB: 05/24/1941  MRN: [id number]  Age / Sex: 83 y.o., female  PCP: Sherial Bail, MD Referring Physician: Lanetta Lingo, MD  HPI/Patient Profile: 83 y.o. female  with past medical history significant for HTN, CKD stage III, hypothyroidism, history of lacunar infarct, paroxysmal A-fib on Eliquis , IDA, meningioma and FTT. Patient presented to ED from home  09/29/2024 c/o AMS and FTT.  EMS reported patient's home health nurse found patient walking around outside unsteadily in her pajamas.  Health nurse reported patient's SpO2 was initially in the 70s.  EMS initial on scene SpO2 was 95% but when patient walked with EMS SpO2 desatted to 78%.  Patient was placed on 3 L [place] with O2 improving to 100%.   In the ED, patient's husband reports she has not been eating or drinking for about a week, after returning home from [facility] after shoulder surgery. He also reported she had been a little more confused over the past week with decreased p.o. intake.  The husband shares that her son died 2024-06-18 of this year.  ED workup found BUN 30, creatinine 1.93, hypercalcemic at 11.9, GFR 25.  Hgb 9.8, HCT 31.3.  BMP 579, troponin 56 => 52. UA was positive for large leukocytes, proteinuria and rare bacteria.  ED vitals 119/68, heart rate 54, respiration rate 17, SpO2 95%, 97.5 F  EKG-NSR, rate 54 with no gross ST elevation or depression, no significant repolarization abnormality.  Left axis deviation, normal intervals.  No clear evidence of ischemia nor arrhythmia per EDP interpretation.  CXR showed probable atelectatic changes at left lung base and elevated left hemidiaphragm.  CT head demonstrated no acute intracranial hemorrhage or mass effect.  No substantial change since 07/28/2024 study.  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  Palliative medicine team was consulted for assistance with goals of care conversations.  Clinical Assessment and Goals of Care: Extensive chart review completed prior to meeting patient including labs, vital signs, imaging, progress notes, orders, and available advanced directive documents from current and previous encounters. I then met with patient at bedside and spoke with Laura Bishop (daughter) via phone to discuss diagnosis prognosis, GOC, EOL wishes, disposition and options.     Latest Ref Rng & Units 10/02/2024   10:07 AM 10/01/2024    5:29 AM 09/30/2024    5:22 AM  CBC  WBC 4.0 - 10.5 K/uL 9.4  9.0  9.8   Hemoglobin 12.0 - 15.0 g/dL 9.1  9.8  89.8   Hematocrit 36.0 - 46.0 % 27.6  30.5  31.9   Platelets 150 - 400 K/uL 282  330  358       Latest Ref Rng & Units 10/02/2024    7:53 AM 10/01/2024  5:29 AM 09/30/2024    5:22 AM  CMP  Glucose 70 - 99 mg/dL 81  81  77   BUN 8 - 23 mg/dL 18  22  27    Creatinine 0.44 - 1.00 mg/dL 8.40  8.38  8.33   Sodium 135 - 145 mmol/L 141  142  143   Potassium 3.5 - 5.1 mmol/L 4.4  2.9  4.2   Chloride 98 - 111 mmol/L 109  106  109   CO2 22 - 32 mmol/L 15  17  18    Calcium 8.9 - 10.3 mg/dL 89.5  89.2  88.0   Total Protein 6.5 - 8.1 g/dL   6.0   Total Bilirubin 0.0 - 1.2 mg/dL   <9.7   Alkaline Phos 38 - 126 U/L   50   AST 15 - 41 U/L   17   ALT 0 - 44 U/L   12      Ill-appearing, elderly female lying in bed with sitter at bedside.  She awakens to gentle verbal stimuli but is unable to participate in goals of care conversation due to confusion and lethargy.  She is alert to self and aware that she is in the hospital but states year is 50.  Respirations are even and unlabored.  She is in no  distress.  Patient complains of back pain and request to sit or help her change positions.  She denies having appetite and does not want to eat.  Patient was unable to provide history or answer questions appropriately due to confusion.  Following information was obtained by daughter via phone due to patient's husband being currently hospitalized at a different facility.  I introduced Palliative Medicine as specialized medical care for people living with serious illness. It focuses on providing relief from the symptoms and stress of a serious illness. The goal is to improve quality of life for both the patient and the family.  We discussed a brief life review of the patient.  Ms. Kattner resides in her home with her husband of 64 years.  They have 2 daughters and 1 son that is deceased.  Ms. Treloar worked in [employer] and also worked with the school supplier until retirement.  As far as functional and nutritional status, Laura Bishop shares that her mother has home health aides that help her with bathing and dressing.  Her husband assist her with other ADLs as well as going to bathroom and cooking.  She uses a cane to ambulate in her home.  Laura Bishop states her mother has had very poor appetite since she returned from [place] [facility] after left shoulder surgery.  We discussed patient's current illness and what it means in the larger context of patient's on-going co-morbidities.  Natural disease trajectory and expectations at EOL were discussed.  I attempted to elicit values and goals of care important to the patient.  Laura Bishop shares the most important thing to her at this time is for her mother to be of sound mind and body and would like her to get back to her previous state of mind prior to going to rehab.  The difference between aggressive medical intervention and comfort care was considered in light of the patient's goals of care.   Advance directives, concepts specific to code status and rehospitalization  were considered and discussed.  Confirms her mother is a DNR/DNI status.  Education offered regarding concept specific to human mortality and the limitations of medical interventions to prolong life when the body begins  to fail to thrive. 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  She shares she feels as if her mother has given up.  Laura Bishop is not sure her mother will be able to rehab appropriately.  Family is facing treatment option decisions, advanced directive, and anticipatory care needs.  Laura Bishop confirmed patient's husband is her mothers decision maker but at this time he is currently hospitalized.    Discussed with patient/family the importance of continued conversation with family and the medical providers regarding overall plan of care and treatment options, ensuring decisions are within the context of the patients values and GOCs.    Questions and concerns were addressed. The family was encouraged to call with questions or concerns.   Primary Decision Maker NEXT OF KIN Laura Bishop-spouse  Physical Exam Vitals reviewed.  Constitutional:      General: She is not in acute distress.    Appearance: She is ill-appearing.  HENT:     Head: Normocephalic and atraumatic.     Mouth/Throat:     Mouth: Mucous membranes are dry.  Pulmonary:     Effort: Pulmonary effort is normal. No respiratory distress.  Musculoskeletal:     Right lower leg: No edema.     Left lower leg: No edema.  Skin:    General: Skin is warm and dry.  Neurological:     Mental Status: She is lethargic, disoriented and confused.   Recommendations/Plan: Confirmed DNR/DNI  Continue current supportive interventions Allow time for outcomes PMT will continue to follow  Palliative Assessment/Data: 20%   Plan of care discussed with RN for meal suggestions from daughter.  Thank you for this consult. Palliative medicine will continue to follow and assist  holistically.   Time Total: 90 minutes  Time spent includes: Detailed review of medical records (labs, imaging, vital signs), medically appropriate exam (mental status, respiratory, cardiac, skin), discussed with treatment team, counseling and educating patient, family and staff, documenting clinical information, medication management and coordination of care.     Devere Sacks, ELNITA- [facility] Palliative Medicine Team  10/02/2024 10:45 AM  Office [phone]  Pager [phone]     Please contact Palliative Medicine Team providers via AMION for questions and concerns.   "

## 2024-10-02 NOTE — Progress Notes (Signed)
 Occupational Therapy Treatment Patient Details Name: Laura Bishop MRN: [id number] DOB: [birth year] Today's Date: 10/02/2024   History of present illness Pt is a 83 y.o. female who presents to the ED via EMS from home due to altered mental status. She recently had a fall whereby she sustained fracture of surgical neck of left humerus which was repaired at [facility] and she also recently lost her son about a month ago, so she has not been eating/drinking normally since after the surgery. PMH of hypertension, CKD stage 3, hypothyroidism, history of lacunar infarct, atrial fibrillation on Eliquis , hypercalcemia   OT comments  Pt is supine in bed on arrival. Pleasant and agreeable to OT session. She denies pain. Pt performed bed mobility with Min A, STS and ambulation ~35 ft using RW with Min A for safety and RW management assist/environment navigation. Toileting tasks performed and pt required CGA for seated peri-care and Max A for STS from low toilet height using grab bars. Pt declined further mobility and requested return to supine with +2 assist to reposition to [facility]. Edu and provided LUE PROM and AROM to L shoulder to maximize ROM and promote improved function. Pt remains a high fall risk with increased assist required for ADL performance. Pt returned to bed with all needs in place and will cont to require skilled acute OT services to maximize her safety and IND to return to PLOF.       If plan is discharge home, recommend the following:  A little help with walking and/or transfers;A lot of help with bathing/dressing/bathroom;Assistance with cooking/housework;Assist for transportation;Help with stairs or ramp for entrance   Equipment Recommendations  Other (comment) (defer)    Recommendations for Other Services      Precautions / Restrictions Precautions Precautions: Fall Restrictions Weight Bearing Restrictions Per Provider Order: No       Mobility Bed Mobility Overal bed mobility: Needs  Assistance Bed Mobility: Supine to Sit, Sit to Supine     Supine to sit: Min assist, Used rails, HOB elevated Sit to supine: Min assist, Used rails   General bed mobility comments: reaches for therapists hand to pull trunk forward to EOB; assist for BLE management to return to supine    Transfers Overall transfer level: Needs assistance Equipment used: Rolling walker (2 wheels) Transfers: Sit to/from Stand Sit to Stand: Max assist, Min assist           General transfer comment: Min A to stand from EOB then Max A to stand from toilet, Min/CGA for safety to ambulate ~35 ft using RW and cues for environment navigation     Balance Overall balance assessment: Needs assistance Sitting-balance support: Feet supported Sitting balance-Leahy Scale: Fair     Standing balance support: Reliant on assistive device for balance, During functional activity Standing balance-Leahy Scale: Fair Standing balance comment: RW                           ADL either performed or assessed with clinical judgement   ADL Overall ADL's : Needs assistance/impaired                         Toilet Transfer: Maximal assistance;Regular Toilet;Rolling walker (2 wheels);Grab bars Toilet Transfer Details (indicate cue type and reason): lift off assist Toileting- Clothing Manipulation and Hygiene: Contact guard assist;Sitting/lateral lean Toileting - Clothing Manipulation Details (indicate cue type and reason): after cont urine on toilet  Functional mobility during ADLs: Contact guard assist;Rolling walker (2 wheels)      Extremity/Trunk Assessment              Vision       Perception     Praxis     Communication Communication Communication: No apparent difficulties   Cognition Arousal: Alert Behavior During Therapy: WFL for tasks assessed/performed, Agitated                                 Following commands: Impaired Following commands impaired:  Follows one step commands inconsistently, Follows one step commands with increased time      Cueing   Cueing Techniques: Tactile cues, Verbal cues, Gestural cues  Exercises Other Exercises Other Exercises: Provided PROM to LUE/shoulder x10 reps and pt able to perform AROM to L shoulder x5 reps with friend present and edu on importance of ROM to prevent stiffness and maximize function of LUE.    Shoulder Instructions       General Comments      Pertinent Vitals/ Pain       Pain Assessment Pain Assessment: No/denies pain Pain Intervention(s): Monitored during session, Repositioned  Home Living                                          Prior Functioning/Environment              Frequency  Min 2X/week        Progress Toward Goals  OT Goals(current goals can now be found in the care plan section)  Progress towards OT goals: Progressing toward goals  Acute Rehab OT Goals Patient Stated Goal: get better OT Goal Formulation: With family Time For Goal Achievement: 10/14/24 Potential to Achieve Goals: Good  Plan      Co-evaluation                 AM-PAC OT 6 Clicks Daily Activity     Outcome Measure   Help from another person eating meals?: A Little Help from another person taking care of personal grooming?: A Little Help from another person toileting, which includes using toliet, bedpan, or urinal?: A Lot Help from another person bathing (including washing, rinsing, drying)?: A Lot Help from another person to put on and taking off regular upper body clothing?: A Lot Help from another person to put on and taking off regular lower body clothing?: A Lot 6 Click Score: 14    End of Session Equipment Utilized During Treatment: Rolling walker (2 wheels)  OT Visit Diagnosis: History of falling (Z91.81);Muscle weakness (generalized) (M62.81);Cognitive communication deficit (R41.841)   Activity Tolerance Patient tolerated treatment well    Patient Left in bed;with call bell/phone within reach;with bed alarm set;with nursing/sitter in room;with family/visitor present   Nurse Communication Mobility status        Time: 8585-8560 OT Time Calculation (min): 25 min  Charges: OT General Charges $OT Visit: 1 Visit OT Treatments $Self Care/Home Management : 8-22 mins $Therapeutic Activity: 8-22 mins  Shaeleigh Graw Chrismon, OTR/L  10/02/2024, 3:49 PM   Maccoy Haubner E Chrismon 10/02/2024, 3:47 PM

## 2024-10-02 NOTE — Progress Notes (Signed)
 [facility] CLINIC CARDIOLOGY PROGRESS NOTE   Patient ID: Laura Bishop MRN: [id number] DOB/AGE: [birth year] 83 y.o.  Admit date: 09/29/2024 Referring Physician Dr. Lanetta Primary Physician Sherial Bail, MD  Primary Cardiologist Tinnie Maiden, NP Reason for Consultation AF RVR  HPI: Laura Bishop is a 83 y.o. female with a past medical history of paroxysmal atrial fibrillation, hypertension, hyperlipidemia, bradycardia, frequent falls who presented to the ED on 09/29/2024 for AMS. Developed AF RVR yesterday. Cardiology was consulted for further evaluation.   Interval History:  -Patient seen and examined this AM, resting in hospital bed.  -Mental status waxing/waning. Denies any CP, SOB, palpitations this AM.  -HR controlled in NSR on tele.   Review of systems complete and found to be negative unless listed above   Vitals:   10/01/24 1922 10/02/24 0002 10/02/24 0429 10/02/24 0435  BP: (!) 114/59 116/80 (!) 165/150 101/61  Pulse: 75 82  63  Resp: 19 18 18    Temp: 98 F (36.7 C) 98.3 F (36.8 C) 97.6 F (36.4 C)   TempSrc: Oral Oral Oral   SpO2: 98%  98% 98%  Weight:      Height:         Intake/Output Summary (Last 24 hours) at 10/02/2024 9046 Last data filed at 10/01/2024 1900 Gross per 24 hour  Intake 272.27 ml  Output --  Net 272.27 ml     PHYSICAL EXAM General: Chronically ill appearing female, well nourished, in no acute distress. HEENT: Normocephalic and atraumatic. Neck: No JVD.  Lungs: Normal respiratory effort on room air. Clear bilaterally to auscultation. No wheezes, crackles, rhonchi.  Heart: HRRR. Normal S1 and S2 without gallops or murmurs. Radial & DP pulses 2+ bilaterally. Abdomen: Non-distended appearing.  Msk: Normal strength and tone for age. Extremities: No clubbing, cyanosis or edema.   Neuro: Alert and oriented X 3. Psych: Mood appropriate, affect congruent.    LABS: Basic Metabolic Panel: Recent Labs    09/30/24 0522  10/01/24 0529 10/01/24 1128 10/02/24 0753  NA 143 142  --  141  K 4.2 2.9*  --  4.4  CL 109 106  --  109  CO2 18* 17*  --  15*  GLUCOSE 77 81  --  81  BUN 27* 22  --  18  CREATININE 1.66* 1.61*  --  1.59*  CALCIUM 11.9* 10.7*  --  10.4*  MG 2.1  --  1.7 2.5*  PHOS 2.0*  --   --   --    Liver Function Tests: Recent Labs    09/29/24 1211 09/30/24 0522  AST 17 17  ALT 12 12  ALKPHOS 49 50  BILITOT 0.2 <0.2  PROT 6.0* 6.0*  ALBUMIN 3.5 3.5   No results for input(s): LIPASE, AMYLASE in the last 72 hours. CBC: Recent Labs    09/30/24 0522 10/01/24 0529  WBC 9.8 9.0  HGB 10.1* 9.8*  HCT 31.9* 30.5*  MCV 96.4 95.0  PLT 358 330   Cardiac Enzymes: No results for input(s): CKTOTAL, CKMB, CKMBINDEX, TROPONINIHS in the last 72 hours. BNP: No results for input(s): BNP in the last 72 hours. D-Dimer: No results for input(s): DDIMER in the last 72 hours. Hemoglobin A1C: No results for input(s): HGBA1C in the last 72 hours. Fasting Lipid Panel: No results for input(s): CHOL, HDL, LDLCALC, TRIG, CHOLHDL, LDLDIRECT in the last 72 hours. Thyroid  Function Tests: Recent Labs    10/01/24 1356  TSH 5.670*   Anemia Panel: No results for input(s):  VITAMINB12, FOLATE, FERRITIN, TIBC, IRON, RETICCTPCT in the last 72 hours.  No results found.   ECHO 07/2024: 1. Technically difficult study.  2. The left ventricle is relatively small in size with mildly increased wall thickness.  3. The left ventricular systolic function is hyperdynamic, LVEF is visually  estimated at 70%.  4. The right ventricle is normal in size, with normal systolic function.   TELEMETRY (personally reviewed): NSR rate 70s  EKG (personally reviewed): AF RVR 121 bpm  DATA reviewed by me 10/02/2024: last 24h vitals tele labs imaging I/O, hospitalist progress note  Principal Problem:   UTI (urinary tract infection)    ASSESSMENT AND PLAN: [name] is a  83 y.o. female with a past medical history of paroxysmal atrial fibrillation, hypertension, hyperlipidemia, bradycardia, frequent falls who presented to the ED on 09/29/2024 for AMS. Developed AF RVR yesterday. Cardiology was consulted for further evaluation.   # Atrial fibrillation RVR # Paroxysmal atrial fibrillation # Bradycardia # Acute metabolic encephalopathy Patient presented with AMS, suspected to be 2/2 UTI. Developed AF RVR yesterday, back in NSR now.  -Resume home metoprolol  at reduced dose of 25 mg daily.  -Continue eliquis  2.5 mg twice daily.   This patient's case was discussed and created with Dr. Florencio and he is in agreement.  Signed:  Danita Bloch, PA-C  10/02/2024, 9:53 AM [facility] Cardiology

## 2024-10-03 DIAGNOSIS — N3000 Acute cystitis without hematuria: Secondary | ICD-10-CM | POA: Diagnosis not present

## 2024-10-03 LAB — CALCITRIOL (1,25 DI-OH VIT D): Vit D, 1,25-Dihydroxy: 42.2 pg/mL (ref 24.8–81.5)

## 2024-10-03 LAB — COMPREHENSIVE METABOLIC PANEL WITH GFR
ALT: 18 U/L (ref 0–44)
AST: 25 U/L (ref 15–41)
Albumin: 3.1 g/dL — ABNORMAL LOW (ref 3.5–5.0)
Alkaline Phosphatase: 51 U/L (ref 38–126)
Anion gap: 14 (ref 5–15)
BUN: 15 mg/dL (ref 8–23)
CO2: 16 mmol/L — ABNORMAL LOW (ref 22–32)
Calcium: 9.3 mg/dL (ref 8.9–10.3)
Chloride: 111 mmol/L (ref 98–111)
Creatinine, Ser: 1.36 mg/dL — ABNORMAL HIGH (ref 0.44–1.00)
GFR, Estimated: 38 mL/min — ABNORMAL LOW
Glucose, Bld: 76 mg/dL (ref 70–99)
Potassium: 3.8 mmol/L (ref 3.5–5.1)
Sodium: 141 mmol/L (ref 135–145)
Total Bilirubin: 0.2 mg/dL (ref 0.0–1.2)
Total Protein: 5.3 g/dL — ABNORMAL LOW (ref 6.5–8.1)

## 2024-10-03 MED ORDER — ACETAMINOPHEN 325 MG PO TABS
650.0000 mg | ORAL_TABLET | Freq: Three times a day (TID) | ORAL | Status: DC
  Administered 2024-10-03 – 2024-10-08 (×15): 650 mg via ORAL
  Filled 2024-10-03 (×15): qty 2

## 2024-10-03 MED ORDER — METOPROLOL SUCCINATE ER 25 MG PO TB24
12.5000 mg | ORAL_TABLET | Freq: Every day | ORAL | Status: DC
  Administered 2024-10-04 – 2024-10-08 (×4): 12.5 mg via ORAL
  Filled 2024-10-03 (×5): qty 1

## 2024-10-03 MED ORDER — HYDROXYZINE HCL 10 MG PO TABS
10.0000 mg | ORAL_TABLET | Freq: Three times a day (TID) | ORAL | Status: DC | PRN
  Administered 2024-10-04 – 2024-10-08 (×5): 10 mg via ORAL
  Filled 2024-10-03 (×6): qty 1

## 2024-10-03 NOTE — Progress Notes (Signed)
 " Progress Note   Patient: Laura Bishop FMW:[id number] DOB: 1941/04/20 DOA: 09/29/2024     4 DOS: the patient was seen and examined on 10/03/2024   Brief hospital course:  Laura Bishop is a 83 y.o. female with medical history significant of hypertension, CKD stage 3, hypothyroidism, history of lacunar infarct, atrial fibrillation on Eliquis , hypercalcemia who presents to the emergency department via EMS from home due to altered mental status.  Patient's home health nurse noted patient being unstable on her feet.  O2 sat checked was 95% on room air, EMS was activated and on arrival of EMS team, patient O2 sat dropped to 78% when she walked to the stretcher, 3 LPM of oxygen via [place] was provided with improved O2 sat to 100%.  She recently had a fall whereby she sustained fracture of surgical neck of left humerus which was repaired at [facility] and she also recently lost her son about a month ago, so she has not been eating/drinking normally since after the surgery   ED course In the emergency department, she was bradycardic.  Workup in the ED showed normocytic anemia.  BUN 30, creatinine 1.93 (baseline creatinine 1.5 -Care Everywhere).  Urinalysis was suggestive of UTI, proBNP 579, troponin 56 > 52 CT of head showed no acute intracranial hemorrhage or mass effect Patient was started on IV ceftriaxone .  IV Cardizem  5 mg x 1 was given due to paroxysmal atrial fibrillation.  IV hydration was provided      Assessment and Plan:   Acute metabolic encephalopathy Dementia Most likely related to UTI and hypercalcemia. Per patient's daughter she has had confusion for over 2 months but worse now from her baseline. Her calcium levels have normalized with IV fluid hydration Continue to hold [place] PTH 18 Awaiting results of vitamin D  level  Urine culture yields 40,000 colonies of Pseudomonas Aeruginosa and it is pan sensitive Continue IV ceftazidime  to complete a 5 to 7-day course of therapy Patient  remains agitated and currently requires a sitter to prevent her from hurting herself. Appreciate behavioral health consult.  Will resume mirtazapine  7.5 mg at night and as needed hydroxyzine  for agitation       Acute kidney injury superimposed on stage IIIb chronic kidney disease Secondary to poor oral intake and increased calcium levels Renal function has improved with IV fluid hydration     Paroxysmal atrial fibrillation with intermittent bradycardia and sinus pause Appreciate cardiology input.   Recommend to  continue metoprolol  at a reduced dose of 12.5 mg daily due to intermittent bradycardia Continue Eliquis  as primary prophylaxis for an acute stroke     Hypothyroidism Continue Synthroid      Elevated troponin Most likely secondary to demand ischemia Shows a downward trend       Generalized weakness Physical deconditioning PT evaluation Recommend skilled nursing facility upon discharge            Subjective: No new complaints  Physical Exam: Vitals:   10/03/24 0025 10/03/24 0349 10/03/24 0810 10/03/24 1202  BP: 127/69 (!) 141/77 136/71 129/73  Pulse: 61 60 61 65  Resp: 20 18 16 16   Temp: (!) 97.4 F (36.3 C) (!) 97.4 F (36.3 C) 97.7 F (36.5 C) 97.7 F (36.5 C)  TempSrc:  Oral Oral Oral  SpO2: 99% 98% 100% 100%  Weight:      Height:       General exam: Sleeping but arouses easily, sitter at the bedside Respiratory system: Clear to auscultation. Respiratory effort normal.  Cardiovascular system: Irregularly irregular, S1 & S2+. No rubs, gallops or clicks.  Gastrointestinal system: Abdomen is nondistended, soft and nontender. Normal bowel sounds heard. Central nervous system:  sleeping but arouses easily. Moves all extremities  Psychiatry: Judgement and insight appears poor. Flat mood and affect      Data Reviewed: Creatinine 1.36, calcium 9.3 There are no new results to review at this time.  Family Communication:   Disposition: Status is:  Inpatient Remains inpatient appropriate because: Discharge planning  Planned Discharge Destination: Skilled nursing facility    Time spent: 40 minutes  Author: Aimee [place], MD 10/03/2024 1:54 PM  For on call review [email or web address].  "

## 2024-10-03 NOTE — Plan of Care (Signed)

## 2024-10-03 NOTE — Progress Notes (Signed)
 [facility] CLINIC CARDIOLOGY PROGRESS NOTE   Patient ID: Laura Bishop MRN: [id number] DOB/AGE: 1941-08-13 83 y.o.  Admit date: 09/29/2024 Referring Physician Dr. Lanetta Primary Physician Sherial Bail, MD  Primary Cardiologist Tinnie Maiden, NP Reason for Consultation AF RVR  HPI: Laura Bishop is a 83 y.o. female with a past medical history of paroxysmal atrial fibrillation, hypertension, hyperlipidemia, bradycardia, frequent falls who presented to the ED on 09/29/2024 for AMS. Developed AF RVR yesterday. Cardiology was consulted for further evaluation.   Interval History:  -Patient seen and examined this AM, resting in hospital bed with sitter at bedside.  -Mental status same as yesterday. Reports pain in her knees. Denies any CP, SOB, palpitations this AM.  -HR controlled in NSR on tele.   Review of systems complete and found to be negative unless listed above   Vitals:   10/02/24 2106 10/03/24 0025 10/03/24 0349 10/03/24 0810  BP: 110/62 127/69 (!) 141/77 136/71  Pulse: 65 61 60 61  Resp: 15 20 18 16   Temp: 97.6 F (36.4 C) (!) 97.4 F (36.3 C) (!) 97.4 F (36.3 C) 97.7 F (36.5 C)  TempSrc:   Oral Oral  SpO2: 100% 99% 98% 100%  Weight:      Height:         Intake/Output Summary (Last 24 hours) at 10/03/2024 1011 Last data filed at 10/03/2024 9349 Gross per 24 hour  Intake 2438.8 ml  Output --  Net 2438.8 ml     PHYSICAL EXAM General: Chronically ill appearing female, well nourished, in no acute distress. HEENT: Normocephalic and atraumatic. Neck: No JVD.  Lungs: Normal respiratory effort on room air. Clear bilaterally to auscultation. No wheezes, crackles, rhonchi.  Heart: HRRR. Normal S1 and S2 without gallops or murmurs. Radial & DP pulses 2+ bilaterally. Abdomen: Non-distended appearing.  Msk: Normal strength and tone for age. Extremities: No clubbing, cyanosis or edema.   Neuro: Alert and oriented X 3. Psych: Mood appropriate, affect  congruent.    LABS: Basic Metabolic Panel: Recent Labs    10/01/24 1128 10/02/24 0753 10/03/24 0541  NA  --  141 141  K  --  4.4 3.8  CL  --  109 111  CO2  --  15* 16*  GLUCOSE  --  81 76  BUN  --  18 15  CREATININE  --  1.59* 1.36*  CALCIUM  --  10.4* 9.3  MG 1.7 2.5*  --    Liver Function Tests: Recent Labs    10/03/24 0541  AST 25  ALT 18  ALKPHOS 51  BILITOT 0.2  PROT 5.3*  ALBUMIN 3.1*   No results for input(s): LIPASE, AMYLASE in the last 72 hours. CBC: Recent Labs    10/01/24 0529 10/02/24 1007  WBC 9.0 9.4  HGB 9.8* 9.1*  HCT 30.5* 27.6*  MCV 95.0 93.2  PLT 330 282   Cardiac Enzymes: No results for input(s): CKTOTAL, CKMB, CKMBINDEX, TROPONINIHS in the last 72 hours. BNP: No results for input(s): BNP in the last 72 hours. D-Dimer: No results for input(s): DDIMER in the last 72 hours. Hemoglobin A1C: No results for input(s): HGBA1C in the last 72 hours. Fasting Lipid Panel: No results for input(s): CHOL, HDL, LDLCALC, TRIG, CHOLHDL, LDLDIRECT in the last 72 hours. Thyroid  Function Tests: Recent Labs    10/01/24 1356  TSH 5.670*   Anemia Panel: No results for input(s): VITAMINB12, FOLATE, FERRITIN, TIBC, IRON, RETICCTPCT in the last 72 hours.  No results found.  ECHO 07/2024: 1. Technically difficult study.  2. The left ventricle is relatively small in size with mildly increased wall thickness.  3. The left ventricular systolic function is hyperdynamic, LVEF is visually  estimated at 70%.  4. The right ventricle is normal in size, with normal systolic function.   TELEMETRY (personally reviewed): NSR rate 70s  EKG (personally reviewed): AF RVR 121 bpm  DATA reviewed by me 10/03/2024: last 24h vitals tele labs imaging I/O, hospitalist progress note  Principal Problem:   UTI (urinary tract infection)    ASSESSMENT AND PLAN: Laura Bishop is a 83 y.o. female with a past medical history  of paroxysmal atrial fibrillation, hypertension, hyperlipidemia, bradycardia, frequent falls who presented to the ED on 09/29/2024 for AMS. Developed AF RVR yesterday. Cardiology was consulted for further evaluation.   # Atrial fibrillation RVR # Paroxysmal atrial fibrillation # Bradycardia # Acute metabolic encephalopathy Patient presented with AMS, suspected to be 2/2 UTI. Developed AF RVR yesterday, back in NSR now.  -Reduce metoprolol  succinate to 12.5 mg daily.  -Continue eliquis  2.5 mg twice daily.   Cardiology will sign off. Please haiku with questions or re-engage if needed. Follow up with Tinnie Maiden, NP in 1-2 weeks.   This patient's case was discussed and created with Dr. Florencio and he is in agreement.  Signed:  Danita Bloch, PA-C  10/03/2024, 10:11 AM [facility]

## 2024-10-03 NOTE — NC FL2 (Signed)
 " [place]  MEDICAID [id number] LEVEL OF CARE FORM     IDENTIFICATION  Patient Name: Laura Bishop Birthdate: 09-27-1941 Sex: female Admission Date (Current Location): 09/29/2024  [facility] and [place] Number:  Chiropodist and Address:  [facility], [address] [place], [place] [address]      Provider Number: [id number]  Attending Physician Name and Address:  Lanetta Lingo, MD  Relative Name and Phone Number:       Current Level of Care: Hospital Recommended Level of Care: Skilled Nursing Facility Prior Approval Number:    Date Approved/Denied:   PASRR Number: [id number]  Discharge Plan: SNF    Current Diagnoses: Patient Active Problem List   Diagnosis Date Noted   UTI (urinary tract infection) 09/29/2024    Orientation RESPIRATION BLADDER Height & Weight     Self, Place  Normal Incontinent Weight: 144 lb 10 oz (65.6 kg) Height:  5' (152.4 cm)  BEHAVIORAL SYMPTOMS/MOOD NEUROLOGICAL BOWEL NUTRITION STATUS   (None)   Continent Diet (Regular)  AMBULATORY STATUS COMMUNICATION OF NEEDS Skin   Limited Assist Verbally Bruising, Other (Comment), PU Stage and Appropriate Care (Erythema/redness.) PU Stage 1 Dressing:  (Right buttocks: Foam.) PU Stage 2 Dressing:  (Left buttocks: Foam.)                   Personal Care Assistance Level of Assistance  Bathing, Feeding, Dressing Bathing Assistance: Maximum assistance Feeding assistance: Limited assistance Dressing Assistance: Maximum assistance     Functional Limitations Info  Sight, Hearing, Speech Sight Info: Adequate Hearing Info: Adequate Speech Info: Adequate    SPECIAL CARE FACTORS FREQUENCY  PT (By licensed PT), OT (By licensed OT)     PT Frequency: 5 x week OT Frequency: 5 x week            Contractures Contractures Info: Not present    Additional Factors Info  Code Status, Allergies Code Status Info: DNR Allergies Info: Estrogens, Nsaids            Current Medications (10/03/2024):  This is the current hospital active medication list Current Facility-Administered Medications  Medication Dose Route Frequency Provider Last Rate Last Admin   (feeding supplement) PROSource Plus liquid 30 mL  30 mL Oral TID BM Agbata, Tochukwu, MD   30 mL at 10/03/24 1444   acetaminophen  (TYLENOL ) tablet 650 mg  650 mg Oral Q6H PRN Adefeso, Oladapo, DO       Or   acetaminophen  (TYLENOL ) suppository 650 mg  650 mg Rectal Q6H PRN Adefeso, Oladapo, DO       apixaban  (ELIQUIS ) tablet 2.5 mg  2.5 mg Oral BID Trudy Anthony HERO, MD   2.5 mg at 10/03/24 9040   ascorbic acid  (VITAMIN C ) tablet 500 mg  500 mg Oral BID Agbata, Tochukwu, MD   500 mg at 10/03/24 9040   cefTAZidime  (FORTAZ ) 1 g in sodium chloride  0.9 % 100 mL IVPB  1 g Intravenous Daily Agbata, Tochukwu, MD 200 mL/hr at 10/03/24 1013 1 g at 10/03/24 1013   diltiazem  (CARDIZEM ) injection 10 mg  10 mg Intravenous Once Agbata, Tochukwu, MD       feeding supplement (ENSURE PLUS HIGH PROTEIN) liquid 237 mL  237 mL Oral TID BM Agbata, Tochukwu, MD   237 mL at 10/03/24 1445   haloperidol  (HALDOL ) tablet 5 mg  5 mg Oral Q6H PRN Trudy Anthony HERO, MD       Or   haloperidol  lactate (HALDOL ) injection 5 mg  5 mg Intramuscular Q6H PRN Williams, Jamiese M, MD   5 mg at 10/01/24 2011   hydrOXYzine  (ATARAX ) tablet 10 mg  10 mg Oral TID PRN Lanetta Lingo, MD       levothyroxine  (SYNTHROID ) tablet 75 mcg  75 mcg Oral Q0600 Trudy Anthony HERO, MD   75 mcg at 10/03/24 0700   [START ON 10/04/2024] metoprolol  succinate (TOPROL -XL) 24 hr tablet 12.5 mg  12.5 mg Oral Daily Hudson, Caralyn, PA-C       mirtazapine  (REMERON ) tablet 7.5 mg  7.5 mg Oral QHS Agbata, Tochukwu, MD   7.5 mg at 10/02/24 2133   multivitamin with minerals tablet 1 tablet  1 tablet Oral Daily Agbata, Tochukwu, MD   1 tablet at 10/02/24 1340   ondansetron  (ZOFRAN ) tablet 4 mg  4 mg Oral Q6H PRN Adefeso, Oladapo, DO       Or   ondansetron  (ZOFRAN )  injection 4 mg  4 mg Intravenous Q6H PRN Adefeso, Oladapo, DO       zinc  sulfate (50mg  elemental zinc ) capsule 220 mg  220 mg Oral Daily Agbata, Tochukwu, MD   220 mg at 10/03/24 9040     Discharge Medications: Please see discharge summary for a list of discharge medications.  Relevant Imaging Results:  Relevant Lab Results:   Additional Information SS#: [id number]  Lauraine JAYSON Carpen, LCSW     "

## 2024-10-03 NOTE — Progress Notes (Signed)
 "                                                                                                                                                                                               Palliative Care Progress Note, Assessment & Plan   Patient Name: Laura Bishop       Date: 10/03/2024 DOB: Oct 01, 1941  Age: 83 y.o. MRN#: [id number] Attending Physician: Lanetta Lingo, MD Primary Care Physician: Sherial Bail, MD Admit Date: 09/29/2024  Subjective: Patient is lying in bed in no apparent distress.  She is awake, alert, acknowledges my presence.  She is able to make her basic wishes known.  Her daughter and patient's daughter are present at bedside during my visit.  HPI: 83 y.o. female  with past medical history significant for HTN, CKD stage III, hypothyroidism, history of lacunar infarct, paroxysmal A-fib on Eliquis , IDA, meningioma and FTT. Patient presented to ED from home  09/29/2024 c/o AMS and FTT.  EMS reported patient's home health nurse found patient walking around outside unsteadily in her pajamas.  Health nurse reported patient's SpO2 was initially in the 70s.  EMS initial on scene SpO2 was 95% but when patient walked with EMS SpO2 desatted to 78%.  Patient was placed on 3 L [place] with O2 improving to 100%.    In the ED, patient's husband reports she has not been eating or drinking for about a week, after returning home from [facility] after shoulder surgery. He also reported she had been a little more confused over the past week with decreased p.o. intake.  The husband shares that her son died 06/14/24 of this year.   ED workup found BUN 30, creatinine 1.93, hypercalcemic at 11.9, GFR 25.  Hgb 9.8, HCT 31.3.  BMP 579, troponin 56 => 52. UA was positive for large leukocytes, proteinuria and rare bacteria.   ED vitals 119/68, heart rate 54, respiration rate 17, SpO2 95%, 97.5 F   EKG-NSR, rate 54 with no gross ST elevation or depression, no significant repolarization  abnormality.  Left axis deviation, normal intervals.  No clear evidence of ischemia nor arrhythmia per EDP interpretation.   CXR showed probable atelectatic changes at left lung base and elevated left hemidiaphragm.   CT head demonstrated no acute intracranial hemorrhage or mass effect.  No substantial change since 07/28/2024 study.   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   Palliative medicine team was  consulted for assistance with goals of care conversations.  Summary of counseling/coordination of care: Extensive chart review completed prior to meeting patient including labs, vital signs, imaging, progress notes, orders, and available advanced directive documents from current and previous encounters.   After reviewing the patient's chart and assessing the patient at bedside, I spoke with patient and family at bedside in regards to symptom management and goals of care.   Just prior to my visit, patient had requested to sit up to get her breakfast.  However, she did not wish to transfer out of bed to the chair.  She requested to eat her meal in the bed.  She took a few bites of her tray before she said she was done.  Family shares that she is still confused though coming around a little bit.  I was able to complete a basic symptom assessment with patient.  She endorses pain in her low back that is mild and nonradiating.  She requested Tylenol .  Upon review of MAR, Tylenol  is available as needed.  I will schedule Tylenol  3 times daily.  Family appreciative and in agreement with adjustment.  Patient complained of not being high enough in the bed.  Adjusted patient in bed and she endorsed significant improvement in comfort level.  However, she is unable to make many vocalizations  beyond yes/no.  Discussed that watchful waiting and time for outcomes continued.  Family remains in agreement that they wish to give the patient more time to be less confused prior to transferring to rehab.  During our discussion, TOC doing our conversation and gave family list of options for potential SNF's.  Family plans to review them and get back to [facility] with choice.  TOC following closely for discharge planning.  No change to plan of care at this time.  Plan to follow-up with patient tomorrow to see if confusion has improved and if patient is able to participate in goals of care/medical decision making independently.  Physical Exam Vitals reviewed.  Constitutional:      General: She is not in acute distress. HENT:     Head: Normocephalic.     Nose: Nose normal.     Mouth/Throat:     Mouth: Mucous membranes are moist.  Eyes:     Pupils: Pupils are equal, round, and reactive to light.  Pulmonary:     Effort: Pulmonary effort is normal.  Abdominal:     Palpations: Abdomen is soft.  Skin:    General: Skin is warm and dry.  Neurological:     Mental Status: She is alert.     Comments: Oriented to self  Psychiatric:        Mood and Affect: Mood normal.        Behavior: Behavior normal.        Thought Content: Thought content normal.              Recommendations:   DNR limited remains as per previous discussions Time for outcomes Continue current plan of care  I personally spent a total of 25 minutes in the care of the patient today including preparing to see the patient, getting/reviewing separately obtained history, performing a medically appropriate exam/evaluation, and documenting clinical information in the EHR.   Lamarr L. Arvid, DNP, FNP-BC Palliative Medicine Team   "

## 2024-10-03 NOTE — Plan of Care (Signed)
  Problem: Clinical Measurements: Goal: Ability to maintain clinical measurements within normal limits will improve Outcome: Progressing   Problem: Elimination: Goal: Will not experience complications related to bowel motility Outcome: Progressing   Problem: Coping: Goal: Level of anxiety will decrease Outcome: Progressing   Problem: Pain Managment: Goal: General experience of comfort will improve and/or be controlled Outcome: Progressing   Problem: Safety: Goal: Ability to remain free from injury will improve Outcome: Progressing   Problem: Skin Integrity: Goal: Risk for impaired skin integrity will decrease Outcome: Progressing

## 2024-10-03 NOTE — Progress Notes (Signed)
 Physical Therapy Treatment Patient Details Name: Laura Bishop MRN: [id number] DOB: 09/30/1941 Today's Date: 10/03/2024   History of Present Illness Pt is a 83 y.o. female who presents to the ED via EMS from home due to altered mental status. She recently had a fall whereby she sustained fracture of surgical neck of left humerus which was repaired at [facility] and she also recently lost her son about a month ago, so she has not been eating/drinking normally since after the surgery. PMH of hypertension, CKD stage 3, hypothyroidism, history of lacunar infarct, atrial fibrillation on Eliquis , hypercalcemia    PT Comments  Pt in bed.  Awakens to voice.  She is able to get to EOB with min a x 1 with max encouragement and physical cues for hand placements.  She stand with min a x 1 and when asked stated she needs to void.  She walks to/from bathroom with RW and min a x 1 with increased assist to push walker and cues to step up into walker.  She needs max encouragement to stay up in chair for lunch.  She asks x 2 to get back to bed but with education and encouragement to stay up and she does not ask again to get back to bed.  Sitter in room and attentive to needs.  After discussion with MD, husband is primary caregiver who is in the hospital himself.  She is showing progressive weakness and decreased mobility and increased assist with activity.  She needs encouragement for OOB and activity.  Pt would be at high risk for falls and further decline without strong family support at home and unsafe to be left alone at home.  Decision made to change recommendations to SNF at this time.   If plan is discharge home, recommend the following: A little help with walking and/or transfers;A little help with bathing/dressing/bathroom;Help with stairs or ramp for entrance;Assist for transportation;Assistance with feeding;Assistance with cooking/housework;Supervision due to cognitive status   Can travel by private vehicle         Equipment Recommendations  Rolling walker (2 wheels);BSC/3in1    Recommendations for Other Services       Precautions / Restrictions Precautions Precautions: Fall Restrictions Weight Bearing Restrictions Per Provider Order: No     Mobility  Bed Mobility Overal bed mobility: Needs Assistance Bed Mobility: Supine to Sit     Supine to sit: Min assist, Used rails     General bed mobility comments: encouragement and cues for hand placements Patient Response: Flat affect  Transfers Overall transfer level: Needs assistance Equipment used: Rolling walker (2 wheels) Transfers: Sit to/from Stand Sit to Stand: Min assist, Mod assist           General transfer comment: increased assist from toilet due to low height.  much improved from bed.    Ambulation/Gait Ambulation/Gait assistance: Min assist Gait Distance (Feet): 15 Feet Assistive device: Rolling walker (2 wheels) Gait Pattern/deviations: Step-to pattern, Step-through pattern, Drifts right/left, Staggering right, Trunk flexed, Narrow base of support Gait velocity: decreased     General Gait Details: 15' x 2 to and from bathroom.  increased assist today to navigate with walker   Stairs             Wheelchair Mobility     Tilt Bed Tilt Bed Patient Response: Flat affect  Modified Rankin (Stroke Patients Only)       Balance Overall balance assessment: Needs assistance Sitting-balance support: Feet supported Sitting balance-Leahy Scale: Fair  Standing balance support: Reliant on assistive device for balance, During functional activity Standing balance-Leahy Scale: Fair Standing balance comment: RW                            Communication Communication Communication: No apparent difficulties  Cognition Arousal: Alert Behavior During Therapy: WFL for tasks assessed/performed   PT - Cognitive impairments: History of cognitive impairments, Orientation, Awareness, Problem  solving, Safety/Judgement                       PT - Cognition Comments: mod encoruagement to participate and max encouragement to stay OOB for lunch.  generally flat and does not initiate conversation or eye contact. Following commands: Impaired Following commands impaired: Follows one step commands inconsistently, Follows one step commands with increased time    Cueing Cueing Techniques: Tactile cues, Verbal cues, Gestural cues  Exercises Other Exercises Other Exercises: to bahtroom to void    General Comments        Pertinent Vitals/Pain Pain Assessment Pain Assessment: No/denies pain    Home Living                          Prior Function            PT Goals (current goals can now be found in the care plan section) Progress towards PT goals: Progressing toward goals    Frequency    Min 2X/week      PT Plan      Co-evaluation              AM-PAC PT 6 Clicks Mobility   Outcome Measure  Help needed turning from your back to your side while in a flat bed without using bedrails?: None Help needed moving from lying on your back to sitting on the side of a flat bed without using bedrails?: A Little Help needed moving to and from a bed to a chair (including a wheelchair)?: A Little Help needed standing up from a chair using your arms (e.g., wheelchair or bedside chair)?: A Little Help needed to walk in hospital room?: A Lot Help needed climbing 3-5 steps with a railing? : A Lot 6 Click Score: 17    End of Session Equipment Utilized During Treatment: Gait belt Activity Tolerance: Patient limited by fatigue;Other (comment) (generally flat affect) Patient left: in chair;with nursing/sitter in room Nurse Communication: Mobility status PT Visit Diagnosis: Other abnormalities of gait and mobility (R26.89);Muscle weakness (generalized) (M62.81);Difficulty in walking, not elsewhere classified (R26.2);History of falling (Z91.81);Unsteadiness on  feet (R26.81)     Time: 1145-1200 PT Time Calculation (min) (ACUTE ONLY): 15 min  Charges:    $Gait Training: 8-22 mins PT General Charges $$ ACUTE PT VISIT: 1 Visit                   Lauraine Gills, PTA 10/03/2024, 12:47 PM

## 2024-10-03 NOTE — TOC Progression Note (Signed)
 Transition of Care [facility]) - Progression Note    Patient Details  Name: Laura Bishop MRN: [id number] Date of Birth: 05/01/1941  [facility] CM/SW Contact  Nathanael CHRISTELLA Ring, RN Phone Number: 10/03/2024, 3:18 PM  Clinical Narrative:    CM spoke with patient's daughter, [name], introduced self.  Informed her that the PT recommendations have changed and are now recommending SNF.  Daughter is okay with this and thinks she does need STR.  SNF workup started.  List of facilities with Medicare ratings given to her sister at the bedside for them to review.  Bed search started.    Expected Discharge Plan: Skilled Nursing Facility Barriers to Discharge: Continued Medical Work up               Expected Discharge Plan and Services     Post Acute Care Choice: Skilled Nursing Facility                                         Social Drivers of Health (SDOH) Interventions SDOH Screenings   Food Insecurity: Patient Unable To Answer (09/30/2024)  Housing: Patient Declined (09/30/2024)  Transportation Needs: Patient Unable To Answer (09/30/2024)  Utilities: Patient Unable To Answer (09/30/2024)  Financial Resource Strain: Low Risk  (07/03/2024)   Received from [facility]  Social Connections: Patient Unable To Answer (09/30/2024)  Tobacco Use: Low Risk (09/29/2024)  Recent Concern: Tobacco Use - Medium Risk (09/27/2024)   Received from [facility]    Readmission Risk Interventions     No data to display

## 2024-10-04 DIAGNOSIS — N3000 Acute cystitis without hematuria: Secondary | ICD-10-CM | POA: Diagnosis not present

## 2024-10-04 LAB — VITAMIN A: Vitamin A (Retinoic Acid): 54.5 ug/dL (ref 22.0–69.5)

## 2024-10-04 MED ORDER — FLUCONAZOLE 50 MG PO TABS
150.0000 mg | ORAL_TABLET | Freq: Once | ORAL | Status: AC
  Administered 2024-10-04: 150 mg via ORAL
  Filled 2024-10-04: qty 1

## 2024-10-04 NOTE — Progress Notes (Signed)
 Occupational Therapy Treatment Patient Details Name: Laura Bishop MRN: [id number] DOB: 08-20-1941 Today's Date: 10/04/2024   History of present illness Pt is a 83 y.o. female who presents to the ED via EMS from home due to altered mental status. She recently had a fall whereby she sustained fracture of surgical neck of left humerus which was repaired at [facility] and she also recently lost her son about a month ago, so she has not been eating/drinking normally since after the surgery. PMH of hypertension, CKD stage 3, hypothyroidism, history of lacunar infarct, atrial fibrillation on Eliquis , hypercalcemia   OT comments  Pt received in bed with sitter present, pt alert to self and speaks minimally. Agreeable to transfer to chair for lunch, requires MIN A with step-by-step cues for bed mobility, MIN A for STS transfers using RW, and MOD A to transfer to recliner, increased assist for RW mgmt and cues for overall sequencing. Pt unable to get settled comfortably in recliner with c/o of cold and is assisted back to bed. Pt high falls risk due to impaired cognition, decreased balance and poor situational awareness. Pt would benefit from skilled OT services to address noted impairments and functional limitations (see below for any additional details) in order to maximize safety and independence while minimizing falls risk and caregiver burden. Anticipate the need for follow up OT services upon acute hospital DC. Patient will benefit from continued inpatient follow up therapy, <3 hours/day       If plan is discharge home, recommend the following:  A lot of help with bathing/dressing/bathroom;Assistance with cooking/housework;Assist for transportation;Help with stairs or ramp for entrance;A lot of help with walking and/or transfers;Direct supervision/assist for medications management;Direct supervision/assist for financial management   Equipment Recommendations  Other (comment)    Recommendations for Other  Services      Precautions / Restrictions Precautions Precautions: Fall Recall of Precautions/Restrictions: Impaired Restrictions Weight Bearing Restrictions Per Provider Order: No       Mobility Bed Mobility Overal bed mobility: Needs Assistance Bed Mobility: Supine to Sit, Sit to Supine     Supine to sit: Min assist, Used rails Sit to supine: Used rails, Max assist   General bed mobility comments: encouragement and cues for hand placements. requires assist for trunk and BLE for return to supine    Transfers Overall transfer level: Needs assistance Equipment used: Rolling walker (2 wheels) Transfers: Sit to/from Stand, Bed to chair/wheelchair/BSC Sit to Stand: Mod assist     Step pivot transfers: Mod assist     General transfer comment: pt requires cues for hand placement, decreased ability to process cues given hx of dementia. pt able to transfer to recliner with step-by-step cues for sequencing, assist for RW mgmt. Pt is settled in chair, and immediately reports feeling cold requesting to return back to bed, unable to convince pt to stay upright for lunch     Balance Overall balance assessment: Needs assistance Sitting-balance support: Feet supported Sitting balance-Leahy Scale: Fair     Standing balance support: Reliant on assistive device for balance, During functional activity Standing balance-Leahy Scale: Fair                             ADL either performed or assessed with clinical judgement   ADL Overall ADL's : Needs assistance/impaired  General ADL Comments: Pt agreeable to session with max encourgement, sitter present in room. Session focused on fucntional mobility    Extremity/Trunk Assessment Upper Extremity Assessment Upper Extremity Assessment: LUE deficits/detail LUE Deficits / Details: per [facility] f/u note, pt can use arm ad lib            Communication  Communication Communication: No apparent difficulties   Cognition Arousal: Alert Behavior During Therapy: WFL for tasks assessed/performed Cognition: History of cognitive impairments, Cognition impaired             OT - Cognition Comments: pt alert to self, can make some desires known, non-conversational                 Following commands: Impaired Following commands impaired: Follows one step commands inconsistently, Follows one step commands with increased time      Cueing   Cueing Techniques: Tactile cues, Verbal cues, Gestural cues        General Comments NT in room as sitter, applied new foam dressing to pressure wound on buttock    Pertinent Vitals/ Pain       Pain Assessment Pain Assessment: PAINAD Breathing: normal Negative Vocalization: none Facial Expression: smiling or inexpressive Body Language: relaxed Consolability: no need to console PAINAD Score: 0 Pain Intervention(s): Repositioned         Frequency  Min 2X/week        Progress Toward Goals  OT Goals(current goals can now be found in the care plan section)  Progress towards OT goals: Progressing toward goals  Acute Rehab OT Goals OT Goal Formulation: With family Time For Goal Achievement: 10/14/24 Potential to Achieve Goals: Good ADL Goals Pt Will Perform Upper Body Dressing: with set-up  Plan      Co-evaluation                 AM-PAC OT 6 Clicks Daily Activity     Outcome Measure   Help from another person eating meals?: A Little Help from another person taking care of personal grooming?: A Little Help from another person toileting, which includes using toliet, bedpan, or urinal?: A Lot Help from another person bathing (including washing, rinsing, drying)?: A Lot Help from another person to put on and taking off regular upper body clothing?: A Lot Help from another person to put on and taking off regular lower body clothing?: A Lot 6 Click Score: 14    End of  Session Equipment Utilized During Treatment: Gait belt;Rolling walker (2 wheels)  OT Visit Diagnosis: History of falling (Z91.81);Muscle weakness (generalized) (M62.81);Cognitive communication deficit (R41.841)   Activity Tolerance Patient tolerated treatment well   Patient Left in bed;with call bell/phone within reach;with bed alarm set;with nursing/sitter in room   Nurse Communication Mobility status        Time: 8862-8845 OT Time Calculation (min): 17 min  Charges: OT General Charges $OT Visit: 1 Visit OT Treatments $Therapeutic Activity: 8-22 mins  Kaylei Frink L. Calvary Difranco, OTR/L  10/04/2024, 2:43 PM

## 2024-10-04 NOTE — Progress Notes (Signed)
 Mobility Specialist - Progress Note      10/04/24 1400  Pain Assessment  Pain Assessment PAINAD  Breathing 0  Negative Vocalization 0  Facial Expression 0  Body Language 0  Consolability 0  PAINAD Score 0  Pain Intervention(s) Repositioned  Mobility  Activity Stood at bedside;Ambulated with assistance (Simultaneous filing. User may not have seen previous data.)  Level of Assistance Minimal assist, patient does 75% or more  Assistive Device Front wheel walker  Distance Ambulated (ft) 5 ft  Range of Motion/Exercises Active  Activity Response Tolerated well  Mobility Referral Yes  Mobility visit 1 Mobility   Pt resting in bed on RA upon entry. Pt confused but agreeable to participate to end of bed but quickly returned due to BM. Pt returned to bed and cleaned up. Bandage reapplied and RN notified of wound on bottom.   Laura Bishop Mobility Specialist 10/04/2024, 2:42 PM

## 2024-10-04 NOTE — Plan of Care (Signed)
  Problem: Clinical Measurements: Goal: Respiratory complications will improve Outcome: Progressing Goal: Cardiovascular complication will be avoided Outcome: Progressing   Problem: Nutrition: Goal: Adequate nutrition will be maintained Outcome: Progressing   Problem: Coping: Goal: Level of anxiety will decrease Outcome: Not Progressing

## 2024-10-04 NOTE — Progress Notes (Signed)
 Nutrition Follow-up  DOCUMENTATION CODES:   Not applicable  INTERVENTION:   -Downgrade diet to dysphagia 2 for ease of intake -Feeding assistance with meals -Continue MVI with minerals daily -Continue Ensure Plus High Protein po TID, each supplement provides 350 kcal and 20 grams of protein  -Continue Magic cup TID with meals, each supplement provides 290 kcal and 9 grams of protein  -Continue 30 ml Prosource Plus TID, each supplement provides 100 kcals and 15 grams protein  -Continue MVI with minerals daily -Continue 500 mg vitamin C  BID -Continue 220 mg zinc  sulfate daily x 14 days -RD will draw labs to rule out potential micronutrient deficiencies which may impede wound healing: vitamin A  and CRP (to best interpret results)  -Patient is at low threshold for NGT placement, however, recommend if intake does not improve secondary to mental status. Discussed recommendation and assessment findings with RN, MD, and palliative care. Recommend:    Initiate Osmolite 1.5 @ 20 ml/hr and increase by 10 ml every 12 hours to goal rate of 50 ml/hr.    60 ml Prosource TF20 daily   Tube feeding regimen provides 1880 kcal (100% of needs), 95 grams of protein, and 914 ml of H2O.     -If feedings are pursued, recommend 100 mg thiamine daily x 7 days and monitor Mg, K, and Phos and replete as needed secondary to refeeding risk    NUTRITION DIAGNOSIS:   Inadequate oral intake related to lethargy/confusion, poor appetite as evidenced by meal completion < 25%.  Ongoing  GOAL:   Patient will meet greater than or equal to 90% of their needs  UNmet  MONITOR:   PO intake, Supplement acceptance, Diet advancement  REASON FOR ASSESSMENT:   Consult Assessment of nutrition requirement/status  ASSESSMENT:   83 y.o. female with medical history significant of hypertension, CKD stage 3, hypothyroidism, history of lacunar infarct, atrial fibrillation on Eliquis , hypercalcemia who presents due to  altered mental status.  Reviewed I/O's: +100 ml x 24 hours and +3.6 L since admission   Patient sitting up in bed at time of visit. Patient's mental status improved in comparison to prior visit. Patient sitting up and talking, however, unable to provide accurate history. Patient was able to drink water and take pills without difficulty, but refused sips of juice, Magic Cup, french toast, and bacon.   Patient fixated on getting my feet over there. LPN, nurse tech, and RD assisted patient to transfer to the recliner chair per her request and encouraged patient to eat.   Noted patient with no teeth; unsure of access to dentures. RD will downgrade diet for ease of intake. Patient took a piece of french toast and put it up to her lips, but did not eat it.   Case discussed with LPN, MD, and nurse tech.   No new weight since last visit.   Per TOC notes, plan for SNF placement.   Palliative care following for goals of care discussions.   Medications reviewed and include vitamin C , cardizem , remeron , MVI, and zinc  sulfate.   Labs reviewed. CRP:<3. Vitamin A  pending.   Diet Order:   Diet Order             Diet regular Fluid consistency: Thin  Diet effective now                   EDUCATION NEEDS:   Not appropriate for education at this time  Skin:  Skin Assessment: Skin Integrity Issues: Skin Integrity Issues:: Stage  II, Stage I Stage I: right buttocks Stage II: left buttocks  Last BM:  10/04/24 (type 7)  Height:   Ht Readings from Last 1 Encounters:  09/29/24 5' (1.524 m)    Weight:   Wt Readings from Last 1 Encounters:  09/29/24 65.6 kg    Ideal Body Weight:  45.5 kg  BMI:  Body mass index is 28.24 kg/m.  Estimated Nutritional Needs:   Kcal:  1800-2000  Protein:  90-105 grams  Fluid:  1.8-2.0 L    Margery ORN, RD, LDN, CDCES Registered Dietitian III Certified Diabetes Care and Education Specialist If unable to reach this RD, please use RD Inpatient  group chat on secure chat between hours of 8am-4 pm daily

## 2024-10-04 NOTE — Plan of Care (Signed)

## 2024-10-04 NOTE — Progress Notes (Signed)
 " Progress Note   Patient: Laura Bishop FMW:[id number] DOB: Aug 18, 1941 DOA: 09/29/2024     5 DOS: the patient was seen and examined on 10/04/2024   Brief hospital course:  Laura Bishop is a 83 y.o. female with medical history significant of hypertension, CKD stage 3, hypothyroidism, history of lacunar infarct, atrial fibrillation on Eliquis , hypercalcemia who presents to the emergency department via EMS from home due to altered mental status.  Patient's home health nurse noted patient being unstable on her feet.  O2 sat checked was 95% on room air, EMS was activated and on arrival of EMS team, patient O2 sat dropped to 78% when she walked to the stretcher, 3 LPM of oxygen via [place] was provided with improved O2 sat to 100%.  She recently had a fall whereby she sustained fracture of surgical neck of left humerus which was repaired at [facility] and she also recently lost her son about a month ago, so she has not been eating/drinking normally since after the surgery   ED course In the emergency department, she was bradycardic.  Workup in the ED showed normocytic anemia.  BUN 30, creatinine 1.93 (baseline creatinine 1.5 -Care Everywhere).  Urinalysis was suggestive of UTI, proBNP 579, troponin 56 > 52 CT of head showed no acute intracranial hemorrhage or mass effect Patient was started on IV ceftriaxone .  IV Cardizem  5 mg x 1 was given due to paroxysmal atrial fibrillation.  IV hydration was provided      Assessment and Plan:  Acute metabolic encephalopathy Dementia Most likely related to UTI and hypercalcemia. Per patient's daughter she has had confusion for over 2 months but worse now from her baseline. Her calcium levels have normalized with IV fluid hydration with corrected calcium of 9.6 Continue to hold [place] PTH 18 Vitamin D  level is 42.2 Urine culture yields 40,000 colonies of Pseudomonas Aeruginosa and it is pan sensitive Continue IV ceftazidime  to complete a 5 to 7-day course of  therapy Patient remains agitated and currently requires a sitter to prevent her from hurting herself. Appreciate behavioral health consult.   Continue mirtazapine  7.5 mg at night and as needed hydroxyzine  for agitation Appreciate dietitian input.  Diet has been downgraded to dysphagia 2 for ease of intake       Acute kidney injury superimposed on stage IIIb chronic kidney disease Secondary to poor oral intake and increased calcium levels Renal function has improved with IV fluid hydration     Paroxysmal atrial fibrillation with intermittent bradycardia and sinus pause Appreciate cardiology input.   Recommend to  continue metoprolol  at a reduced dose of 12.5 mg daily due to intermittent bradycardia Continue Eliquis  as primary prophylaxis for an acute stroke     Hypothyroidism Continue Synthroid      Elevated troponin Most likely secondary to demand ischemia Shows a downward trend       Generalized weakness Physical deconditioning PT evaluation Recommend skilled nursing facility upon discharge                Subjective: Patient is awake and alert.  Oriented to person and place  Physical Exam: Vitals:   10/03/24 1920 10/04/24 0452 10/04/24 0812 10/04/24 1109  BP: 126/69 135/62 110/74 129/73  Pulse: 61 65 64 (!) 56  Resp: 16 16 18 18   Temp:   97.8 F (36.6 C) 98 F (36.7 C)  TempSrc:      SpO2: 100% 98% 100% 100%  Weight:      Height:  General exam: Awake, alert and oriented to person and place Respiratory system: Clear to auscultation. Respiratory effort normal. Cardiovascular system: Irregularly irregular, S1 & S2+. No rubs, gallops or clicks.  Gastrointestinal system: Abdomen is nondistended, soft and nontender. Normal bowel sounds heard. Central nervous system: Generalized weakness Psychiatry: Judgement and insight appears poor. Flat mood and affect      Data Reviewed:  There are no new results to review at this time.  Family Communication:  Patient's daughter updated with plan of care.  Disposition: Status is: Inpatient Remains inpatient appropriate because: Awaiting discharge to SNF  Planned Discharge Destination: Skilled nursing facility    Time spent: 40 minutes  Author: Aimee Somerset, MD 10/04/2024 12:23 PM  For on call review [email or web address].  "

## 2024-10-04 NOTE — Progress Notes (Signed)
 "                                                                                                                                                                                               Palliative Care Progress Note, Assessment & Plan   Patient Name: Laura Bishop       Date: 10/04/2024 DOB: [birth year]  Age: 83 y.o. MRN#: [id number] Attending Physician: Lanetta Lingo, MD Primary Care Physician: Sherial Bail, MD Admit Date: 09/29/2024  Subjective: Patient is lying in bed asleep.  She easily awakens to my verbal stimuli.  She makes eye contact with me but makes no vocalizations during my visit.  Nurse tech is at bedside during my visit.  No family or friends present at bedside during my visit.  HPI: 83 y.o. female  with past medical history significant for HTN, CKD stage III, hypothyroidism, history of lacunar infarct, paroxysmal A-fib on Eliquis , IDA, meningioma and FTT. Patient presented to ED from home  09/29/2024 c/o AMS and FTT.  EMS reported patient's home health nurse found patient walking around outside unsteadily in her pajamas.  Health nurse reported patient's SpO2 was initially in the 70s.  EMS initial on scene SpO2 was 95% but when patient walked with EMS SpO2 desatted to 78%.  Patient was placed on 3 L [place] with O2 improving to 100%.    In the ED, patient's husband reports she has not been eating or drinking for about a week, after returning home from [facility] after shoulder surgery. He also reported she had been a little more confused over the past week with decreased p.o. intake.  The husband shares that her son died Jun 19, 2024 of this year.   ED workup found BUN 30, creatinine 1.93, hypercalcemic at 11.9, GFR 25.  Hgb 9.8, HCT 31.3.  BMP 579, troponin 56 => 52. UA was positive for large leukocytes, proteinuria and rare bacteria.   ED vitals 119/68, heart rate 54, respiration rate 17, SpO2 95%, 97.5 F   EKG-NSR, rate 54 with no gross ST elevation or depression, no  significant repolarization abnormality.  Left axis deviation, normal intervals.  No clear evidence of ischemia nor arrhythmia per EDP interpretation.   CXR showed probable atelectatic changes at left lung base and elevated left hemidiaphragm.   CT head demonstrated no acute intracranial hemorrhage or mass effect.  No substantial change since 07/28/2024 study.   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   Palliative medicine team was consulted for assistance with goals of care conversations.  Summary of counseling/coordination of care: Extensive chart review completed prior to meeting patient including labs, vital signs, imaging, progress notes, orders, and available advanced directive documents from current and previous encounters.   After reviewing the patient's chart and assessing the patient at bedside, I spoke with patient in regards to symptom management and goals of care.   I attempted to complete a symptom assessment with patient.  During my visit yesterday, I discussed pain management and utilizing scheduled Tylenol .  I reminded patient that I had scheduled Tylenol  and wanted to see if she felt a difference in her experience of pain.  However, patient closed her eyes and did not engage in discussions with me today.  Nonverbal signs of pain or discomfort not noted such as brow furrowing, grimacing, fidgeting, or moaning/groaning.  No adjustment to [facility] needed at this time.  Counseled with nurse tech who shared that patient had a few bites of breakfast and a piece of bacon.  However, she has been sleeping ever since.  After visiting with patient and receiving updates from nurse tech, I spoke with patient's daughter/decision-maker [name] over the phone.  Brief medical update  given.  Reviewed registered dietitian's recommendations to downgrade diet to dysphagia to for ease of intake.  [name] was grateful and appreciative of the updates.  She is in agreement for the diet to be downgraded as to make it easier for her mother to eat.  [name] suggested offering sweets and spaghetti to the patient later in the day as she is not a morning person.  Diet request sent to nurse tech and RN for future food orders.  I discussed the bigger picture and overall prognosis with [name].  Reviewed remaining hopeful but also realistic and expectations of patient's recovery, mobility, and cognitive status.  She shares she is hopeful patient continues to show minor improvements every day.  She remains in agreement to continue with current plan of care and for patient to discharge to SNF for rehab.  Symptom burden is low.  Goals are clear.  [name] made aware that PMT will step back from daily visits and monitor the patient peripherally.    Please re-engage with PMT if goals change, at patient/family's request, or if patient's health deteriorates during hospitalization.    Physical Exam Vitals reviewed.  Constitutional:      General: She is not in acute distress.    Appearance: She is normal weight.  HENT:     Head: Normocephalic.     Mouth/Throat:     Mouth: Mucous membranes are moist.  Eyes:     Pupils: Pupils are equal, round, and reactive to light.  Cardiovascular:     Rate and Rhythm: Normal rate.  Pulmonary:     Effort: Pulmonary effort is normal.  Abdominal:     Palpations: Abdomen is soft.  Skin:    General: Skin is warm and dry.  Neurological:     Mental Status: She is alert.     Comments: UTA orientation as pt made no vocalizations during my visit  Psychiatric:        Mood and Affect: Mood normal.        Behavior: Behavior normal.              Recommendations:   DNR with limited interventions remains as per previous provider discussions Continue encouraging PO  intake and mobility TOC following for discharge planning  35 minute visit includes: Detailed review of medical records (labs, imaging, vital signs), medically appropriate exam (mental status, respiratory, cardiac, skin), discussed with treatment team, counseling and educating patient, family and staff, documenting clinical information, medication management and coordination of care.  Lamarr L. Arvid, DNP, FNP-BC Palliative Medicine Team   "

## 2024-10-05 DIAGNOSIS — E872 Acidosis, unspecified: Secondary | ICD-10-CM

## 2024-10-05 DIAGNOSIS — N1832 Chronic kidney disease, stage 3b: Secondary | ICD-10-CM | POA: Insufficient documentation

## 2024-10-05 DIAGNOSIS — E876 Hypokalemia: Secondary | ICD-10-CM | POA: Insufficient documentation

## 2024-10-05 DIAGNOSIS — G9341 Metabolic encephalopathy: Secondary | ICD-10-CM | POA: Diagnosis not present

## 2024-10-05 DIAGNOSIS — F03918 Unspecified dementia, unspecified severity, with other behavioral disturbance: Secondary | ICD-10-CM | POA: Diagnosis not present

## 2024-10-05 DIAGNOSIS — N3000 Acute cystitis without hematuria: Secondary | ICD-10-CM | POA: Diagnosis not present

## 2024-10-05 DIAGNOSIS — L8990 Pressure ulcer of unspecified site, unspecified stage: Secondary | ICD-10-CM | POA: Insufficient documentation

## 2024-10-05 LAB — MAGNESIUM: Magnesium: 2.2 mg/dL (ref 1.7–2.4)

## 2024-10-05 LAB — POTASSIUM: Potassium: 3.7 mmol/L (ref 3.5–5.1)

## 2024-10-05 MED ORDER — SODIUM BICARBONATE 650 MG PO TABS
1300.0000 mg | ORAL_TABLET | Freq: Two times a day (BID) | ORAL | Status: AC
  Administered 2024-10-05 – 2024-10-08 (×7): 1300 mg via ORAL
  Filled 2024-10-05 (×7): qty 2

## 2024-10-05 MED ORDER — QUETIAPINE FUMARATE 25 MG PO TABS
25.0000 mg | ORAL_TABLET | Freq: Every day | ORAL | Status: DC
  Administered 2024-10-05 – 2024-10-07 (×3): 25 mg via ORAL
  Filled 2024-10-05 (×3): qty 1

## 2024-10-05 NOTE — Hospital Course (Addendum)
 Laura Bishop is a 83 y.o. female with medical history significant of hypertension, CKD stage 3, hypothyroidism, history of lacunar infarct, atrial fibrillation on Eliquis , hypercalcemia who presents to the emergency department via EMS from home due to altered mental status.  Patient's home health nurse noted patient being unstable on her feet.  EMS was activated and on arrival of EMS team, patient O2 sat dropped to 78% when she walked to the stretcher, 3 LPM of oxygen via [place] was provided with improved O2 sat to 100%.  She recently had a fall whereby she sustained fracture of surgical neck of left humerus which was repaired at [facility] and she also recently lost her son about a month ago, so she has not been eating/drinking normally since after the surgery   In the emergency department, she was bradycardic.  Workup in the ED showed normocytic anemia.  BUN 30, creatinine 1.93 (baseline creatinine 1.5 -Care Everywhere).  Urinalysis was suggestive of UTI, proBNP 579, troponin 56 > 52 CT of head showed no acute intracranial hemorrhage or mass effect Patient was started on IV ceftriaxone .  IV Cardizem  5 mg x 1 was given due to paroxysmal atrial fibrillation.  IV hydration was provided. Urine culture came back with Pseudomonas, antibiotic switched to ceftazidime , so far has completed course.  Mental status also improved, initially pending nursing home placement, but denied by insurance company.  Patient will be discharged home with home care.

## 2024-10-05 NOTE — Consult Note (Signed)
 [facility] Psychiatric Consult Follow Up  Patient Name: .Laura Bishop  MRN: [id number]  DOB: 12/16/1940  Consult Order details:  Orders (From admission, onward)     Start     Ordered   10/02/24 1018  IP CONSULT TO PSYCHIATRY       Ordering Provider: Lanetta Lingo, MD  Provider:  Ruther Millie SAUNDERS, MD  Question Answer Comment  Location [facility]   Reason for Consult? Agitation.  History of dementia      10/02/24 1017             Mode of Visit: In person    Psychiatry Consult Evaluation  Service Date: October 05, 2024 LOS:  LOS: 6 days  Chief Complaint Aggressive behaviors secondary to dementia  Primary Psychiatric Diagnoses  Dementia   Assessment   Laura Bishop is a 83 y.o. female admitted: Medicallyfor 09/29/2024 11:51 AM for  acute metabolic encephalopathy. She carries the psychiatric diagnoses of Unknown and has a past medical history of  hypertension, CKD stage three, hypothyroidism, atrial fibrillation on Eliquis , hypercalcemia and history of lacunar infarct.   Chart review shows that husband reported to cardiology that there has been a significant decline in her cognitive status since her most recent hospitalization. Note from cardiology reports plan to look for reversible factors, such as metabolic or meds (09/27/2024).   Baseline is unclear. Husband (primary medical decision maker) is currently admitted into a hospital in [place]. And current caretaker at her side has limited information. Unable to get in contact with her daughter at the time of evaluation.  Unable to assess for orientation as patient refused to talk. Patient presented as confused, but was calm and cooperative throughout assessment. Patient did not display aggressive behaviors during assessment.   10/05/2024: Psychiatry attempted to see patient on rounds today. Patient was noted to be sleeping in bed when this provider entered the room and was not disturbed  at this time. Psychiatry will continue to be available as needed while patient remains medically admitted.  Diagnoses:  Active Hospital problems: Principal Problem:   UTI (urinary tract infection) Active Problems:   Acute metabolic encephalopathy   Pressure injury of skin   Dementia with behavioral disturbance (HCC)   CKD stage 3b, GFR 30-44 ml/min (HCC)   Metabolic acidosis   Hypokalemia    Plan   ## Psychiatric Medication Recommendations:  Chart review completed. Cardiology appointment on 09/27/24 indicates mirtazapine  as a home medication for patient.  - Can consider restarting mirtazapine  (recommend starting at 7.5 mg at night) to see if helpful for aggression.    At this time, only PRN medication's can be suggested due to husband being admitted into the hospital and inability to reach daughter causing limited information/consent. Can consider:    - If acute, severe agitation: Haldol  PRN can be considered, using lowest dose that is effective.   Due to lack of information and inability to contact family member, psychiatric interventions are limited at this time. With dementia related aggression it is recommended to try nonpharmacological interventions first. If symptoms continue and cause concern for safety, pharmacological measures can be considered, specifically antipsychotics. However, antipsychotics carry the blackbox warning of increased mortality in patient's with dementia. To initiate a scheduled antipsychotic, family member involvement would be necessary to explain the risks versus benefit and to obtain consent.   ## Medical Decision Making Capacity: Not specifically addressed in this encounter  ## Further Work-up:   -- most recent EKG on 10/02/2024  had QtC of 457 -- Pertinent labwork reviewed earlier this admission includes: CBC, Magnesium , BMP  (GFR 32), TSH (5.670), CMP   ## Disposition:-- There are no psychiatric contraindications to discharge at this time  ##  Behavioral / Environmental: -Patient would benefit from more frequent contact with medical team to delineate plan of care and allow for clarification questions, which will help alleviate anxiety regarding treatment. If possible, try to check back in with the pt in the afternoon.    ## Safety and Observation Level:  - Based on my clinical evaluation, I estimate the patient to be at low risk of self harm in the current setting. - At this time, we recommend  routine. This decision is based on my review of the chart including patient's history and current presentation, interview of the patient, mental status examination, and consideration of suicide risk including evaluating suicidal ideation, plan, intent, suicidal or self-harm behaviors, risk factors, and protective factors. This judgment is based on our ability to directly address suicide risk, implement suicide prevention strategies, and develop a safety plan while the patient is in the clinical setting. Please contact our team if there is a concern that risk level has changed.  CSSR Risk Category:C-SSRS RISK CATEGORY: No Risk  Suicide Risk Assessment: Patient has following modifiable risk factors for suicide: N/A. Patient has following non-modifiable or demographic risk factors for suicide: Unknown Patient has the following protective factors against suicide:  unknown  Thank you for this consult request. Recommendations have been communicated to the primary team.  We will continue to consult at this time.   Zelda Sharps, NP        History of Present Illness  Relevant Aspects of [facility]   Patient Report:  Due to patient's diagnosis of dementia, she is a poor historian. Information gathered from caretaker at bedside named Angie. Angie reports that she is a friend from their church, but she used to be a caretaker so she has been helping out. She reports that the patient's husband is currently admitted into the hospital in [facility]  for pneumonia. The patient usually lives at home with just her husband and no other supports.  She reports having a limited medical or psychiatric history about the patient. She does know that she has a daughter, however has never seen her and does not know her name. She does not believe that she is actively involved. Her husband has been trying to do it alone at home, however, has sustained injuries with trying to lift her and take care of her.  Angie is unsure of any past psychiatric history. She has mentioned that the patient's husband has noted increased aggression from the patient. This aggression includes yelling out and screaming. Denies any physical aggression that she knows. She identifies all the patient has been in the hospital that the patient has been trying to rip out her IV. She has also noted that it appears the patient is not sleeping well. She reports the patient often won't respond to you or talk back to you. This is her normal. She has noted that there has been a study decline in the patients dementia.   Writer attempted to talk to patient. Patient looked at clinical research associate and looked away. She would not answer any questions.  Psych ROS:  Depression:  Unknown. Anxiety:   Unknown. Mania (lifetime and current):  Unknown. Psychosis: (lifetime and current):  Unknown.  Collateral information:  Husband is currently in hospital and his primary caregiver. Daughter is  listed as emergency contact. Attempted to contact. Voicemail is full.    Psychiatric and Social History  Psychiatric History:  Information collected from Chart  Prev Dx/Sx: Unknown Current Psych Provider:  unknown Home Meds (current): mirtazapine  Previous Med Trials:  unknown Therapy:  unknown  Prior Psych Hospitalization:  unknown Prior Self Harm:  unknown Prior Violence:  recent aggression at home in the form of verbalization  Family Psych History:  unknown Family Hx suicide:  unknown  Social History:    Educational Hx:  unknown Occupational Hx:  unknown Legal Hx:  unknown Living Situation:  with husband Spiritual Hx:  friend reports patient going to church Access to weapons/lethal means:   unknown  Substance History Alcohol:  unknown Type of alcohol  unknown Last Drink  unknown Number of drinks per day  unknown History of alcohol withdrawal seizures  unknown History of DT's  unknown Tobacco:  unknown Illicit drugs:  unknown Prescription drug abuse:  unknown Rehab hx:  unknown  Exam Findings  Physical Exam: Reviewed and agree with the physical exam findings conducted by the medical provider Vital Signs:  Temp:  [97.6 F (36.4 C)-98.1 F (36.7 C)] 97.6 F (36.4 C) (12/25 0803) Pulse Rate:  [65-80] 73 (12/25 0803) Resp:  [14-18] 14 (12/25 0803) BP: (90-122)/(53-73) 90/53 (12/25 0803) SpO2:  [96 %-99 %] 99 % (12/25 0803) Blood pressure (!) 90/53, pulse 73, temperature 97.6 F (36.4 C), resp. rate 14, height 5' (1.524 m), weight 65.6 kg, SpO2 99%. Body mass index is 28.24 kg/m.    Mental Status Exam: General Appearance: Disheveled  Orientation:  Other:  unable to assess  Memory:  Unable to assess  Concentration:  Concentration:  unable to assess and Attention Span:  unable to assess  Recall:   unable to assess  Attention  Poor  Eye Contact:  Poor  Speech:  Garbled  Language:  Poor  Volume:  Decreased  Mood:  unable to assess  Affect:   unable to assess  Thought Process:   unable to assess  Thought Content:   unable to assess  Suicidal Thoughts:   unable to asses  Homicidal Thoughts:   unable to ass  Judgement:  Impaired  Insight:  Lacking  Psychomotor Activity:  Normal  Akathisia:  NA  Fund of Knowledge:   unable to assess      Assets:  Housing Social Support  Cognition:  Impaired,  Severe  ADL's:  Impaired  AIMS (if indicated):        Other History   These have been pulled in through the EMR, reviewed, and updated if appropriate.  Family History:   The patient's family history includes Lung cancer in her father; Stomach cancer in her mother.  Medical History: Past Medical History:  Diagnosis Date   Chronic kidney disease    chronic kidney disease stage III   Chronic lower back pain    Hyperlipidemia    Hypertension    Hypothyroidism    IDA (iron deficiency anemia)    Meningioma (HCC)    Osteoarthritis of both knees    osteoarthritis of knee   Osteoporosis    Overactive bladder    Vitamin D  deficiency     Surgical History: Past Surgical History:  Procedure Laterality Date   ABDOMINAL HYSTERECTOMY     APPENDECTOMY     BACK SURGERY     COLONOSCOPY WITH PROPOFOL  N/A 07/20/2016   Procedure: COLONOSCOPY WITH PROPOFOL ;  Surgeon: Lamar ONEIDA Holmes, MD;  Location: [facility] ENDOSCOPY;  Service: Endoscopy;  Laterality: N/A;   COLONOSCOPY WITH PROPOFOL  N/A 02/27/2019   Procedure: COLONOSCOPY WITH PROPOFOL ;  Surgeon: Viktoria Lamar DASEN, MD;  Location: [facility] ENDOSCOPY;  Service: Endoscopy;  Laterality: N/A;   ESOPHAGOGASTRODUODENOSCOPY (EGD) WITH PROPOFOL  N/A 07/20/2016   Procedure: ESOPHAGOGASTRODUODENOSCOPY (EGD) WITH PROPOFOL ;  Surgeon: Lamar DASEN Viktoria, MD;  Location: [facility] ENDOSCOPY;  Service: Endoscopy;  Laterality: N/A;   ESOPHAGOGASTRODUODENOSCOPY (EGD) WITH PROPOFOL  N/A 01/29/2020   Procedure: ESOPHAGOGASTRODUODENOSCOPY (EGD) WITH PROPOFOL ;  Surgeon: Toledo, Ladell POUR, MD;  Location: [facility] ENDOSCOPY;  Service: Gastroenterology;  Laterality: N/A;   EYE SURGERY     extraction cataract   lobectomy total thyroid        Medications:  Current Medications[1]  Allergies: Allergies[2]  Zelda Sharps, NP This note was created using Dragon dictation software. Please excuse any inadvertent transcription errors. Case was discussed with supervising physician Dr. Jadapalle who is agreeable with current plan.       [1]  Current Facility-Administered Medications:    (feeding supplement) PROSource Plus liquid 30 mL, 30 mL, Oral, TID BM,  Agbata, Tochukwu, MD, 30 mL at 10/05/24 0802   acetaminophen  (TYLENOL ) tablet 650 mg, 650 mg, Oral, TID, Arvid Collar, FNP, 650 mg at 10/05/24 0802   apixaban  (ELIQUIS ) tablet 2.5 mg, 2.5 mg, Oral, BID, Trudy Anthony HERO, MD, 2.5 mg at 10/05/24 0802   ascorbic acid  (VITAMIN C ) tablet 500 mg, 500 mg, Oral, BID, Agbata, Tochukwu, MD, 500 mg at 10/05/24 0802   diltiazem  (CARDIZEM ) injection 10 mg, 10 mg, Intravenous, Once, Agbata, Tochukwu, MD   feeding supplement (ENSURE PLUS HIGH PROTEIN) liquid 237 mL, 237 mL, Oral, TID BM, Agbata, Tochukwu, MD, 237 mL at 10/05/24 0915   haloperidol  (HALDOL ) tablet 5 mg, 5 mg, Oral, Q6H PRN **OR** haloperidol  lactate (HALDOL ) injection 5 mg, 5 mg, Intramuscular, Q6H PRN, Trudy Anthony HERO, MD, 5 mg at 10/01/24 2011   hydrOXYzine  (ATARAX ) tablet 10 mg, 10 mg, Oral, TID PRN, Agbata, Tochukwu, MD, 10 mg at 10/04/24 1542   levothyroxine  (SYNTHROID ) tablet 75 mcg, 75 mcg, Oral, Q0600, Trudy Anthony HERO, MD, 75 mcg at 10/05/24 9382   metoprolol  succinate (TOPROL -XL) 24 hr tablet 12.5 mg, 12.5 mg, Oral, Daily, Hudson, Caralyn, PA-C, 12.5 mg at 10/04/24 9092   mirtazapine  (REMERON ) tablet 7.5 mg, 7.5 mg, Oral, QHS, Agbata, Tochukwu, MD, 7.5 mg at 10/04/24 2022   multivitamin with minerals tablet 1 tablet, 1 tablet, Oral, Daily, Agbata, Tochukwu, MD, 1 tablet at 10/05/24 0802   ondansetron  (ZOFRAN ) tablet 4 mg, 4 mg, Oral, Q6H PRN **OR** ondansetron  (ZOFRAN ) injection 4 mg, 4 mg, Intravenous, Q6H PRN, Adefeso, Oladapo, DO   QUEtiapine  (SEROQUEL ) tablet 25 mg, 25 mg, Oral, QHS, Zhang, Dekui, MD   sodium bicarbonate  tablet 1,300 mg, 1,300 mg, Oral, BID, Zhang, Dekui, MD   zinc  sulfate (50mg  elemental zinc ) capsule 220 mg, 220 mg, Oral, Daily, Agbata, Tochukwu, MD, 220 mg at 10/05/24 0802 [2]  Allergies Allergen Reactions   Other Other (See Comments)    Contraindicated by renal insufficiency Contraindicated by renal insufficiency    Estrogens    Nsaids

## 2024-10-05 NOTE — Progress Notes (Signed)
 No new orders received from Dr. Lawence  about the patients abnormal heart rythum.

## 2024-10-05 NOTE — Progress Notes (Signed)
 " Progress Note   Patient: Laura Bishop FMW:[id number] DOB: [birth year] DOA: 09/29/2024     6 DOS: the patient was seen and examined on 10/05/2024   Brief hospital course: Laura Bishop is a 83 y.o. female with medical history significant of hypertension, CKD stage 3, hypothyroidism, history of lacunar infarct, atrial fibrillation on Eliquis , hypercalcemia who presents to the emergency department via EMS from home due to altered mental status.  Patient's home health nurse noted patient being unstable on her feet.  EMS was activated and on arrival of EMS team, patient O2 sat dropped to 78% when she walked to the stretcher, 3 LPM of oxygen via [place] was provided with improved O2 sat to 100%.  She recently had a fall whereby she sustained fracture of surgical neck of left humerus which was repaired at [facility] and she also recently lost her son about a month ago, so she has not been eating/drinking normally since after the surgery   In the emergency department, she was bradycardic.  Workup in the ED showed normocytic anemia.  BUN 30, creatinine 1.93 (baseline creatinine 1.5 -Care Everywhere).  Urinalysis was suggestive of UTI, proBNP 579, troponin 56 > 52 CT of head showed no acute intracranial hemorrhage or mass effect Patient was started on IV ceftriaxone .  IV Cardizem  5 mg x 1 was given due to paroxysmal atrial fibrillation.  IV hydration was provided   Principal Problem:   UTI (urinary tract infection) Active Problems:   Acute metabolic encephalopathy   Pressure injury of skin   Dementia with behavioral disturbance (HCC)   CKD stage 3b, GFR 30-44 ml/min (HCC)   Metabolic acidosis   Hypokalemia   Assessment and Plan: Acute metabolic encephalopathy secondary to urinary tract infection. Pseudomonas urinary tract infection Dementia with delirium. Failure to thrive. Initial altered mental status was secondary to UTI and hypercalcemia, urine culture grew Pseudomonas, she has completed  antibiotics. However, patient still has significant confusion and agitation.  Spoke with RN, patient does not sleep well at night, she yell out at night.  She also has a poor p.o. intake, she to take 30% of meals. I have added Seroquel  every evening.  Continue protein supplement. Patient is a followed by palliative care.   stage IIIb chronic kidney disease Acute kidney injury ruled out. Metabolic acidosis. Hypokalemia. Hypercalcemia improved. Patient renal function has been stabilized, but she did not meet criteria for AKI. She still has significant metabolic acidosis, will start sodium bicarb. Potassium has normalized.    Paroxysmal atrial fibrillation with intermittent bradycardia and sinus pause Appreciate cardiology input.   Recommend to  continue metoprolol  at a reduced dose of 12.5 mg daily due to intermittent bradycardia Continue Eliquis  as primary prophylaxis for an acute stroke    Hypothyroidism Continue Synthroid     Elevated troponin Most likely secondary to demand ischemia Shows a downward trend    Generalized weakness Physical deconditioning Pending nursing home placement.        Subjective:  Spoke with RN, patient to eat 30% of meals, she does not sleep at night.  She has occasional agitation.  No shortness of breath.  Physical Exam: Vitals:   10/04/24 1958 10/04/24 2308 10/05/24 0410 10/05/24 0803  BP: 105/61 120/73 122/62 (!) 90/53  Pulse: 71 80 65 73  Resp: 18 18 17 14   Temp: 97.9 F (36.6 C) 98.1 F (36.7 C) 97.9 F (36.6 C) 97.6 F (36.4 C)  TempSrc: Oral Oral Oral   SpO2: 99% 96% 97% 99%  Weight:      Height:       General exam: Appears calm and comfortable  Respiratory system: Clear to auscultation. Respiratory effort normal. Cardiovascular system: S1 & S2 heard, RRR. No JVD, murmurs, rubs, gallops or clicks. No pedal edema. Gastrointestinal system: Abdomen is nondistended, soft and nontender. No organomegaly or masses felt. Normal bowel  sounds heard. Central nervous system: Alert and oriented x2. No focal neurological deficits. Extremities: Symmetric 5 x 5 power. Skin: No rashes, lesions or ulcers Psychiatry: Flat affect   Data Reviewed:  Lab results reviewed.  Family Communication: None  Disposition: Status is: Inpatient Remains inpatient appropriate because: Severity of disease, altered mental status, pending placement     Time spent: 50 minutes  Author: Murvin Mana, MD 10/05/2024 10:49 AM  For on call review [email or web address].    "

## 2024-10-05 NOTE — Progress Notes (Addendum)
 CCMD called this clinical research associate 2 times and told this clinical research associate that the patient had a small run of PVCs and A Fib. The patient has a history of A Fib. HR 66. Dr. Madison Peaches notified. The patient is asleep at present.

## 2024-10-05 NOTE — TOC Progression Note (Signed)
 Transition of Care [facility]) - Progression Note    Patient Details  Name: Laura Bishop MRN: [id number] Date of Birth: 02/13/1941  Transition [facility] Care [facility]) CM/SW Contact  Nathanael CHRISTELLA Ring, RN Phone Number: 10/05/2024, 11:02 AM  Clinical Narrative:    [employer] [employer] and started insurance auth for SNF and EMS w/ [facility].    Expected Discharge Plan: Skilled Nursing Facility Barriers to Discharge: Continued Medical Work up               Expected Discharge Plan and Services     Post Acute Care Choice: Skilled Nursing Facility                                         Social Drivers of Health (SDOH) Interventions SDOH Screenings   Food Insecurity: Patient Unable To Answer (09/30/2024)  Housing: Patient Declined (09/30/2024)  Transportation Needs: Patient Unable To Answer (09/30/2024)  Utilities: Patient Unable To Answer (09/30/2024)  Financial Resource Strain: Low Risk  (07/03/2024)   Received from [facility]  Social Connections: Patient Unable To Answer (09/30/2024)  Tobacco Use: Low Risk (09/29/2024)  Recent Concern: Tobacco Use - Medium Risk (09/27/2024)   Received from [facility]    Readmission Risk Interventions     No data to display

## 2024-10-05 NOTE — TOC Progression Note (Signed)
 Transition of Care [facility]) - Progression Note    Patient Details  Name: ALITHEA LAPAGE MRN: [id number] Date of Birth: Jul 06, 1941  Transition of Care [facility]) CM/SW Contact  Nathanael CHRISTELLA Ring, RN Phone Number: 10/05/2024, 10:53 AM  Clinical Narrative:    Received a call from daughter, [name], Presented bed offers, she chooses [facility].  CM will start insurance authorization.    Expected Discharge Plan: Skilled Nursing Facility Barriers to Discharge: Continued Medical Work up               Expected Discharge Plan and Services     Post Acute Care Choice: Skilled Nursing Facility                                         Social Drivers of Health (SDOH) Interventions SDOH Screenings   Food Insecurity: Patient Unable To Answer (09/30/2024)  Housing: Patient Declined (09/30/2024)  Transportation Needs: Patient Unable To Answer (09/30/2024)  Utilities: Patient Unable To Answer (09/30/2024)  Financial Resource Strain: Low Risk  (07/03/2024)   Received from [facility]  Social Connections: Patient Unable To Answer (09/30/2024)  Tobacco Use: Low Risk (09/29/2024)  Recent Concern: Tobacco Use - Medium Risk (09/27/2024)   Received from [facility]    Readmission Risk Interventions     No data to display

## 2024-10-06 DIAGNOSIS — G9341 Metabolic encephalopathy: Secondary | ICD-10-CM | POA: Diagnosis not present

## 2024-10-06 DIAGNOSIS — N3000 Acute cystitis without hematuria: Secondary | ICD-10-CM | POA: Diagnosis not present

## 2024-10-06 DIAGNOSIS — F03918 Unspecified dementia, unspecified severity, with other behavioral disturbance: Secondary | ICD-10-CM | POA: Diagnosis not present

## 2024-10-06 DIAGNOSIS — N1832 Chronic kidney disease, stage 3b: Secondary | ICD-10-CM | POA: Diagnosis not present

## 2024-10-06 LAB — BASIC METABOLIC PANEL WITH GFR
Anion gap: 10 (ref 5–15)
BUN: 15 mg/dL (ref 8–23)
CO2: 19 mmol/L — ABNORMAL LOW (ref 22–32)
Calcium: 8.7 mg/dL — ABNORMAL LOW (ref 8.9–10.3)
Chloride: 113 mmol/L — ABNORMAL HIGH (ref 98–111)
Creatinine, Ser: 1.5 mg/dL — ABNORMAL HIGH (ref 0.44–1.00)
GFR, Estimated: 34 mL/min — ABNORMAL LOW
Glucose, Bld: 81 mg/dL (ref 70–99)
Potassium: 3.5 mmol/L (ref 3.5–5.1)
Sodium: 142 mmol/L (ref 135–145)

## 2024-10-06 MED ORDER — POTASSIUM CHLORIDE 20 MEQ PO PACK
40.0000 meq | PACK | Freq: Once | ORAL | Status: AC
  Administered 2024-10-06: 40 meq via ORAL
  Filled 2024-10-06: qty 2

## 2024-10-06 MED ORDER — BOOST / RESOURCE BREEZE PO LIQD CUSTOM
1.0000 | Freq: Three times a day (TID) | ORAL | Status: DC
  Administered 2024-10-06 – 2024-10-08 (×6): 1 via ORAL

## 2024-10-06 NOTE — Progress Notes (Signed)
 Nutrition Follow-up  DOCUMENTATION CODES:   Not applicable  INTERVENTION:   -Obtain new weight -Downgrade diet to dysphagia 2 for ease of intake -Feeding assistance with meals -Continue MVI with minerals daily -D/c Ensure Plus High Protein po TID, each supplement provides 350 kcal and 20 grams of protein  -Continue Magic cup TID with meals, each supplement provides 290 kcal and 9 grams of protein  -D/c  30 ml Prosource Plus TID, each supplement provides 100 kcals and 15 grams protein  -Boost Breeze po TID, each supplement provides 250 kcal and 9 grams of protein  -Continue MVI with minerals daily -Continue 500 mg vitamin C  BID -Continue 220 mg zinc  sulfate daily x 14 days -Patient is at low threshold for NGT placement, however, recommend if intake does not improve secondary to mental status. Discussed recommendation and assessment findings with RN, MD, and palliative care. Recommend:    Initiate Osmolite 1.5 @ 20 ml/hr and increase by 10 ml every 12 hours to goal rate of 50 ml/hr.    60 ml Prosource TF20 daily   Tube feeding regimen provides 1880 kcal (100% of needs), 95 grams of protein, and 914 ml of H2O.     -If feedings are pursued, recommend 100 mg thiamine daily x 7 days and monitor Mg, K, and Phos and replete as needed secondary to refeeding risk   NUTRITION DIAGNOSIS:   Inadequate oral intake related to lethargy/confusion, poor appetite as evidenced by meal completion < 25%.  Ongoing  GOAL:   Patient will meet greater than or equal to 90% of their needs  Unmet  MONITOR:   PO intake, Supplement acceptance, Diet advancement  REASON FOR ASSESSMENT:   Consult Assessment of nutrition requirement/status  ASSESSMENT:   83 y.o. female with medical history significant of hypertension, CKD stage 3, hypothyroidism, history of lacunar infarct, atrial fibrillation on Eliquis , hypercalcemia who presents due to altered mental status.  Reviewed I/O's: +3.8 L since  admission  Patient lying in bed at time of visit, less interactive than prior to visit. Patient continued to state help me during visit, but was unable to state what was bothering her or what she needed despite multiple RD attempts to calm patient.   No family present at time of visit.   Patient still with poor oral intake. She is refusing Ensure and Prosource supplements. Documented meal completions 0-30%. Will try Boost Breeze supplement as patient prefers sweets.   No new weight since admission. RD re-examined patient and she had significantly more muclde depletions in comparison to prior exams. Requested new weight to further evaluate weight trends. She does not meet criteria for malnutrition at this time, but remains at high risk given advanced aged, multiple co-morbidities, and poor oral intake.   Palliative care following for goals of care discussions. Patient is not a morning person and daughter requesting to offer sweets during the day.   Per TOC notes, plan for SNF placement ([facility]) once medically stable and insurance authorization has been obtained.   Medications reviewed and include remeron .   Labs reviewed: Vitamin A  WDL.    Diet Order:   Diet Order             DIET DYS 2 Room service appropriate? Yes; Fluid consistency: Thin  Diet effective now                   EDUCATION NEEDS:   Not appropriate for education at this time  Skin:  Skin Assessment: Skin Integrity  Issues: Skin Integrity Issues:: Stage II, Stage I Stage I: right buttocks Stage II: left buttocks  Last BM:  10/05/24 (type 5)  Height:   Ht Readings from Last 1 Encounters:  09/29/24 5' (1.524 m)    Weight:   Wt Readings from Last 1 Encounters:  09/29/24 65.6 kg    Ideal Body Weight:  45.5 kg  BMI:  Body mass index is 28.24 kg/m.  Estimated Nutritional Needs:   Kcal:  1800-2000  Protein:  90-105 grams  Fluid:  1.8-2.0 L    Margery ORN, RD, LDN, CDCES Registered  Dietitian III Certified Diabetes Care and Education Specialist If unable to reach this RD, please use RD Inpatient group chat on secure chat between hours of 8am-4 pm daily

## 2024-10-06 NOTE — Progress Notes (Signed)
 " Progress Note   Patient: Laura Bishop FMW:[id number] DOB: Dec 17, 1940 DOA: 09/29/2024     7 DOS: the patient was seen and examined on 10/06/2024   Brief hospital course: Laura Bishop is a 83 y.o. female with medical history significant of hypertension, CKD stage 3, hypothyroidism, history of lacunar infarct, atrial fibrillation on Eliquis , hypercalcemia who presents to the emergency department via EMS from home due to altered mental status.  Patient's home health nurse noted patient being unstable on her feet.  EMS was activated and on arrival of EMS team, patient O2 sat dropped to 78% when she walked to the stretcher, 3 LPM of oxygen via [place] was provided with improved O2 sat to 100%.  She recently had a fall whereby she sustained fracture of surgical neck of left humerus which was repaired at [facility] and she also recently lost her son about a month ago, so she has not been eating/drinking normally since after the surgery   In the emergency department, she was bradycardic.  Workup in the ED showed normocytic anemia.  BUN 30, creatinine 1.93 (baseline creatinine 1.5 -Care Everywhere).  Urinalysis was suggestive of UTI, proBNP 579, troponin 56 > 52 CT of head showed no acute intracranial hemorrhage or mass effect Patient was started on IV ceftriaxone .  IV Cardizem  5 mg x 1 was given due to paroxysmal atrial fibrillation.  IV hydration was provided   Principal Problem:   UTI (urinary tract infection) Active Problems:   Acute metabolic encephalopathy   Pressure injury of skin   Dementia with behavioral disturbance (HCC)   CKD stage 3b, GFR 30-44 ml/min (HCC)   Metabolic acidosis   Hypokalemia   Assessment and Plan: Acute metabolic encephalopathy secondary to urinary tract infection. Pseudomonas urinary tract infection Dementia with delirium. Failure to thrive. Initial altered mental status was secondary to UTI and hypercalcemia, urine culture grew Pseudomonas, she has completed  antibiotics. However, patient still has significant confusion and agitation.  Spoke with RN, patient does not sleep well at night, she yell out at night.  She also has a poor p.o. intake, she to take 30% of meals. I have added Seroquel  every evening.  Continue protein supplement. Patient is followed by palliative care. Discussed with RN, patient has not been drinking her Ensure, and to eat very little.  Prognosis remains very poor. She is sleeping better after Seroquel , no agitation.   Stage IIIb chronic kidney disease Acute kidney injury ruled out. Metabolic acidosis. Hypokalemia. Hypercalcemia improved. Patient renal function has been stabilized, but she did not meet criteria for AKI. She still has significant metabolic acidosis, started sodium bicarb 12/25, improving. Potassium is 3.5 with correction of her metabolic acidosis, will give 1 more dose of oral potassium    Paroxysmal atrial fibrillation with intermittent bradycardia and sinus pause Appreciate cardiology input.   Recommend to  continue metoprolol  at a reduced dose of 12.5 mg daily due to intermittent bradycardia Continue Eliquis  as primary prophylaxis for an acute stroke    Hypothyroidism Continue Synthroid     Elevated troponin Most likely secondary to demand ischemia Shows a downward trend    Generalized weakness Physical deconditioning Pending nursing home placement.           Subjective:  Spoke with the nurse, patient still has very poor appetite, refused her Ensure, not eating much.  But no nausea vomiting.  Had a bowel movement yesterday.  Physical Exam: Vitals:   10/05/24 1243 10/05/24 1615 10/06/24 0455 10/06/24 0729  BP:  133/79 (!) 108/57 (!) 129/94 (!) 99/52  Pulse: 76 73 95 86  Resp: 16 15 18 16   Temp: 98.7 F (37.1 C) 98.7 F (37.1 C) 98.1 F (36.7 C) 98.1 F (36.7 C)  TempSrc:    Oral  SpO2: 95% 98% 90% 97%  Weight:      Height:       General exam: Appears calm and comfortable   Respiratory system: Clear to auscultation. Respiratory effort normal. Cardiovascular system: S1 & S2 heard, RRR. No JVD, murmurs, rubs, gallops or clicks. No pedal edema. Gastrointestinal system: Abdomen is nondistended, soft and nontender. No organomegaly or masses felt. Normal bowel sounds heard. Central nervous system: Alert and oriented x1. No focal neurological deficits. Extremities: Symmetric 5 x 5 power. Skin: No rashes, lesions or ulcers Psychiatry: Mood & affect appropriate.    Data Reviewed:  Lab results reviewed.  Family Communication: Daughter updated over the phone  Disposition: Status is: Inpatient Remains inpatient appropriate because: Unsafe discharge, pending nursing home placement     Time spent: 35 minutes  Author: Murvin Mana, MD 10/06/2024 11:00 AM  For on call review [email or web address]    "

## 2024-10-06 NOTE — Plan of Care (Signed)

## 2024-10-07 DIAGNOSIS — N3000 Acute cystitis without hematuria: Secondary | ICD-10-CM | POA: Diagnosis not present

## 2024-10-07 DIAGNOSIS — E876 Hypokalemia: Secondary | ICD-10-CM | POA: Diagnosis not present

## 2024-10-07 DIAGNOSIS — Z66 Do not resuscitate: Secondary | ICD-10-CM | POA: Diagnosis not present

## 2024-10-07 DIAGNOSIS — Z515 Encounter for palliative care: Secondary | ICD-10-CM | POA: Diagnosis not present

## 2024-10-07 DIAGNOSIS — G9341 Metabolic encephalopathy: Secondary | ICD-10-CM | POA: Diagnosis not present

## 2024-10-07 DIAGNOSIS — Z7189 Other specified counseling: Secondary | ICD-10-CM | POA: Diagnosis not present

## 2024-10-07 DIAGNOSIS — Z789 Other specified health status: Secondary | ICD-10-CM | POA: Diagnosis not present

## 2024-10-07 DIAGNOSIS — F03918 Unspecified dementia, unspecified severity, with other behavioral disturbance: Secondary | ICD-10-CM | POA: Diagnosis not present

## 2024-10-07 DIAGNOSIS — N1832 Chronic kidney disease, stage 3b: Secondary | ICD-10-CM | POA: Diagnosis not present

## 2024-10-07 DIAGNOSIS — E872 Acidosis, unspecified: Secondary | ICD-10-CM | POA: Diagnosis not present

## 2024-10-07 LAB — BASIC METABOLIC PANEL WITH GFR
Anion gap: 8 (ref 5–15)
BUN: 14 mg/dL (ref 8–23)
CO2: 21 mmol/L — ABNORMAL LOW (ref 22–32)
Calcium: 8.9 mg/dL (ref 8.9–10.3)
Chloride: 113 mmol/L — ABNORMAL HIGH (ref 98–111)
Creatinine, Ser: 1.5 mg/dL — ABNORMAL HIGH (ref 0.44–1.00)
GFR, Estimated: 34 mL/min — ABNORMAL LOW
Glucose, Bld: 91 mg/dL (ref 70–99)
Potassium: 3.5 mmol/L (ref 3.5–5.1)
Sodium: 142 mmol/L (ref 135–145)

## 2024-10-07 LAB — MAGNESIUM: Magnesium: 2.3 mg/dL (ref 1.7–2.4)

## 2024-10-07 LAB — PHOSPHORUS: Phosphorus: 2 mg/dL — ABNORMAL LOW (ref 2.5–4.6)

## 2024-10-07 MED ORDER — POTASSIUM CHLORIDE 20 MEQ PO PACK: 40.0000 meq | PACK | Freq: Once | ORAL | Status: DC

## 2024-10-07 MED ORDER — K PHOS MONO-SOD PHOS DI & MONO 155-852-130 MG PO TABS
500.0000 mg | ORAL_TABLET | Freq: Two times a day (BID) | ORAL | Status: AC
  Administered 2024-10-07 (×2): 500 mg via ORAL
  Filled 2024-10-07 (×2): qty 2

## 2024-10-07 NOTE — Progress Notes (Signed)
 Occupational Therapy Treatment Patient Details Name: Laura Bishop MRN: [id number] DOB: 04/18/1941 Today's Date: 10/07/2024   History of present illness Pt is a 83 y.o. female who presents to the ED via EMS from home due to altered mental status. She recently had a fall whereby she sustained fracture of surgical neck of left humerus which was repaired at [facility] and she also recently lost her son about a month ago, so she has not been eating/drinking normally since after the surgery. PMH of hypertension, CKD stage 3, hypothyroidism, history of lacunar infarct, atrial fibrillation on Eliquis , hypercalcemia   OT comments  Pt making gradual progress towards goals, pt more alert and conversational this date. Requires encouragement and step-by-step cues to initiate and perform all tasks. MIN A to transition to a seated position, MOD A to perform STS with RW, pt partially stands with heavy UE lean on front of RW, unable to correct as pt becomes frustrated and sits back EOB spontaneously. Unable to encourage further ADL or mobility participation, MAX A for BLE mgmt back to supine. Continue to recommend +1 assist with RW for all mobility and ADL performance. OT will follow acutely, discharge recommendation appropriate.       If plan is discharge home, recommend the following:  A lot of help with bathing/dressing/bathroom;Assistance with cooking/housework;Assist for transportation;Help with stairs or ramp for entrance;A lot of help with walking and/or transfers;Direct supervision/assist for medications management;Direct supervision/assist for financial management   Equipment Recommendations  Other (comment)       Precautions / Restrictions Precautions Precautions: Fall Recall of Precautions/Restrictions: Impaired Precaution/Restrictions Comments: hx of progressing dementia Restrictions Weight Bearing Restrictions Per Provider Order: No       Mobility Bed Mobility Overal bed mobility: Needs  Assistance Bed Mobility: Supine to Sit, Sit to Supine     Supine to sit: Min assist, Used rails Sit to supine: Used rails, Max assist   General bed mobility comments: encourgement, step-by-step cues, stands with heavy lean on RW    Transfers Overall transfer level: Needs assistance Equipment used: Rolling walker (2 wheels) Transfers: Sit to/from Stand Sit to Stand: Mod assist           General transfer comment: pt attempts to stand, but becomes frusterated at weakness, and spontaneously returns to a seated position.     Balance Overall balance assessment: Needs assistance Sitting-balance support: Feet supported Sitting balance-Leahy Scale: Fair     Standing balance support: Reliant on assistive device for balance, During functional activity Standing balance-Leahy Scale: Fair Standing balance comment: RW                           ADL either performed or assessed with clinical judgement   ADL Overall ADL's : Needs assistance/impaired                                     Functional mobility during ADLs: Maximal assistance;Rolling walker (2 wheels) General ADL Comments: Session focused on functional mobility with plan to work on grooming tasks standing sink level next to bed, pt initally agreeable to participate but spontaneously transitions back to supine after attempting to stand.     Communication Communication Communication: No apparent difficulties   Cognition Arousal: Alert Behavior During Therapy: Flat affect Cognition: History of cognitive impairments, Cognition impaired             OT -  Cognition Comments: alert to self, makes wants known, requires cues to participate                 Following commands: Impaired Following commands impaired: Follows one step commands inconsistently, Follows one step commands with increased time      Cueing   Cueing Techniques: Tactile cues, Verbal cues, Gestural cues        General  Comments Encourged PO intake with pt declining    Pertinent Vitals/ Pain       Pain Assessment Pain Assessment: No/denies pain   Frequency  Min 2X/week        Progress Toward Goals  OT Goals(current goals can now be found in the care plan section)  Progress towards OT goals: Progressing toward goals      AM-PAC OT 6 Clicks Daily Activity     Outcome Measure   Help from another person eating meals?: A Little Help from another person taking care of personal grooming?: A Little Help from another person toileting, which includes using toliet, bedpan, or urinal?: A Lot Help from another person bathing (including washing, rinsing, drying)?: A Lot Help from another person to put on and taking off regular upper body clothing?: A Lot Help from another person to put on and taking off regular lower body clothing?: A Lot 6 Click Score: 14    End of Session Equipment Utilized During Treatment: Rolling walker (2 wheels)  OT Visit Diagnosis: History of falling (Z91.81);Muscle weakness (generalized) (M62.81);Cognitive communication deficit (R41.841) Symptoms and signs involving cognitive functions: Other cerebrovascular disease   Activity Tolerance Patient tolerated treatment well   Patient Left in bed;with call bell/phone within reach;with bed alarm set   Nurse Communication Mobility status        Time: 0949-1000 OT Time Calculation (min): 11 min  Charges: OT General Charges $OT Visit: 1 Visit OT Treatments $Self Care/Home Management : 8-22 mins  Meghan Warshawsky L. Daryl Beehler, OTR/L  10/07/2024, 10:32 AM

## 2024-10-07 NOTE — Progress Notes (Signed)
 Mobility Specialist - Progress Note     10/07/24 1655  Mobility  Activity Ambulated with assistance;Stood at bedside  Level of Assistance Moderate assist, patient does 50-74%  Assistive Device None  Distance Ambulated (ft) 6 ft  Range of Motion/Exercises Active  Activity Response Tolerated well  Mobility Referral Yes  Mobility visit 1 Mobility    Pt resting in bed on RA upon entry. Pt STS and ambulates to [facility]  ModA with no AD HHA. Pt returned to bed and left with needs in reach. Bed alarm activated.   Guido Rumble Mobility Specialist 10/07/2024, 5:13 PM

## 2024-10-07 NOTE — Progress Notes (Signed)
 " Progress Note   Patient: Laura Bishop FMW:[id number] DOB: Oct 15, 1940 DOA: 09/29/2024     8 DOS: the patient was seen and examined on 10/07/2024   Brief hospital course: RENIKA SHIFLET is a 83 y.o. female with medical history significant of hypertension, CKD stage 3, hypothyroidism, history of lacunar infarct, atrial fibrillation on Eliquis , hypercalcemia who presents to the emergency department via EMS from home due to altered mental status.  Patient's home health nurse noted patient being unstable on her feet.  EMS was activated and on arrival of EMS team, patient O2 sat dropped to 78% when she walked to the stretcher, 3 LPM of oxygen via [place] was provided with improved O2 sat to 100%.  She recently had a fall whereby she sustained fracture of surgical neck of left humerus which was repaired at [facility] and she also recently lost her son about a month ago, so she has not been eating/drinking normally since after the surgery   In the emergency department, she was bradycardic.  Workup in the ED showed normocytic anemia.  BUN 30, creatinine 1.93 (baseline creatinine 1.5 -Care Everywhere).  Urinalysis was suggestive of UTI, proBNP 579, troponin 56 > 52 CT of head showed no acute intracranial hemorrhage or mass effect Patient was started on IV ceftriaxone .  IV Cardizem  5 mg x 1 was given due to paroxysmal atrial fibrillation.  IV hydration was provided   Principal Problem:   UTI (urinary tract infection) Active Problems:   Acute metabolic encephalopathy   Pressure injury of skin   Dementia with behavioral disturbance (HCC)   CKD stage 3b, GFR 30-44 ml/min (HCC)   Metabolic acidosis   Hypokalemia   Hypophosphatemia   Assessment and Plan: Acute metabolic encephalopathy secondary to urinary tract infection. Pseudomonas urinary tract infection Dementia with delirium. Failure to thrive. Initial altered mental status was secondary to UTI and hypercalcemia, urine culture grew Pseudomonas, she has  completed antibiotics. However, patient still has significant confusion and agitation.  Spoke with RN, patient does not sleep well at night, she yell out at night.  She also has a poor p.o. intake, she to take 30% of meals. I have added Seroquel  every evening.  Continue protein supplement. Patient is followed by palliative care. She is sleeping better after Seroquel , no agitation.  Appetite still poor.   Stage IIIb chronic kidney disease Acute kidney injury ruled out. Metabolic acidosis. Hypokalemia. Hypercalcemia improved. Hypophosphatemia. Patient renal function has been stabilized, but she did not meet criteria for AKI. She still has significant metabolic acidosis, started sodium bicarb 12/25, improving. Metabolic acidosis continue to improve, renal function stable.  Will give potassium phosphate  today and recheck level tomorrow.    Paroxysmal atrial fibrillation with intermittent bradycardia and sinus pause Appreciate cardiology input.   Recommend to  continue metoprolol  at a reduced dose of 12.5 mg daily due to intermittent bradycardia Continue Eliquis  as primary prophylaxis for an acute stroke    Hypothyroidism Continue Synthroid     Elevated troponin Most likely secondary to demand ischemia Shows a downward trend    Generalized weakness Physical deconditioning Pending nursing home placement.        Subjective:  Patient was able to walk with physical therapy today, still has a poor appetite.  Physical Exam: Vitals:   10/06/24 1537 10/06/24 2011 10/07/24 0508 10/07/24 0745  BP: 106/70 111/70 130/60 109/63  Pulse: 77 70 74 75  Resp: 16 18 18 15   Temp: 99 F (37.2 C) 98.5 F (36.9 C) 99.1  F (37.3 C) 98.7 F (37.1 C)  TempSrc: Oral Oral Oral Oral  SpO2: 95% 96% 92% 95%  Weight:      Height:       General exam: Appears calm and comfortable  Respiratory system: Clear to auscultation. Respiratory effort normal. Cardiovascular system: S1 & S2 heard, RRR. No  JVD, murmurs, rubs, gallops or clicks. No pedal edema. Gastrointestinal system: Abdomen is nondistended, soft and nontender. No organomegaly or masses felt. Normal bowel sounds heard. Central nervous system: Alert and oriented x2 No focal neurological deficits. Extremities: Symmetric 5 x 5 power. Skin: No rashes, lesions or ulcers Psychiatry:  Mood & affect appropriate.    Data Reviewed:  Lab results reviewed.  Family Communication: None  Disposition: Status is: Inpatient Remains inpatient appropriate because: Unsafe discharge, pending nursing home placement.     Time spent: 35 minutes  Author: Murvin Mana, MD 10/07/2024 12:17 PM  For on call review [email or web address]    "

## 2024-10-07 NOTE — Progress Notes (Signed)
 "                                                                                                                                                                                               Palliative Care Progress Note, Assessment & Plan   Patient Name: Laura Bishop       Date: 10/07/2024 DOB: Feb 09, 1941  Age: 83 y.o. MRN#: [id number] Attending Physician: Laurita Pillion, MD Primary Care Physician: Sherial Bail, MD Admit Date: 09/29/2024  Subjective: Complains of leg pain. Denies CP/SOB. Not sleeping well. Reports no appetite, but is asking for grapes.   HPI: 83 y.o. female  with past medical history significant for HTN, CKD stage III, hypothyroidism, history of lacunar infarct, paroxysmal A-fib on Eliquis , IDA, meningioma and FTT. Patient presented to ED from home  09/29/2024 c/o AMS and FTT.  EMS reported patient's home health nurse found patient walking around outside unsteadily in her pajamas.  Health nurse reported patient's SpO2 was initially in the 70s.  EMS initial on scene SpO2 was 95% but when patient walked with EMS SpO2 desatted to 78%.  Patient was placed on 3 L [place] with O2 improving to 100%.    In the ED, patient's husband reports she has not been eating or drinking for about a week, after returning home from [facility] after shoulder surgery. He also reported she had been a little more confused over the past week with decreased p.o. intake.  The husband shares that her son died 05-24-25 of this year.   ED workup found BUN 30, creatinine 1.93, hypercalcemic at 11.9, GFR 25.  Hgb 9.8, HCT 31.3.  BMP 579, troponin 56 => 52. UA was positive for large leukocytes, proteinuria and rare bacteria.   ED vitals 119/68, heart rate 54, respiration rate 17, SpO2 95%, 97.5 F   EKG-NSR, rate 54 with no gross ST elevation or depression, no significant repolarization abnormality.  Left axis deviation, normal intervals.  No clear evidence of ischemia nor arrhythmia per EDP  interpretation.   CXR showed probable atelectatic changes at left lung base and elevated left hemidiaphragm.   CT head demonstrated no acute intracranial hemorrhage or mass effect.  No substantial change since 07/28/2024 study.   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   Palliative medicine team was consulted for assistance with goals of care conversations.  Summary of counseling/coordination of care: Extensive chart review completed prior to meeting patient  including labs, vital signs, imaging, progress notes, orders, and available advanced directive documents from current and previous encounters.   After reviewing the patient's chart and assessing the patient at bedside, I spoke with patient in regards to symptom management and goals of care.      Latest Ref Rng & Units 10/02/2024   10:07 AM 10/01/2024    5:29 AM 09/30/2024    5:22 AM  CBC  WBC 4.0 - 10.5 K/uL 9.4  9.0  9.8   Hemoglobin 12.0 - 15.0 g/dL 9.1  9.8  89.8   Hematocrit 36.0 - 46.0 % 27.6  30.5  31.9   Platelets 150 - 400 K/uL 282  330  358       Latest Ref Rng & Units 10/07/2024    5:57 AM 10/06/2024    7:54 AM 10/05/2024    6:44 AM  CMP  Glucose 70 - 99 mg/dL 91  81    BUN 8 - 23 mg/dL 14  15    Creatinine 9.55 - 1.00 mg/dL 8.49  8.49    Sodium 864 - 145 mmol/L 142  142    Potassium 3.5 - 5.1 mmol/L 3.5  3.5  3.7   Chloride 98 - 111 mmol/L 113  113    CO2 22 - 32 mmol/L 21  19    Calcium 8.9 - 10.3 mg/dL 8.9  8.7      Elderly, ill-appearing female lying in bed. No family or visitors at bedside. She is alert to self, year, location and DOB but appears to have intermittent confusion during conversation. She states reason she is here as [place] . She is unable to name current president.    Patient appears to have progressed with improved mentation over the past few days, but is still having little to no PO intake. Bedside tray remains untouched except for a few bites from magic cup.   Per TOC note, plan to transfer to [facility] once medically stable.   Therapeutic silence and active listening provided for patient to share her thoughts and emotions regarding current medical situation.  Emotional support provided.  Physical Exam Vitals reviewed.  Constitutional:      General: She is not in acute distress.    Appearance: She is ill-appearing.  HENT:     Head: Normocephalic and atraumatic.     Mouth/Throat:     Mouth: Mucous membranes are moist.  Pulmonary:     Effort: Pulmonary effort is normal. No respiratory distress.  Musculoskeletal:     Right lower leg: No edema.     Left lower leg: No edema.  Skin:    General: Skin is warm and dry.  Neurological:     Mental Status: She is alert and oriented to person, place, and time.  Psychiatric:        Mood and Affect: Mood normal.        Behavior: Behavior normal.    Recommendations/Plan: DNR/DNI  Continue current supportive interventions Following TOC for disposition  PMT will continue to follow         Total Time 50 minutes   Time spent includes: Detailed review of medical records (labs, imaging, vital signs), medically appropriate exam (mental status, respiratory, cardiac, skin), discussed with treatment team, counseling and educating patient, family and staff, documenting clinical information, medication management and coordination of care.     Devere Sacks, AMANDA [facility] Palliative Medicine Team  10/07/2024 8:52 AM  Office [phone]  Pager [phone]     "

## 2024-10-07 NOTE — Plan of Care (Signed)
  Problem: Activity: Goal: Risk for activity intolerance will decrease Outcome: Not Progressing   Problem: Coping: Goal: Level of anxiety will decrease Outcome: Not Progressing   Problem: Safety: Goal: Ability to remain free from injury will improve Outcome: Not Progressing   Problem: Skin Integrity: Goal: Risk for impaired skin integrity will decrease Outcome: Not Progressing

## 2024-10-07 NOTE — TOC Progression Note (Signed)
 Transition of Care [facility]) - Progression Note    Patient Details  Name: Laura Bishop MRN: [id number] Date of Birth: May 03, 1941  [facility] CM/SW Contact  Victory Jackquline RAMAN, RN Phone Number: 10/07/2024, 6:09 PM  Clinical Narrative:    RNCM received a call and fax from [facility] notifying me that the Auth for SNF was denied and that the P2P window had expired and the Auth for the ambulance was approved. Auth# [id number]. Copy of denial letter/approval letter given to the patient. RNCM will continue to follow for discharge planning needs.  Expected Discharge Plan: Skilled Nursing Facility Barriers to Discharge: Continued Medical Work up               Expected Discharge Plan and Services     Post Acute Care Choice: Skilled Nursing Facility                                         Social Drivers of Health (SDOH) Interventions SDOH Screenings   Food Insecurity: Patient Unable To Answer (09/30/2024)  Housing: Patient Declined (09/30/2024)  Transportation Needs: Patient Unable To Answer (09/30/2024)  Utilities: Patient Unable To Answer (09/30/2024)  Financial Resource Strain: Low Risk  (07/03/2024)   Received from [facility]  Social Connections: Patient Unable To Answer (09/30/2024)  Tobacco Use: Low Risk (09/29/2024)  Recent Concern: Tobacco Use - Medium Risk (09/27/2024)   Received from [facility]    Readmission Risk Interventions     No data to display

## 2024-10-07 NOTE — Progress Notes (Signed)
 Physical Therapy Treatment Patient Details Name: Laura Bishop MRN: [id number] DOB: 1940-10-24 Today's Date: 10/07/2024   History of Present Illness Pt is a 83 y.o. female who presents to the ED via EMS from home due to altered mental status. She recently had a fall whereby she sustained fracture of surgical neck of left humerus which was repaired at [facility] and she also recently lost her son about a month ago, so she has not been eating/drinking normally since after the surgery. PMH of hypertension, CKD stage 3, hypothyroidism, history of lacunar infarct, atrial fibrillation on Eliquis , hypercalcemia    PT Comments  Pt requires Min -Mod encouragement to participate in OOB therapy, and pt progressed with gait (with RW 25' CGA), but pt declined ending session in recliner and was assisted back into bed in chair position. Pt performs basic mobility at an overall Min A level. Pt continues to experience limitations to mobility, demonstrating decreased activity tolerance, general weakness, and L knee pain.   Continue to recommend post acute rehab <3 hours therapy/day upon d/c.     If plan is discharge home, recommend the following: A little help with walking and/or transfers;A little help with bathing/dressing/bathroom;Help with stairs or ramp for entrance;Assist for transportation;Assistance with feeding;Assistance with cooking/housework;Supervision due to cognitive status   Can travel by private vehicle     Yes  Equipment Recommendations  Rolling walker (2 wheels);BSC/3in1    Recommendations for Other Services       Precautions / Restrictions Precautions Precautions: Fall Recall of Precautions/Restrictions: Impaired Precaution/Restrictions Comments: hx of progressing dementia Restrictions Weight Bearing Restrictions Per Provider Order: No     Mobility  Bed Mobility Overal bed mobility: Needs Assistance Bed Mobility: Supine to Sit, Sit to Supine     Supine to sit: Min assist, Used  rails, HOB elevated Sit to supine: Min assist   General bed mobility comments: encourgement, step-by-step cues to scoot up to [facility] (HHA +1)    Transfers Overall transfer level: Needs assistance   Transfers: Sit to/from Stand, Bed to chair/wheelchair/BSC Sit to Stand: Supervision   Step pivot transfers: Min assist            Ambulation/Gait Ambulation/Gait assistance: Min assist Gait Distance (Feet): 25 Feet Assistive device: Rolling walker (2 wheels) Gait Pattern/deviations: Step-to pattern, Step-through pattern, Drifts right/left, Trunk flexed, Narrow base of support Gait velocity: decreased     General Gait Details: Min A to navigate walker   Stairs             Wheelchair Mobility     Tilt Bed    Modified Rankin (Stroke Patients Only)       Balance Overall balance assessment: Needs assistance Sitting-balance support: Feet supported Sitting balance-Leahy Scale: Good     Standing balance support: Reliant on assistive device for balance, During functional activity, Single extremity supported, No upper extremity supported Standing balance-Leahy Scale: Fair Standing balance comment: functional standing balance activity with reaching and 1-0 UE support, no LOB, CGA                            Communication Communication Communication: No apparent difficulties  Cognition Arousal: Alert Behavior During Therapy: Flat affect   PT - Cognitive impairments: History of cognitive impairments, Orientation, Awareness, Problem solving, Safety/Judgement                       PT - Cognition Comments: mod encoragement to participate  Cueing Cueing Techniques: Tactile cues, Verbal cues, Gestural cues  Exercises      General Comments        Pertinent Vitals/Pain Pain Assessment Pain Assessment: Faces Faces Pain Scale: Hurts a little bit Pain Location: L knee Pain Descriptors / Indicators: Aching Pain Intervention(s): Monitored  during session    Home Living                          Prior Function            PT Goals (current goals can now be found in the care plan section) Progress towards PT goals: Progressing toward goals    Frequency    Min 2X/week      PT Plan      Co-evaluation              AM-PAC PT 6 Clicks Mobility   Outcome Measure  Help needed turning from your back to your side while in a flat bed without using bedrails?: None Help needed moving from lying on your back to sitting on the side of a flat bed without using bedrails?: A Little Help needed moving to and from a bed to a chair (including a wheelchair)?: A Little Help needed standing up from a chair using your arms (e.g., wheelchair or bedside chair)?: A Little Help needed to walk in hospital room?: A Little Help needed climbing 3-5 steps with a railing? : A Lot 6 Click Score: 18    End of Session Equipment Utilized During Treatment: Gait belt Activity Tolerance: Patient limited by fatigue Patient left: in bed;with bed alarm set;with call bell/phone within reach   PT Visit Diagnosis: Other abnormalities of gait and mobility (R26.89);Muscle weakness (generalized) (M62.81);Difficulty in walking, not elsewhere classified (R26.2);History of falling (Z91.81);Unsteadiness on feet (R26.81)     Time: 8540-8485 PT Time Calculation (min) (ACUTE ONLY): 15 min  Charges:    $Therapeutic Activity: 23-37 mins PT General Charges $$ ACUTE PT VISIT: 1 Visit                     Harland Irving, PTA  10/07/2024, 3:29 PM

## 2024-10-07 NOTE — Progress Notes (Signed)
 Pt alert and oriented in bed; assisted x3 to bsc.  Vitals stable, no significant changes, RN gave report to new RN assigned for pt.

## 2024-10-08 DIAGNOSIS — Z7189 Other specified counseling: Secondary | ICD-10-CM | POA: Diagnosis not present

## 2024-10-08 DIAGNOSIS — Z515 Encounter for palliative care: Secondary | ICD-10-CM | POA: Diagnosis not present

## 2024-10-08 DIAGNOSIS — N1832 Chronic kidney disease, stage 3b: Secondary | ICD-10-CM | POA: Diagnosis not present

## 2024-10-08 DIAGNOSIS — G9341 Metabolic encephalopathy: Secondary | ICD-10-CM | POA: Diagnosis not present

## 2024-10-08 DIAGNOSIS — N3000 Acute cystitis without hematuria: Secondary | ICD-10-CM | POA: Diagnosis not present

## 2024-10-08 DIAGNOSIS — Z66 Do not resuscitate: Secondary | ICD-10-CM | POA: Diagnosis not present

## 2024-10-08 LAB — PHOSPHORUS: Phosphorus: 3.6 mg/dL (ref 2.5–4.6)

## 2024-10-08 LAB — CBC
HCT: 24.9 % — ABNORMAL LOW (ref 36.0–46.0)
Hemoglobin: 8.1 g/dL — ABNORMAL LOW (ref 12.0–15.0)
MCH: 30.3 pg (ref 26.0–34.0)
MCHC: 32.5 g/dL (ref 30.0–36.0)
MCV: 93.3 fL (ref 80.0–100.0)
Platelets: 195 K/uL (ref 150–400)
RBC: 2.67 MIL/uL — ABNORMAL LOW (ref 3.87–5.11)
RDW: 17.2 % — ABNORMAL HIGH (ref 11.5–15.5)
WBC: 4.8 K/uL (ref 4.0–10.5)
nRBC: 0 % (ref 0.0–0.2)

## 2024-10-08 LAB — BASIC METABOLIC PANEL WITH GFR
Anion gap: 8 (ref 5–15)
BUN: 15 mg/dL (ref 8–23)
CO2: 23 mmol/L (ref 22–32)
Calcium: 8.3 mg/dL — ABNORMAL LOW (ref 8.9–10.3)
Chloride: 113 mmol/L — ABNORMAL HIGH (ref 98–111)
Creatinine, Ser: 1.53 mg/dL — ABNORMAL HIGH (ref 0.44–1.00)
GFR, Estimated: 33 mL/min — ABNORMAL LOW
Glucose, Bld: 85 mg/dL (ref 70–99)
Potassium: 3.5 mmol/L (ref 3.5–5.1)
Sodium: 144 mmol/L (ref 135–145)

## 2024-10-08 MED ORDER — METOPROLOL SUCCINATE ER 25 MG PO TB24: 12.5000 mg | ORAL_TABLET | Freq: Every day | ORAL | 0 refills | Status: DC

## 2024-10-08 MED ORDER — ZINC SULFATE 220 (50 ZN) MG PO CAPS: 220.0000 mg | ORAL_CAPSULE | Freq: Every day | ORAL | 0 refills | Status: AC

## 2024-10-08 MED ORDER — POTASSIUM CHLORIDE CRYS ER 20 MEQ PO TBCR
40.0000 meq | EXTENDED_RELEASE_TABLET | Freq: Once | ORAL | Status: AC
  Administered 2024-10-08: 40 meq via ORAL
  Filled 2024-10-08: qty 2

## 2024-10-08 MED ORDER — QUETIAPINE FUMARATE 25 MG PO TABS: 25.0000 mg | ORAL_TABLET | Freq: Every day | ORAL | 0 refills | Status: AC

## 2024-10-08 MED ORDER — MIRTAZAPINE 7.5 MG PO TABS: 7.5000 mg | ORAL_TABLET | Freq: Every day | ORAL | 0 refills | Status: AC

## 2024-10-08 NOTE — Discharge Instructions (Signed)
 [facility]

## 2024-10-08 NOTE — TOC CM/SW Note (Signed)
 Rolling Walker: The beneficiary has a mobility limitation that significantly impairs his/her ability to participate in one or more mobility-related activities of daily living (MRADL) in the home. The patient is able to safely use the walker. The functional mobility deficit can be sufficiently resolved by use of walker.

## 2024-10-08 NOTE — TOC Transition Note (Signed)
 Transition of Care [facility]) - Discharge Note   Patient Details  Name: Laura Bishop MRN: [id number] Date of Birth: [birth year]  Transition [facility] Care [facility]) CM/SW Contact:  Victory Jackquline RAMAN, RN Phone Number: 10/08/2024, 1:42 PM   Clinical Narrative: Patient discharging home with [facility] services Cathie) and OP Palliative care. RW ordered via [employer] to be delivered to the patient's house. Transportation set up with [facility] and there are 2 people ahead of her. MD and bedside nurse made aware. No further concerns. RNCM signing off.    Final next level of care: Home w Home Health Services Barriers to Discharge: Barriers Resolved   Patient Goals and CMS Choice   CMS Medicare.gov Compare Post Acute Care list provided to:: Patient Represenative (must comment) Choice offered to / list presented to : Adult Children      Discharge Placement                Patient to be transferred to facility by: [facility] Name of family member notified: [name] Patient and family notified of of transfer: 10/08/24  Discharge Plan and Services Additional resources added to the After Visit Summary for       Post Acute Care Choice: Skilled Nursing Facility          DME Arranged: Vannie rolling DME Agency: [employer] Date DME Agency Contacted: 10/08/24   Representative spoke with at DME Agency: Email sent to [employer] for RW            Social Drivers of Health (SDOH) Interventions SDOH Screenings   Food Insecurity: Patient Unable To Answer (09/30/2024)  Housing: Patient Declined (09/30/2024)  Transportation Needs: Patient Unable To Answer (09/30/2024)  Utilities: Patient Unable To Answer (09/30/2024)  Financial Resource Strain: Low Risk  (07/03/2024)   Received from [facility]  Social Connections: Patient Unable To Answer (09/30/2024)  Tobacco Use: Low Risk (09/29/2024)  Recent Concern: Tobacco Use - Medium Risk (09/27/2024)   Received from [facility]     Readmission Risk Interventions     No data to display

## 2024-10-08 NOTE — Progress Notes (Signed)
 "                                                                                                                                                                                               Palliative Care Progress Note, Assessment & Plan   Patient Name: Laura Bishop       Date: 10/08/2024 DOB: 30-Aug-1941  Age: 83 y.o. MRN#: [id number] Attending Physician: Laurita Pillion, MD Primary Care Physician: Sherial Bail, MD Admit Date: 09/29/2024  Subjective: Denies pain. Did not eat breakfast, does not eat breakfast at home. Slept well last night. States she is going home today.   HPI: 82 y.o. female  with past medical history significant for HTN, CKD stage III, hypothyroidism, history of lacunar infarct, paroxysmal A-fib on Eliquis , IDA, meningioma and FTT. Patient presented to ED from home  09/29/2024 c/o AMS and FTT.  EMS reported patient's home health nurse found patient walking around outside unsteadily in her pajamas.  Health nurse reported patient's SpO2 was initially in the 70s.  EMS initial on scene SpO2 was 95% but when patient walked with EMS SpO2 desatted to 78%.  Patient was placed on 3 L [place] with O2 improving to 100%.    In the ED, patient's husband reports she has not been eating or drinking for about a week, after returning home from [facility] after shoulder surgery. He also reported she had been a little more confused over the past week with decreased p.o. intake.  The husband shares that her son died 2025/06/03 of this year.   ED workup found BUN 30, creatinine 1.93, hypercalcemic at 11.9, GFR 25.  Hgb 9.8, HCT 31.3.  BMP 579, troponin 56 => 52. UA was positive for large leukocytes, proteinuria and rare bacteria.   ED vitals 119/68, heart rate 54, respiration rate 17, SpO2 95%, 97.5 F   EKG-NSR, rate 54 with no gross ST elevation or depression, no significant repolarization abnormality.  Left axis deviation, normal intervals.  No clear evidence of ischemia nor arrhythmia per  EDP interpretation.   CXR showed probable atelectatic changes at left lung base and elevated left hemidiaphragm.   CT head demonstrated no acute intracranial hemorrhage or mass effect.  No substantial change since 07/28/2024 study.   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   Palliative medicine team was consulted for assistance with goals of care conversations.  Summary of counseling/coordination of care: Extensive chart review  completed prior to meeting patient including labs, vital signs, imaging, progress notes, orders, and available advanced directive documents from current and previous encounters.   After reviewing the patient's chart and assessing the patient at bedside, I spoke with patient in regards to symptom management and goals of care.      Latest Ref Rng & Units 10/08/2024    5:43 AM 10/02/2024   10:07 AM 10/01/2024    5:29 AM  CBC  WBC 4.0 - 10.5 K/uL 4.8  9.4  9.0   Hemoglobin 12.0 - 15.0 g/dL 8.1  9.1  9.8   Hematocrit 36.0 - 46.0 % 24.9  27.6  30.5   Platelets 150 - 400 K/uL 195  282  330       Latest Ref Rng & Units 10/08/2024    5:43 AM 10/07/2024    5:57 AM 10/06/2024    7:54 AM  CMP  Glucose 70 - 99 mg/dL 85  91  81   BUN 8 - 23 mg/dL 15  14  15    Creatinine 0.44 - 1.00 mg/dL 8.46  8.49  8.49   Sodium 135 - 145 mmol/L 144  142  142   Potassium 3.5 - 5.1 mmol/L 3.5  3.5  3.5   Chloride 98 - 111 mmol/L 113  113  113   CO2 22 - 32 mmol/L 23  21  19    Calcium 8.9 - 10.3 mg/dL 8.3  8.9  8.7      Ill-appearing, elderly female sleeping in bed. She awakens to verbal stimuli and is able to participate in conversation. No family or staff at bedside. She is alert to self, location and month but is unable to correctly year (1955) or DOB.  Respirations are even and unlabored. She is in no distress.   After discussion with Dr. Laurita and Allena, [facility], plan is for patient to d/c home today with [facility]. Suggested patient would be a good candidate for OP palliative to follow.  Contacted [name], daughter, to discuss outpatient palliative following her mother at home. [name] is agreeable to this service and wants to work with [facility].   Placed TOC order for [facility] OP palliative.   Therapeutic silence and active listening provided for patient and daughter to share their thoughts and emotions regarding current medical situation.  Emotional support provided.  Physical Exam Vitals reviewed.  Constitutional:      General: She is not in acute distress.    Appearance: She is ill-appearing.  HENT:     Head: Normocephalic and atraumatic.     Mouth/Throat:     Mouth: Mucous membranes are moist.  Pulmonary:     Effort: Pulmonary effort is normal. No respiratory distress.  Musculoskeletal:     Right lower leg: No edema.     Left lower leg: No edema.  Skin:    General: Skin is warm and dry.  Neurological:     Mental Status: She is alert. She is disoriented.     Motor: Weakness present.  Psychiatric:        Mood and Affect: Mood normal.        Behavior: Behavior normal.    Recommendations/Plan: DNR/DNI  Plan to d/c home today with [facility] OP palliative to follow                Total Time 50 minutes   Discussed plan of care with Dr. Zhang, Allena, TOC and primary RN  Time spent includes: Detailed review of medical  records (labs, imaging, vital signs), medically appropriate exam (mental status, respiratory, cardiac, skin), discussed with treatment team, counseling and educating patient, family and staff, documenting clinical information, medication management and coordination of care.     Devere Sacks, ELNITA- [facility] Palliative Medicine Team  10/08/2024 9:09 AM  Office [phone]  Pager [phone]     "

## 2024-10-08 NOTE — Progress Notes (Signed)
 Patient discharged and transported home via transport. Vitals taken before leaving and within normal parameters. IV removed. No signs of distress. Patient alert and oriented to self.

## 2024-10-08 NOTE — TOC Progression Note (Addendum)
 Transition of Care [facility]) - Progression Note    Patient Details  Name: Laura Bishop MRN: [id number] Date of Birth: 19-Feb-1941  Transition of Care [facility]) CM/SW Contact  Victory Jackquline RAMAN, RN Phone Number: 10/08/2024, 10:27 AM  Clinical Narrative:   RNCM spoke to patient's daughter, [name] @ [phone], introduced my self and my role and explained that discharge planning would be discussed. Informed her that [employer] denied the SNF for STR but approved the ambulance. I let her know that I left a copy of the denial letter at her mom's bedside. She acknowledged that she saw it and said that she is going to speak to her dad and see what he wants to do. She doesn't feel like he can help care for her mom because he recently was discharged from the hospital. She will call me back to let me know how they would like to move forward. RNCM will continue to follow for discharge planning needs.  1041: RNCM received a call back from patient's daughter [name] asking when her mom would be discharged. Per MD the patient is medically stable for discharge today. She would  like to have HH/PT/OT/RN/Aide since SNF was denied for her mom. She states that the patient needs a Environmental Consultant. She has a cane, BSC and Shower Chair already. States that we would need to set up transportation for the patient to get home today. MD made aware. Referrals sent out for [facility] will review offers once they come in with the patients daughter.   1107: RNCM received a call back fro patient's daughter [name] informing me that her mom had [facility] before. RNCM let her know that [facility] had already declined and the rest of the agencies are still pending.  1214: RNCM spoke with [name] and informed her that [facility] accepted her mom and she agreed to go with them. MD made aware.   Expected Discharge Plan: Skilled Nursing Facility Barriers to Discharge: Continued Medical Work up               Expected Discharge Plan and Services     Post  Acute Care Choice: Skilled Nursing Facility                                         Social Drivers of Health (SDOH) Interventions SDOH Screenings   Food Insecurity: Patient Unable To Answer (09/30/2024)  Housing: Patient Declined (09/30/2024)  Transportation Needs: Patient Unable To Answer (09/30/2024)  Utilities: Patient Unable To Answer (09/30/2024)  Financial Resource Strain: Low Risk  (07/03/2024)   Received from [facility]  Social Connections: Patient Unable To Answer (09/30/2024)  Tobacco Use: Low Risk (09/29/2024)  Recent Concern: Tobacco Use - Medium Risk (09/27/2024)   Received from [facility]    Readmission Risk Interventions     No data to display

## 2024-10-08 NOTE — Discharge Summary (Signed)
 " Physician Discharge Summary   Patient: Laura Bishop MRN: [id number] DOB: 03/14/1941  Admit date:     09/29/2024  Discharge date: 10/08/2024  Discharge Physician: Murvin Mana   PCP: Sherial Bail, MD   Recommendations at discharge:   Follow-up with PCP in 1 week. Continue to follow with the palliative care.  Discharge Diagnoses: Principal Problem:   UTI (urinary tract infection) Active Problems:   Acute metabolic encephalopathy   Pressure injury of skin   Dementia with behavioral disturbance (HCC)   CKD stage 3b, GFR 30-44 ml/min (HCC)   Metabolic acidosis   Hypokalemia   Hypophosphatemia  Resolved Problems:   * No resolved hospital problems. *  Hospital Course: Laura Bishop is a 83 y.o. female with medical history significant of hypertension, CKD stage 3, hypothyroidism, history of lacunar infarct, atrial fibrillation on Eliquis , hypercalcemia who presents to the emergency department via EMS from home due to altered mental status.  Patient's home health nurse noted patient being unstable on her feet.  EMS was activated and on arrival of EMS team, patient O2 sat dropped to 78% when she walked to the stretcher, 3 LPM of oxygen via [place] was provided with improved O2 sat to 100%.  She recently had a fall whereby she sustained fracture of surgical neck of left humerus which was repaired at [facility] and she also recently lost her son about a month ago, so she has not been eating/drinking normally since after the surgery   In the emergency department, she was bradycardic.  Workup in the ED showed normocytic anemia.  BUN 30, creatinine 1.93 (baseline creatinine 1.5 -Care Everywhere).  Urinalysis was suggestive of UTI, proBNP 579, troponin 56 > 52 CT of head showed no acute intracranial hemorrhage or mass effect Patient was started on IV ceftriaxone .  IV Cardizem  5 mg x 1 was given due to paroxysmal atrial fibrillation.  IV hydration was provided. Urine culture came back with  Pseudomonas, antibiotic switched to ceftazidime , so far has completed course.  Mental status also improved, initially pending nursing home placement, but denied by insurance company.  Patient will be discharged home with home care.   Assessment and Plan: Acute metabolic encephalopathy secondary to urinary tract infection. Pseudomonas urinary tract infection Dementia with delirium. Failure to thrive. Initial altered mental status was secondary to UTI and hypercalcemia, urine culture grew Pseudomonas, she has completed antibiotics. However, patient still has significant confusion and agitation.  Spoke with RN, patient does not sleep well at night, she yell out at night.   I have added Seroquel  every evening.  Patient also started on Remeron . Patient is followed by palliative care. Patient condition has been improving, she slept well over the last few days, no agitation.  Her appetite also had some improvement.  However, prognosis still poor, will continue followed with palliative care.    Stage IIIb chronic kidney disease Acute kidney injury ruled out. Metabolic acidosis. Hypokalemia. Hypercalcemia improved. Hypophosphatemia. Patient renal function has been stabilized, but she did not meet criteria for AKI. She still has significant metabolic acidosis, started sodium bicarb 12/25, electrolytes are normalized.  Metabolic acidosis resolved.    Paroxysmal atrial fibrillation with intermittent bradycardia and sinus pause Appreciate cardiology input.   Recommend to  continue metoprolol  at a reduced dose of 12.5 mg daily due to intermittent bradycardia Continue Eliquis  as primary prophylaxis for an acute stroke    Hypothyroidism Continue Synthroid     Elevated troponin Most likely secondary to demand ischemia Shows a downward  trend    Generalized weakness Physical deconditioning Insurance denied SNF placement, set up home PT/OT    Pressure ulcer POA: Follow-up with home care  nurse. Wound 09/30/24 0530 Pressure Injury Buttocks Left Stage 2 -  Partial thickness loss of dermis presenting as a shallow open injury with a red, pink wound bed without slough. (Active)     Wound 09/30/24 0530 Pressure Injury Buttocks Right Stage 1 -  Intact skin with non-blanchable redness of a localized area usually over a bony prominence. (Active)           Consultants: None Procedures performed: None  Disposition: Home health Diet recommendation:  Discharge Diet Orders (From admission, onward)     Start     Ordered   10/08/24 0000  Diet general       Comments: Dys 2 diet   10/08/24 1113           ICATION: Allergies as of 10/08/2024       Reactions   Other Other (See Comments)   Contraindicated by renal insufficiency Contraindicated by renal insufficiency   Estrogens    Nsaids         Medication List     STOP taking these medications    amLODipine  5 MG tablet Commonly known as: NORVASC    aspirin 81 MG tablet   benzonatate  200 MG capsule Commonly known as: TESSALON    calcium carbonate 1500 (600 Ca) MG Tabs tablet Commonly known as: OSCAL   chlorhexidine 0.12 % solution Commonly known as: PERIDEX   ciprofloxacin 500 MG tablet Commonly known as: CIPRO   DERMOTIC OT   ferrous sulfate 325 (65 FE) MG EC tablet   hydrochlorothiazide 12.5 MG tablet Commonly known as: HYDRODIURIL   lidocaine  5 % Commonly known as: LIDODERM    losartan  25 MG tablet Commonly known as: COZAAR    metaxalone 800 MG tablet Commonly known as: SKELAXIN   oxybutynin 5 MG tablet Commonly known as: DITROPAN   pravastatin 80 MG tablet Commonly known as: PRAVACHOL   ranitidine 75 MG tablet Commonly known as: ZANTAC   Salonpas Pain Relieving 4 % Generic drug: lidocaine    senna-docusate 8.6-50 MG tablet Commonly known as: Senokot-S       TAKE these medications    acetaminophen  500 MG tablet Commonly known as: TYLENOL  Take by mouth.    diphenhydramine-acetaminophen  25-500 MG Tabs tablet Commonly known as: TYLENOL  PM Take 1 tablet by mouth at bedtime as needed.   Eliquis  2.5 MG Tabs tablet Generic drug: apixaban  Take 2.5 mg by mouth 2 (two) times daily.   levothyroxine  75 MCG tablet Commonly known as: SYNTHROID  Take 75 mcg by mouth daily before breakfast.   metoprolol  succinate 25 MG 24 hr tablet Commonly known as: TOPROL -XL Take 0.5 tablets (12.5 mg total) by mouth daily. Start taking on: October 09, 2024 What changed:  medication strength how much to take   mirtazapine  7.5 MG tablet Commonly known as: REMERON  Take 1 tablet (7.5 mg total) by mouth at bedtime.   multivitamin tablet Take 1 tablet by mouth daily.   QUEtiapine  25 MG tablet Commonly known as: SEROQUEL  Take 1 tablet (25 mg total) by mouth at bedtime.   rosuvastatin 5 MG tablet Commonly known as: CRESTOR Take 5 mg by mouth daily.   solifenacin 10 MG tablet Commonly known as: VESICARE Take 10 mg by mouth daily.   VITAMIN D  PO Take 125 mcg by mouth daily.   zinc  sulfate (50mg  elemental zinc ) 220 (50 Zn) MG capsule Take  1 capsule (220 mg total) by mouth daily for 7 days. Start taking on: October 09, 2024               Durable Medical Equipment  (From admission, onward)           Start     Ordered   10/08/24 1055  For home use only DME Walker rolling  Once       Question Answer Comment  Walker: With 5 Inch Wheels   Patient needs a walker to treat with the following condition Generalized weakness      10/08/24 1054              Discharge Care Instructions  (From admission, onward)           Start     Ordered   10/08/24 0000  Discharge wound care:       Comments: Follow with pcp   10/08/24 1113            Contact information for follow-up providers     Sherial Bail, MD. Schedule an appointment as soon as possible for a visit today.   Specialty: Internal Medicine Contact information: [address] [place] [address] [phone]         Launie Maiden, Ronal Maxwell, NP. Go in 1 week(s).   Specialty: Nurse Practitioner Contact information: [address] [place] [address] [phone]              Contact information for after-discharge care     Destination     [facility] Preferred SNF .   Service: Skilled Nursing Contact information: [address] [place]  [address] [phone]                    Discharge Exam: Fredricka Weights   09/29/24 1201  Weight: 65.6 kg   General exam: Appears calm and comfortable  Respiratory system: Clear to auscultation. Respiratory effort normal. Cardiovascular system: S1 & S2 heard, RRR. No JVD, murmurs, rubs, gallops or clicks. No pedal edema. Gastrointestinal system: Abdomen is nondistended, soft and nontender. No organomegaly or masses felt. Normal bowel sounds heard. Central nervous system: Alert and oriented x3. No focal neurological deficits. Extremities: Symmetric 5 x 5 power. Skin: No rashes, lesions or ulcers Psychiatry: Judgement and insight appear normal. Mood & affect appropriate.    Condition at discharge: good  The results of significant diagnostics from this hospitalization (including imaging, microbiology, ancillary and laboratory) are listed below for reference.   Imaging Studies: DG Chest Portable 1 View Result Date: 09/29/2024 EXAM: 1 VIEW(S) XRAY OF THE CHEST 09/29/2024 12:42:13 PM COMPARISON: 07/28/2024 CLINICAL HISTORY: hypoxia FINDINGS: LUNGS AND PLEURA: Probable atelectatic changes at left lung base. Elevated left hemidiaphragm. No pleural effusion. No pneumothorax. HEART AND MEDIASTINUM: No acute abnormality of the cardiac and mediastinal silhouettes. BONES AND SOFT TISSUES: Left reverse shoulder arthroplasty noted. IMPRESSION: 1. Probable atelectatic changes at the left lung base and elevated left hemidiaphragm.  Electronically signed by: Waddell Calk MD 09/29/2024 02:54 PM EST RP Workstation: [id number]   CT HEAD WO CONTRAST ( ) Result Date: 09/29/2024 EXAM: CT HEAD WITHOUT CONTRAST 09/29/2024 01:31:34 PM TECHNIQUE: CT of the head was performed without the administration of intravenous contrast. Automated exposure control, iterative reconstruction, and/or weight based adjustment of the mA/kV was utilized to reduce the radiation dose to as low as reasonably achievable. COMPARISON: CT head 07/28/2024 and MRI brain 06/22/2018. CLINICAL HISTORY: AMS. FINDINGS: BRAIN  AND VENTRICLES: No acute hemorrhage. No evidence of acute territorial infarct. Chronic right thalamic lacunar infarction. Chronic lacunar infarction in the right extreme capsule. Overall unchanged mild-to-moderate scattered white matter hypodensities which are nonspecific but most commonly represent chronic microvascular ischemic changes. 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 Again partially empty sella, nonspecific. No hydrocephalus. No suspicious extra-axial collection. No mass effect or midline shift. ORBITS: Bilateral lens replacement. SINUSES: Again trace right mastoid effusion. No acute abnormality. SOFT TISSUES AND SKULL: No acute soft tissue abnormality. No skull fracture. IMPRESSION: 1. No acute intracranial hemorrhage or mass effect. 2. No substantial change since 07/28/2024. Electronically signed by: Prentice Spade MD 09/29/2024 01:59 PM EST RP Workstation: [id number]   US  RENAL Result Date: 09/21/2024 CLINICAL DATA:  Chronic kidney disease, acute kidney injury EXAM: RENAL / URINARY TRACT ULTRASOUND COMPLETE COMPARISON:  CT 01/19/2020 FINDINGS: Right Kidney: Renal measurements: 9.1 x 4.8 x 4.2 cm = volume: 96.4 mL. Echogenic cortex. No hydronephrosis. Multiple, fewer than 10 cysts in the right kidney, largest is seen at the midpole and measures 11 mm, no imaging follow-up recommended. Left Kidney: Renal measurements:  7.2 x 2.9 x 3.4 cm = volume: 37.7 mL. And echogenic cortex. Diffuse cortical thinning consistent with atrophy. No hydronephrosis. Multiple, fewer than 10 cysts. Largest is seen at the midpole and measures 20 x 17 x 19 mm. No imaging follow-up is recommended. Bladder: Appears normal for degree of bladder distention. Other: None. IMPRESSION: 1. Echogenic kidneys consistent with medical renal disease. Left kidney is atrophic, there is no evidence for hydronephrosis Electronically Signed   By: Luke Bun M.D.   On: 09/21/2024 23:51    Microbiology: Results for orders placed or performed during the hospital encounter of 09/29/24  Resp panel by RT-PCR (RSV, Flu A&B, Covid) Anterior Nasal Swab     Status: None   Collection Time: 09/29/24  2:01 PM   Specimen: Anterior Nasal Swab  Result Value Ref Range Status   SARS Coronavirus 2 by RT PCR NEGATIVE NEGATIVE Final    Comment: (NOTE) SARS-CoV-2 target nucleic acids are NOT DETECTED.  The SARS-CoV-2 RNA is generally detectable in upper respiratory specimens during the acute phase of infection. The lowest concentration of SARS-CoV-2 viral copies this assay can detect is 138 copies/mL. A negative result does not preclude SARS-Cov-2 infection and should not be used as the sole basis for treatment or other patient management decisions. A negative result may occur with  improper specimen collection/handling, submission of specimen other than nasopharyngeal swab, presence of viral mutation(s) within the areas targeted by this assay, and inadequate number of viral copies(<138 copies/mL). A negative result must be combined with clinical observations, patient history, and epidemiological information. The expected result is Negative.  Fact Sheet for Patients:  bloggercourse.com  Fact Sheet for Healthcare Providers:  seriousbroker.it  This test is no t yet approved or cleared by the [place]  FDA and   has been authorized for detection and/or diagnosis of SARS-CoV-2 by FDA under an Emergency Use Authorization (EUA). This EUA will remain  in effect (meaning this test can be used) for the duration of the COVID-19 declaration under Section 564(b)(1) of the Act, 21 U.S.C.section 360bbb-3(b)(1), unless the authorization is terminated  or revoked sooner.       Influenza A by PCR NEGATIVE NEGATIVE Final   Influenza B by PCR NEGATIVE NEGATIVE Final    Comment: (NOTE) The Xpert Xpress SARS-CoV-2/FLU/RSV plus assay is intended as an aid in the diagnosis of influenza from  Nasopharyngeal swab specimens and should not be used as a sole basis for treatment. Nasal washings and aspirates are unacceptable for Xpert Xpress SARS-CoV-2/FLU/RSV testing.  Fact Sheet for Patients: bloggercourse.com  Fact Sheet for Healthcare Providers: seriousbroker.it  This test is not yet approved or cleared by the [place]  FDA and has been authorized for detection and/or diagnosis of SARS-CoV-2 by FDA under an Emergency Use Authorization (EUA). This EUA will remain in effect (meaning this test can be used) for the duration of the COVID-19 declaration under Section 564(b)(1) of the Act, 21 U.S.C. section 360bbb-3(b)(1), unless the authorization is terminated or revoked.     Resp Syncytial Virus by PCR NEGATIVE NEGATIVE Final    Comment: (NOTE) Fact Sheet for Patients: bloggercourse.com  Fact Sheet for Healthcare Providers: seriousbroker.it  This test is not yet approved or cleared by the [place]  FDA and has been authorized for detection and/or diagnosis of SARS-CoV-2 by FDA under an Emergency Use Authorization (EUA). This EUA will remain in effect (meaning this test can be used) for the duration of the COVID-19 declaration under Section 564(b)(1) of the Act, 21 U.S.C. section 360bbb-3(b)(1),  unless the authorization is terminated or revoked.  Performed at [facility], [address] [place], [place] [address]   Urine Culture     Status: Abnormal   Collection Time: 09/29/24  2:01 PM   Specimen: Urine, Clean Catch  Result Value Ref Range Status   Specimen Description   Final    URINE, CLEAN CATCH Performed at [facility], [address] [place], [place] [address]    Special Requests   Final    NONE Performed at [facility], [address] [place], [place] [address]    Culture 40,000 COLONIES/mL PSEUDOMONAS AERUGINOSA (A)  Final   Report Status 10/02/2024 FINAL  Final   Organism ID, Bacteria PSEUDOMONAS AERUGINOSA (A)  Final      Susceptibility   Pseudomonas aeruginosa - MIC*    MEROPENEM  <=0.25 SENSITIVE Sensitive     CIPROFLOXACIN 0.12 SENSITIVE Sensitive     IMIPENEM 2 SENSITIVE Sensitive     CEFEPIME 4 SENSITIVE Sensitive     CEFTAZIDIME /AVIBACTAM 2 SENSITIVE Sensitive     CEFTOLOZANE/TAZOBACTAM 1 SENSITIVE Sensitive     TOBRAMYCIN <=1 SENSITIVE Sensitive     CEFTAZIDIME  2 SENSITIVE Sensitive     * 40,000 COLONIES/mL PSEUDOMONAS AERUGINOSA    Labs: CBC: Recent Labs  Lab 10/02/24 1007 10/08/24 0543  WBC 9.4 4.8  HGB 9.1* 8.1*  HCT 27.6* 24.9*  MCV 93.2 93.3  PLT 282 195   Basic Metabolic Panel: Recent Labs  Lab 10/01/24 1128 10/02/24 0753 10/02/24 0753 10/03/24 0541 10/05/24 0539 10/05/24 0644 10/06/24 0754 10/07/24 0557 10/08/24 0543  NA  --  141  --  141  --   --  142 142 144  K  --  4.4   < > 3.8  --  3.7 3.5 3.5 3.5  CL  --  109  --  111  --   --  113* 113* 113*  CO2  --  15*  --  16*  --   --  19* 21* 23  GLUCOSE  --  81  --  76  --   --  81 91 85  BUN  --  18  --  15  --   --  15 14 15   CREATININE  --  1.59*  --  1.36*  --   --  1.50* 1.50*  1.53*  CALCIUM  --  10.4*  --  9.3  --   --  8.7* 8.9 8.3*  MG 1.7 2.5*  --   --  2.2  --   --  2.3  --   PHOS  --   --   --   --   --   --   --  2.0* 3.6    < > = values in this interval not displayed.   Liver Function Tests: Recent Labs  Lab 10/03/24 0541  AST 25  ALT 18  ALKPHOS 51  BILITOT 0.2  PROT 5.3*  ALBUMIN 3.1*   CBG: No results for input(s): GLUCAP in the last 168 hours.  Discharge time spent: .  Signed: Murvin Mana, MD [facility] 10/08/2024 "

## 2024-10-08 NOTE — TOC CM/SW Note (Signed)
" °  °  Durable Medical Equipment  (From admission, onward)           Start     Ordered   10/08/24 1055  For home use only DME Walker rolling  Once       Question Answer Comment  Walker: With 5 Inch Wheels   Patient needs a walker to treat with the following condition Generalized weakness      10/08/24 1054            "

## 2024-10-16 ENCOUNTER — Ambulatory Visit
Admission: RE | Admit: 2024-10-16 | Discharge: 2024-10-16 | Disposition: A | Discharge status: Home / Self Care | Payer: Medicare (Managed Care) | Source: Ambulatory Visit | Attending: Internal Medicine | Admitting: Internal Medicine

## 2024-10-16 ENCOUNTER — Other Ambulatory Visit: Payer: Self-pay | Admitting: Internal Medicine

## 2024-10-16 DIAGNOSIS — R410 Disorientation, unspecified: Secondary | ICD-10-CM | POA: Diagnosis present

## 2024-10-16 NOTE — Progress Notes (Signed)
 Established Patient Visit   Chief Complaint: Chief Complaint  Patient presents with   Follow-up    Follow up holter    Date of Service: 10/16/2024 Date of Birth: Mar 26, 1941 PCP: Sherial Bail, MD  History of Present Illness: Laura Bishop is a 84 y.o.female patient with a history of  Syncope/presyncope Hypertension Hyperlipidemia CKD stage IV Bradycardia  She has a history of syncope, documented on 09/2017 and 10/2017. 2D echocardiogram on 11/19/2017 revealed normal left ventricular function, with LVEF greater than 55% with mild LVH with mild aortic, mitral, tricuspid, and pulmonic regurgitation with mild pulmonary hypertension. 48-hour Holter monitor on 11/09/2017 revealed predominant normal sinus rhythm with a mean heart rate of 60 bpm. Sinus bradycardia was observed. There were occasional premature atrial contractions and 2 brief atrial runs, the longest lasting 5 beats. The patient experienced an episode of dizziness and headache while walking into a store on 05/27/2018. She was able to sit in her car while the symptoms passed after about 10 minutes. She presented to [facility] ER at which time her work-up was largely unremarkable. 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 She was seen by neurology as outpatient, and it was felt that her dizziness was most likely consistent with dehydration versus vertigo. Follow-up brain MRI was unremarkable.   She was seen by Laura Maiden, NP on 09/19/2024. She had a fall 07/29/24. She was pushing a cart in [place] and was going too fast per her husband and she fell. Patient reports she was looking for a place to sit down when she fell. She does not remember if she was presyncopal. Per hospital records, 'She had a closed displaced fracture of left humerus. She underwent left reverse total shoulder arthroplasty on 10/20. During her ED course and early hospitalization, she developed new-onset intermittent atrial fibrillation with rapid ventricular response, with rates  up to the 130s-140s, alternating with sinus rhythm and occasional ventricular bigeminy. Started on metoprolol  which was decreased to 12.5 mg TID due to bradycardia. Notably, she would often not receive doses secondary to heart rates < 60. Echocardiogram showed hyperdynamic LVEF (estimated at 70%), normal diastolic and right ventricular function, and no significant valvular disease. Cardiology was consulted and recommended rate control with metoprolol  and anticoagulation with reduced-dose apixaban  2.5 mg BID due to high CHA2DS2-VASc score, recurrent falls, and borderline renal function. She and her husband agreed to this plan after discussion of risks and benefits. No Zio patch was recommended at discharge'.  Review of hospital notes show pt developed acute delirium and functional decline during hospitalization. CT head without acute intracranial abnormality, U/A unremarkable. At baseline mental status at time of discharge per notes. She additionally had down trending Hgb throughout hospitalization without active bleeding. She was started on iron supplementation. Repeat CBC and iron studies by Nephrology 09/11/24 show H/H 10.7/33.7 (up from 8.6 in hospital, down from 11.2/34.3 09/04/24) and normal ferritin, iron, and TIBC. A 72 hr Holter monitor was ordered and the patient was referred to Dr. Melchor for consideration of Watchman.  The patient presents today for a follow-up to review results of Holter monitor. The patient's husband who accompanies her today provides all of the HPI due to patient's cognitive impairment. The patient fell this morning in the kitchen and struck her head on the floor. Per the husband, she was not interacting appropriately this morning and did not call him by the correct name. She states that her confusion seems worse this morning than her baseline. He asked her to sit on the  barstool so he could help button her shirt, but she did not get on the stool and collapsed to the floor. He is  unsure if she lost consciousness preceding the fall. He states that when she hit the ground, she came to quickly and stated that her head hurt. He states that she will voice complaints to him, but has not complained of palpitations or chest pain. She has been having multiple falls at home. She uses a walker at home. She has recently started new medications including gabapentin, Remeron , and Seroquel . She was seen by her primary care provider this morning, and Remeron  and gabapentin were discontinued, Seroquel  advised to take as-needed only, and urinalysis repeated. Head CT ordered.    72-hour Holter monitor 09/19/2024 - 09/22/2024 revealed               1.  Predominant sinus cardia, mean heart rate 55 bpm, sinus heart rate range 32 to 96 bpm             2.  Frequent premature atrial contractions (3%)             3.  Infrequent premature ventricular contractions (less than 1%)             4.  Intermittent atrial fibrillation, AF burden 1%, longest episode 7 minutes and 22.5 seconds             5.  Occasional AF termination pauses, longest 4.1 seconds             6.  No diary entries  Past Medical and Surgical History  Past Medical History Past Medical History:  Diagnosis Date   Chronic kidney disease, stage III (moderate) (CMS-HCC)    Mild   History of cataract 2014 and2017   Hyperlipidemia    Hypertension    Hypothyroidism    Discharged from follow up by Dr. Juengel   Iron deficiency anemia    Meningioma (CMS/HHS-HCC)    thoracic   OA (osteoarthritis) of knee    Osteoarthritis of knee    Osteoporosis, post-menopausal    Overactive bladder    Vitamin D  deficiency     Past Surgical History She has a past surgical history that includes lobectomy total thyroid  (Left, 05/2012); resection meningioma of thoracic spine; Hysterectomy (1985); Extraction Cataract Extracapsular (Left, 11/2013); Extraction Cataract Extracapsular (Right, 08/2014); egd (08/30/2002); Colonoscopy  (08/30/2002); Colonoscopy (02/22/2006); Colonoscopy (06/08/2011); Colonoscopy (07/20/2016); egd (07/20/2016); Appendectomy; Colonoscopy (02/27/2019); egd (01/29/2020); and Cataract extraction (2014/2017).   Medications and Allergies  Current Medications  Current Outpatient Medications  Medication Sig Dispense Refill   acetaminophen  (TYLENOL ) 500 MG tablet Take 1,000 mg by mouth every 8 (eight) hours as needed for Pain        amLODIPine  (NORVASC ) 5 MG tablet TAKE 2 TABLETS BY MOUTH ONCE DAILY FOR BLOOD PRESSURE 180 tablet 0   apixaban  (ELIQUIS ) 2.5 mg tablet Take 2.5 mg by mouth 2 (two) times daily     cholecalciferol, vitamin D3, (VITAMIN D3) 125 mcg (5,000 unit) tablet Take 5,000 Units by mouth once daily     co-enzyme Q-10, ubiquinone, 100 mg capsule Take 100 mg by mouth once daily     diphenhydramine -acetaminophen  (TYLENOL  PM) 25-500 mg per tablet Take 2 tablets by mouth nightly        ferrous sulfate 325 (65 FE) MG tablet Take by mouth daily     levothyroxine  (SYNTHROID ) 75 MCG tablet TAKE 1 TABLET BY MOUTH IN THE MORNING FOR THYROID  90 tablet 3  lidocaine  (LIDODERM ) 5 % patch Place 1 patch onto the skin daily for 30 days Apply patch to the most painful area for up to 12 hours in a 24 hour period. 30 patch 0   melatonin 3 mg tablet Take 3 mg by mouth every evening     metoprolol  SUCCinate (TOPROL -XL) 50 MG XL tablet Take 50 mg by mouth     multivitamin capsule Take 1 capsule by mouth once daily        omega 3-dha-epa-fish oil 1,000 mg (120 mg-180 mg) Cap Take 2 capsules by mouth once daily     omega-3 fatty acids 1,000 mg capsule Take 2 g by mouth every morning     polyethylene glycol (MIRALAX) packet Take 17 g by mouth once daily     QUEtiapine  (SEROQUEL ) 25 MG tablet Take 25 mg by mouth at bedtime     rosuvastatin  (CRESTOR ) 5 MG tablet Take 1 tablet by mouth once daily 90 tablet 3   sennosides-docusate (SENOKOT-S) 8.6-50 mg tablet Take by mouth daily     solifenacin  (VESICARE) 10 MG tablet Take 1 tablet by mouth once daily 90 tablet 1   tiZANidine (ZANAFLEX) 2 MG capsule Take 1 capsule (2 mg total) by mouth at bedtime as needed 30 capsule 1   TURMERIC ORAL Take 500 mg by mouth once daily     zinc  sulfate (ZINCATE) 50 mg zinc  (220 mg) capsule Take 220 mg by mouth once daily     No current facility-administered medications for this visit.    Allergies: Nsaids (non-steroidal anti-inflammatory drug) and Estrogens  Social and Family History  Social History  reports that she has never smoked. She has been exposed to tobacco smoke. She has never used smokeless tobacco. She reports that she does not drink alcohol and does not use drugs.  Family History Family History  Problem Relation Name Age of Onset   Stomach cancer Mother     Cancer Mother     Lung cancer Father     Alcohol abuse Sister     Alcohol abuse Sister     Myocardial Infarction (Heart attack) Brother      Review of Systems   Review of Systems: The patient is unable to complete ROS due to altered mental status.   Physical Examination   Vitals:BP 122/68   Pulse 85   Wt 65.8 kg (145 lb)   SpO2 94%   BMI 28.32 kg/m  Ht:  Wt:65.8 kg (145 lb) ADJ:Anib surface area is 1.67 meters squared. Body mass index is 28.32 kg/m.  General: Alert. Not answering questions. Appears uncomfortable. Head resting in hand, responds to voice. Sitting in wheelchair. No acute distress. HEENT: Pupils equally reactive to light and accomodation    Neck: no obvious JVD Lungs: Normal effort of breathing; no wheezing or audible wheezing Heart: Regular rate and rhythm. No murmur, rub, or gallop Abdomen: no distention  Extremities: no cyanosis, clubbing, with mild nonpitting ankle edema Skin: Warm, dry, no diaphoresis   Assessment   84 y.o. female with  1. Primary hypertension   2. Bradycardia   3. Peripheral edema   4. Altered mental status, unspecified altered mental status type   5.  Recurrent falls    84 year old female with history of CKD stage III, recurrent syncope, bradycardia, recurrent falls, and recent atrial fibrillation during hospitalization for fall and humerus fracture. She was started low dose Eliquis  due to anemia and falls. Holter monitor showed predominant sinus bradycardia with mean heart rate  of 55 bpm with 1% atrial fibrillation burden. She has baseline cognitive impairment with worsening AMS today per husband, as well as another fall today, questionable syncope. Head CT ordered by PCP today. Discussed with PCP who also saw her today; she has discontinued gabapentin and Remeron  which could be contributing to symptoms, and has ordered a UA. Will defer PPM at this time, but will follow up closely.    Plan   Continue current medications Consider discontinuing Eliquis  due to recurrent falls; will discuss with Dr. Melchor for consideration of Watchman Discuss PPM at next visit Head CT per PCP ER precautions discussed with husband Follow up with PCP as scheduled Return to clinic in 1 month  No orders of the defined types were placed in this encounter.   Return in about 1 month (around 11/16/2024).  Attestation Statement:   I personally performed the service, non-incident to. (WP)   ANNA MARIA DRANE, PA-C

## 2024-10-20 ENCOUNTER — Observation Stay: Payer: Medicare (Managed Care)

## 2024-10-20 ENCOUNTER — Inpatient Hospital Stay
Admission: EM | Admit: 2024-10-20 | Discharge: 2024-11-01 | DRG: 291 | Disposition: A | Discharge status: Hospice | Payer: Medicare (Managed Care) | Attending: Internal Medicine | Admitting: Internal Medicine

## 2024-10-20 ENCOUNTER — Emergency Department: Payer: Medicare (Managed Care)

## 2024-10-20 ENCOUNTER — Other Ambulatory Visit: Payer: Self-pay

## 2024-10-20 ENCOUNTER — Encounter: Payer: Self-pay | Admitting: Emergency Medicine

## 2024-10-20 ENCOUNTER — Observation Stay
Admit: 2024-10-20 | Discharge: 2024-10-20 | Disposition: A | Payer: Medicare (Managed Care) | Attending: Internal Medicine

## 2024-10-20 DIAGNOSIS — R627 Adult failure to thrive: Secondary | ICD-10-CM | POA: Diagnosis not present

## 2024-10-20 DIAGNOSIS — J449 Chronic obstructive pulmonary disease, unspecified: Secondary | ICD-10-CM | POA: Diagnosis present

## 2024-10-20 DIAGNOSIS — Z90710 Acquired absence of both cervix and uterus: Secondary | ICD-10-CM

## 2024-10-20 DIAGNOSIS — Z87440 Personal history of urinary (tract) infections: Secondary | ICD-10-CM

## 2024-10-20 DIAGNOSIS — I5033 Acute on chronic diastolic (congestive) heart failure: Secondary | ICD-10-CM | POA: Diagnosis present

## 2024-10-20 DIAGNOSIS — E872 Acidosis, unspecified: Secondary | ICD-10-CM | POA: Diagnosis present

## 2024-10-20 DIAGNOSIS — Z79899 Other long term (current) drug therapy: Secondary | ICD-10-CM

## 2024-10-20 DIAGNOSIS — W19XXXA Unspecified fall, initial encounter: Principal | ICD-10-CM | POA: Diagnosis present

## 2024-10-20 DIAGNOSIS — G8929 Other chronic pain: Secondary | ICD-10-CM | POA: Diagnosis present

## 2024-10-20 DIAGNOSIS — Y92009 Unspecified place in unspecified non-institutional (private) residence as the place of occurrence of the external cause: Secondary | ICD-10-CM

## 2024-10-20 DIAGNOSIS — I3139 Other pericardial effusion (noninflammatory): Secondary | ICD-10-CM | POA: Diagnosis present

## 2024-10-20 DIAGNOSIS — Z79890 Hormone replacement therapy (postmenopausal): Secondary | ICD-10-CM

## 2024-10-20 DIAGNOSIS — Z7901 Long term (current) use of anticoagulants: Secondary | ICD-10-CM

## 2024-10-20 DIAGNOSIS — R296 Repeated falls: Secondary | ICD-10-CM | POA: Diagnosis present

## 2024-10-20 DIAGNOSIS — Z96612 Presence of left artificial shoulder joint: Secondary | ICD-10-CM | POA: Diagnosis present

## 2024-10-20 DIAGNOSIS — R680 Hypothermia, not associated with low environmental temperature: Secondary | ICD-10-CM | POA: Diagnosis present

## 2024-10-20 DIAGNOSIS — M170 Bilateral primary osteoarthritis of knee: Secondary | ICD-10-CM | POA: Diagnosis present

## 2024-10-20 DIAGNOSIS — E785 Hyperlipidemia, unspecified: Secondary | ICD-10-CM | POA: Diagnosis present

## 2024-10-20 DIAGNOSIS — I480 Paroxysmal atrial fibrillation: Secondary | ICD-10-CM | POA: Diagnosis present

## 2024-10-20 DIAGNOSIS — M810 Age-related osteoporosis without current pathological fracture: Secondary | ICD-10-CM | POA: Diagnosis present

## 2024-10-20 DIAGNOSIS — F05 Delirium due to known physiological condition: Secondary | ICD-10-CM | POA: Diagnosis present

## 2024-10-20 DIAGNOSIS — Z66 Do not resuscitate: Secondary | ICD-10-CM | POA: Diagnosis present

## 2024-10-20 DIAGNOSIS — R4182 Altered mental status, unspecified: Secondary | ICD-10-CM

## 2024-10-20 DIAGNOSIS — T68XXXA Hypothermia, initial encounter: Secondary | ICD-10-CM

## 2024-10-20 DIAGNOSIS — E860 Dehydration: Secondary | ICD-10-CM | POA: Diagnosis present

## 2024-10-20 DIAGNOSIS — M545 Low back pain, unspecified: Secondary | ICD-10-CM | POA: Diagnosis present

## 2024-10-20 DIAGNOSIS — F03918 Unspecified dementia, unspecified severity, with other behavioral disturbance: Secondary | ICD-10-CM | POA: Diagnosis present

## 2024-10-20 DIAGNOSIS — I2510 Atherosclerotic heart disease of native coronary artery without angina pectoris: Secondary | ICD-10-CM | POA: Diagnosis present

## 2024-10-20 DIAGNOSIS — E870 Hyperosmolality and hypernatremia: Secondary | ICD-10-CM | POA: Diagnosis present

## 2024-10-20 DIAGNOSIS — Z515 Encounter for palliative care: Secondary | ICD-10-CM

## 2024-10-20 DIAGNOSIS — I130 Hypertensive heart and chronic kidney disease with heart failure and stage 1 through stage 4 chronic kidney disease, or unspecified chronic kidney disease: Principal | ICD-10-CM | POA: Diagnosis present

## 2024-10-20 DIAGNOSIS — E162 Hypoglycemia, unspecified: Secondary | ICD-10-CM | POA: Diagnosis not present

## 2024-10-20 DIAGNOSIS — Z800 Family history of malignant neoplasm of digestive organs: Secondary | ICD-10-CM

## 2024-10-20 DIAGNOSIS — E039 Hypothyroidism, unspecified: Secondary | ICD-10-CM | POA: Diagnosis present

## 2024-10-20 DIAGNOSIS — R339 Retention of urine, unspecified: Secondary | ICD-10-CM | POA: Diagnosis not present

## 2024-10-20 DIAGNOSIS — N1832 Chronic kidney disease, stage 3b: Secondary | ICD-10-CM | POA: Diagnosis present

## 2024-10-20 DIAGNOSIS — Z8673 Personal history of transient ischemic attack (TIA), and cerebral infarction without residual deficits: Secondary | ICD-10-CM

## 2024-10-20 DIAGNOSIS — M4802 Spinal stenosis, cervical region: Secondary | ICD-10-CM | POA: Diagnosis present

## 2024-10-20 DIAGNOSIS — Z801 Family history of malignant neoplasm of trachea, bronchus and lung: Secondary | ICD-10-CM

## 2024-10-20 DIAGNOSIS — S2243XA Multiple fractures of ribs, bilateral, initial encounter for closed fracture: Secondary | ICD-10-CM | POA: Diagnosis present

## 2024-10-20 DIAGNOSIS — S2241XA Multiple fractures of ribs, right side, initial encounter for closed fracture: Secondary | ICD-10-CM | POA: Diagnosis present

## 2024-10-20 DIAGNOSIS — N3281 Overactive bladder: Secondary | ICD-10-CM | POA: Diagnosis present

## 2024-10-20 LAB — CBC WITH DIFFERENTIAL/PLATELET
Abs Immature Granulocytes: 0.04 K/uL (ref 0.00–0.07)
Basophils Absolute: 0 K/uL (ref 0.0–0.1)
Basophils Relative: 0 %
Eosinophils Absolute: 0.1 K/uL (ref 0.0–0.5)
Eosinophils Relative: 3 %
HCT: 29 % — ABNORMAL LOW (ref 36.0–46.0)
Hemoglobin: 9.1 g/dL — ABNORMAL LOW (ref 12.0–15.0)
Immature Granulocytes: 1 %
Lymphocytes Relative: 18 %
Lymphs Abs: 0.8 K/uL (ref 0.7–4.0)
MCH: 30.5 pg (ref 26.0–34.0)
MCHC: 31.4 g/dL (ref 30.0–36.0)
MCV: 97.3 fL (ref 80.0–100.0)
Monocytes Absolute: 0.4 K/uL (ref 0.1–1.0)
Monocytes Relative: 9 %
Neutro Abs: 3.2 K/uL (ref 1.7–7.7)
Neutrophils Relative %: 69 %
Platelets: 272 K/uL (ref 150–400)
RBC: 2.98 MIL/uL — ABNORMAL LOW (ref 3.87–5.11)
RDW: 17.3 % — ABNORMAL HIGH (ref 11.5–15.5)
WBC: 4.6 K/uL (ref 4.0–10.5)
nRBC: 0 % (ref 0.0–0.2)

## 2024-10-20 LAB — CK: Total CK: 132 U/L (ref 38–234)

## 2024-10-20 LAB — URINALYSIS, COMPLETE (UACMP) WITH MICROSCOPIC
Bacteria, UA: NONE SEEN
Bilirubin Urine: NEGATIVE
Glucose, UA: NEGATIVE mg/dL
Hgb urine dipstick: NEGATIVE
Ketones, ur: NEGATIVE mg/dL
Leukocytes,Ua: NEGATIVE
Nitrite: NEGATIVE
Protein, ur: NEGATIVE mg/dL
Specific Gravity, Urine: 1.018 (ref 1.005–1.030)
pH: 5 (ref 5.0–8.0)

## 2024-10-20 LAB — TROPONIN T, HIGH SENSITIVITY
Troponin T High Sensitivity: 63 ng/L — ABNORMAL HIGH (ref 0–19)
Troponin T High Sensitivity: 51 ng/L — ABNORMAL HIGH (ref 0–19)

## 2024-10-20 LAB — COMPREHENSIVE METABOLIC PANEL WITH GFR
ALT: 25 U/L (ref 0–44)
AST: 27 U/L (ref 15–41)
Albumin: 2.9 g/dL — ABNORMAL LOW (ref 3.5–5.0)
Alkaline Phosphatase: 100 U/L (ref 38–126)
Anion gap: 12 (ref 5–15)
BUN: 32 mg/dL — ABNORMAL HIGH (ref 8–23)
CO2: 19 mmol/L — ABNORMAL LOW (ref 22–32)
Calcium: 9.5 mg/dL (ref 8.9–10.3)
Chloride: 113 mmol/L — ABNORMAL HIGH (ref 98–111)
Creatinine, Ser: 1.84 mg/dL — ABNORMAL HIGH (ref 0.44–1.00)
GFR, Estimated: 27 mL/min — ABNORMAL LOW
Glucose, Bld: 83 mg/dL (ref 70–99)
Potassium: 4.6 mmol/L (ref 3.5–5.1)
Sodium: 145 mmol/L (ref 135–145)
Total Bilirubin: 0.2 mg/dL (ref 0.0–1.2)
Total Protein: 5.6 g/dL — ABNORMAL LOW (ref 6.5–8.1)

## 2024-10-20 LAB — ECHOCARDIOGRAM COMPLETE
AR max vel: 1.96 cm2
AV Area VTI: 1.97 cm2
AV Area mean vel: 1.93 cm2
AV Mean grad: 7 mmHg
AV Peak grad: 13.4 mmHg
Ao pk vel: 1.83 m/s
Area-P 1/2: 3.43 cm2
Height: 60 in
S' Lateral: 2.4 cm
Weight: 2292.78 [oz_av]

## 2024-10-20 LAB — PROTIME-INR
INR: 1.7 — ABNORMAL HIGH (ref 0.8–1.2)
Prothrombin Time: 20.4 s — ABNORMAL HIGH (ref 11.4–15.2)

## 2024-10-20 LAB — T4, FREE: Free T4: 0.78 ng/dL — ABNORMAL LOW (ref 0.80–2.00)

## 2024-10-20 LAB — TSH: TSH: 4.46 u[IU]/mL (ref 0.350–4.500)

## 2024-10-20 MED ORDER — ONDANSETRON HCL 4 MG/2ML IJ SOLN: 4.0000 mg | Freq: Four times a day (QID) | INTRAMUSCULAR | Status: DC | PRN

## 2024-10-20 MED ORDER — QUETIAPINE FUMARATE 25 MG PO TABS
25.0000 mg | ORAL_TABLET | Freq: Every day | ORAL | Status: DC
  Administered 2024-10-21 – 2024-10-31 (×9): 25 mg via ORAL
  Filled 2024-10-20 (×11): qty 1

## 2024-10-20 MED ORDER — SENNOSIDES-DOCUSATE SODIUM 8.6-50 MG PO TABS: 1.0000 | ORAL_TABLET | Freq: Every evening | ORAL | Status: DC | PRN

## 2024-10-20 MED ORDER — SODIUM CHLORIDE 0.9 % IV BOLUS
500.0000 mL | Freq: Once | INTRAVENOUS | Status: AC
  Administered 2024-10-20: 500 mL via INTRAVENOUS

## 2024-10-20 MED ORDER — MIRTAZAPINE 15 MG PO TABS
7.5000 mg | ORAL_TABLET | Freq: Every day | ORAL | Status: DC
  Filled 2024-10-20: qty 1

## 2024-10-20 MED ORDER — HEPARIN SODIUM (PORCINE) 5000 UNIT/ML IJ SOLN
5000.0000 [IU] | Freq: Two times a day (BID) | INTRAMUSCULAR | Status: DC
  Administered 2024-10-21: 5000 [IU] via SUBCUTANEOUS
  Filled 2024-10-20 (×2): qty 1

## 2024-10-20 MED ORDER — LORAZEPAM 2 MG/ML IJ SOLN
0.5000 mg | Freq: Four times a day (QID) | INTRAMUSCULAR | Status: DC | PRN
  Administered 2024-10-22: 0.5 mg via INTRAVENOUS
  Filled 2024-10-20: qty 1

## 2024-10-20 MED ORDER — LIDOCAINE 5 % EX PTCH
1.0000 | MEDICATED_PATCH | CUTANEOUS | Status: DC
  Administered 2024-10-20 – 2024-11-01 (×13): 1 via TRANSDERMAL
  Filled 2024-10-20 (×13): qty 1

## 2024-10-20 MED ORDER — FUROSEMIDE 10 MG/ML IJ SOLN
20.0000 mg | Freq: Every day | INTRAMUSCULAR | Status: DC
  Administered 2024-10-20: 20 mg via INTRAVENOUS
  Filled 2024-10-20: qty 4

## 2024-10-20 MED ORDER — LEVOTHYROXINE SODIUM 50 MCG PO TABS
75.0000 ug | ORAL_TABLET | Freq: Every day | ORAL | Status: DC
  Administered 2024-10-21: 75 ug via ORAL
  Filled 2024-10-20: qty 2

## 2024-10-20 MED ORDER — ACETAMINOPHEN 325 MG PO TABS
650.0000 mg | ORAL_TABLET | Freq: Four times a day (QID) | ORAL | Status: DC | PRN
  Administered 2024-10-29: 650 mg via ORAL
  Filled 2024-10-20 (×2): qty 2

## 2024-10-20 MED ORDER — FESOTERODINE FUMARATE ER 4 MG PO TB24
4.0000 mg | ORAL_TABLET | Freq: Every day | ORAL | Status: DC
  Administered 2024-10-21: 4 mg via ORAL
  Filled 2024-10-20 (×2): qty 1

## 2024-10-20 MED ORDER — ROSUVASTATIN CALCIUM 5 MG PO TABS
5.0000 mg | ORAL_TABLET | Freq: Every day | ORAL | Status: DC
  Administered 2024-10-21: 5 mg via ORAL
  Filled 2024-10-20 (×2): qty 1

## 2024-10-20 MED ORDER — ONDANSETRON HCL 4 MG PO TABS: 4.0000 mg | ORAL_TABLET | Freq: Four times a day (QID) | ORAL | Status: DC | PRN

## 2024-10-20 MED ORDER — ACETAMINOPHEN 650 MG RE SUPP: 650.0000 mg | Freq: Four times a day (QID) | RECTAL | Status: DC | PRN

## 2024-10-20 MED ORDER — TRAZODONE HCL 50 MG PO TABS: 25.0000 mg | ORAL_TABLET | Freq: Every evening | ORAL | Status: DC | PRN

## 2024-10-20 MED ORDER — HYDROMORPHONE HCL 1 MG/ML IJ SOLN: 0.5000 mg | INTRAMUSCULAR | Status: DC | PRN

## 2024-10-20 NOTE — ED Notes (Signed)
 Pt brief changed and adjusted in bed.

## 2024-10-20 NOTE — Progress Notes (Signed)
 [facility] Hospital Liaison Note  Ms. Laura Bishop is a current [facility] patient followed by outpatient Palliative Care.  Please call with any questions regarding patient's discharge needs.  Saddie HILARIO Na, RN Nurse Liaison [phone]

## 2024-10-20 NOTE — ED Provider Notes (Signed)
 "  [facility] Provider Note    Event Date/Time   First MD Initiated Contact with Patient 10/20/24 [phone]     (approximate)   History   Fall  Pt to ED via [facility] from home for a fall. Pt fell last night at 10pm and has been in the floor since then. Pt c/o left hip pain and pain on the right side of her head. Per family she is at her baseline. Pt able to stand with assistance on scene.    HPI Laura GROSECLOSE is a 84 y.o. female PMH CKD, hypertension, hyperlipidemia, iron deficiency anemia, meningioma, prior UTIs presents for evaluation after fall, weakness - Patient is a noncontributory historian.  Per EMS, she is at her baseline mental state.  Reportedly fell last night at about 10 PM and had a fall earlier in the day as well.  Had been on the floor since that time unable to get up.  Patient was able to stand with assistance on scene.  Had been complaining of left hip pain as well as pain in her head.  Collateral later gathered from husband, see ED course below.  In brief summary, worsening altered mental status and worsening frequency of falls, intermittently agitated.  Fell last night around 10:30 pm and was unable to get up.  No recent infectious symptoms.  Per chart review, patient was seen in cardiology clinic on 10/16/2024.  Noted to have frequent falls, recurrent syncope, bradycardia, recent A-fib during hospitalization for fall and humerus fracture.  Started on low-dose Eliquis .  Holter monitor with predominant sinus bradycardia, 1% A-fib burden.  Noted to have slight cognitive impairment and worsening altered mental status per husband at that time and another fall at that time.  Head CT in the outpatient setting negative.  Gabapentin and Remeron  discontinued, UA also ordered.  Deferring pacemaker placement.  Recommend consideration of possibly discontinuing Eliquis .      Physical Exam   Triage Vital Signs: ED Triage Vitals  Encounter Vitals Group     BP  10/20/24 0951 118/63     Girls Systolic BP Percentile --      Girls Diastolic BP Percentile --      Boys Systolic BP Percentile --      Boys Diastolic BP Percentile --      Pulse Rate 10/20/24 0951 64     Resp 10/20/24 0951 16     Temp --      Temp src --      SpO2 10/20/24 0951 97 %     Weight 10/20/24 0953 143 lb 4.8 oz (65 kg)     Height 10/20/24 0953 5' (1.524 m)     Head Circumference --      Peak Flow --      Pain Score 10/20/24 0952 10     Pain Loc --      Pain Education --      Exclude from Growth Chart --     Most recent vital signs: Vitals:   10/20/24 1520 10/20/24 1558  BP:  128/69  Pulse:  81  Resp:  20  Temp: (!) 94.3 F (34.6 C)   SpO2:  100%     General: Awake, no distress.  HEENT: Normocephalic, atraumatic, no clear midline tenderness CV:  Good peripheral perfusion. RRR, RP 2+ Resp:  Normal effort. CTAB Abd:  No distention. Nontender to deep palpation throughout Neuro:  Alert, unclear orientation, moving all extremity spontaneously, not otherwise interactive with neurologic  exam but noted clear deficit appreciated. Other:  Mild tenderness palpation over left hip.  Able to range.  No limb length discrepancy nor abnormal rotation.  No tenderness elsewhere throughout bilateral upper and lower extremities.   ED Results / Procedures / Treatments   Labs (all labs ordered are listed, but only abnormal results are displayed) Labs Reviewed  CBC WITH DIFFERENTIAL/PLATELET - Abnormal; Notable for the following components:      Result Value   RBC 2.98 (*)    Hemoglobin 9.1 (*)    HCT 29.0 (*)    RDW 17.3 (*)    All other components within normal limits  PROTIME-INR - Abnormal; Notable for the following components:   Prothrombin Time 20.4 (*)    INR 1.7 (*)    All other components within normal limits  COMPREHENSIVE METABOLIC PANEL WITH GFR - Abnormal; Notable for the following components:   Chloride 113 (*)    CO2 19 (*)    BUN 32 (*)    Creatinine,  Ser 1.84 (*)    Total Protein 5.6 (*)    Albumin 2.9 (*)    GFR, Estimated 27 (*)    All other components within normal limits  URINALYSIS, COMPLETE (UACMP) WITH MICROSCOPIC - Abnormal; Notable for the following components:   Color, Urine YELLOW (*)    APPearance CLEAR (*)    All other components within normal limits  T4, FREE - Abnormal; Notable for the following components:   Free T4 0.78 (*)    All other components within normal limits  TROPONIN T, HIGH SENSITIVITY - Abnormal; Notable for the following components:   Troponin T High Sensitivity 63 (*)    All other components within normal limits  TROPONIN T, HIGH SENSITIVITY - Abnormal; Notable for the following components:   Troponin T High Sensitivity 51 (*)    All other components within normal limits  CK  TSH  COXSACKIE A VIRUS ANTIBODIES  PRO BRAIN NATRIURETIC PEPTIDE     EKG  See ED course below.   RADIOLOGY Radiology interpreted by myself and radiology report reviewed.  Notable for multiple right-sided rib fractures and single left sided rib fracture.  CT C-spine with some incidental T-spine fractures as well as some prevertebral edema suggestive of possible ligamentous injury.    PROCEDURES:  Critical Care performed: No  Procedures   MEDICATIONS ORDERED IN ED: Medications  HYDROmorphone  (DILAUDID ) injection 0.5 mg (has no administration in time range)  LORazepam  (ATIVAN ) injection 0.5 mg (has no administration in time range)  rosuvastatin  (CRESTOR ) tablet 5 mg (has no administration in time range)  mirtazapine  (REMERON ) tablet 7.5 mg (has no administration in time range)  QUEtiapine  (SEROQUEL ) tablet 25 mg (has no administration in time range)  levothyroxine  (SYNTHROID ) tablet 75 mcg (has no administration in time range)  fesoterodine  (TOVIAZ ) tablet 4 mg (has no administration in time range)  heparin  injection 5,000 Units (has no administration in time range)  acetaminophen  (TYLENOL ) tablet 650 mg (has  no administration in time range)    Or  acetaminophen  (TYLENOL ) suppository 650 mg (has no administration in time range)  traZODone  (DESYREL ) tablet 25 mg (has no administration in time range)  ondansetron  (ZOFRAN ) tablet 4 mg (has no administration in time range)    Or  ondansetron  (ZOFRAN ) injection 4 mg (has no administration in time range)  senna-docusate (Senokot-S) tablet 1 tablet (has no administration in time range)  lidocaine  (LIDODERM ) 5 % 1 patch (1 patch Transdermal Patch Applied 10/20/24 1514)  furosemide  (LASIX ) injection 20 mg (20 mg Intravenous Given 10/20/24 1515)  sodium chloride  0.9 % bolus 500 mL (0 mLs Intravenous Stopped 10/20/24 1344)     IMPRESSION / MDM / ASSESSMENT AND PLAN / ED COURSE  I reviewed the triage vital signs and the nursing notes.                              DDX/MDM/AP: Differential diagnosis includes, but is not limited to, electrolyte abnormality, consider underlying infection including UTI, pneumonia.  Consider intracranial hemorrhage, skull fracture, C-spine injury.  Consider left hip fracture though initial exam overall reassuring.  Broad differential for underlying infection or traumatic injuries.  Plan: - Labs - X-ray pelvis/left hip, chest x-ray, CT head, CT C-spine - CT CAP - EKG - Clarified goals of care with husband, confirms DNR/DNI with limited interventions, anticipate admission  Patient's presentation is most consistent with acute presentation with potential threat to life or bodily function.  The patient is on the cardiac monitor to evaluate for evidence of arrhythmia and/or significant heart rate changes.  ED course below.  Workup for bilateral rib fractures, nonspecific C-spine findings suggestive of possibility of ligamentous injury, discussed with neurosurgery, recommends MRI, no clear indication for c-collar in this elderly patient with dementia at this time and no obvious neck pain.  Incidental findings of T-spine fractures.   Admitted to hospitalist service.  Clinical Course as of 10/20/24 1625  [place] Oct 20, 2024  1006 Spoke w/ husband -- 2-3 falls yesterday. Usually falls 1-2 times daily. - not acting like herself -- confused, does not recognize him, somewhat agitated. Several weeks to months.  - believes she has underlying dementia  - confirms DNR / DNI status - no recent infectious symptoms - Cannot take care of her and her current clinical state [MM]  1043 CBC with no leukocytosis, mild improved anemia [MM]  1052 CBC with AKI on CKD, somewhat low bicarb.  Received 500 cc fluid bolus.  CK normal. [MM]  1128 Troponin very mildly elevated and similar to baseline --doubt ACS.  No clear complaints of chest pain.  Will trend.   [MM]  1129 Ecg = sinus rhythm, rate 60, no gross ST elevation or depression, no significant repolarization abnormality, left axis deviation, normal intervals except for prolonged Qtc (510).  No clear evidence of ischemia nor arrhythmia my interpretation. [MM]  1129 XR L hip: IMPRESSION: 1. No evidence of acute traumatic injury.   [MM]  1130 CXR: IMPRESSION: 1. Suspicion of right lateral rib fractures with overlying pleural thickening or fluid. CT is pending. 2. Cardiomegaly without congestive heart failure.   [MM]  1159 Rectal temperature 91.1, Bair hugger initiated  No clear evidence of infection at this time, will defer abx  Known history of hypothyroidism, will add thyroid  studies [MM]  1229 CTH: IMPRESSION: 1. Right vertex mild scalp soft tissue injury. 2. No acute intracranial abnormality identified. Small chronic right thalamic lacunar infarct.   [MM]  1231 CT CAP: IMPRESSION: 1. Nondisplaced buckle type fractures of the anterior right 4 to 6 ribs and mildly displaced fracture of the left lateral 11th rib. 2. Small bilateral pleural effusions with bibasilar compressive atelectasis. No pneumothorax. 3. Left femoral hernia containing a couple of segments of small  bowel. No findings of vascular compromise or upstream bowel obstruction. 4. Moderate cardiomegaly and moderate volume pericardial effusion.   [MM]  1232 Hospitalist consult order placed [MM]  1232 UA w/  no e/o infxn [MM]  1311 CT Cspine: IMPRESSION: 1. Positive for mild prevertebral edema from C2 to C4, compatible with acute anterior ligamentous injury in this setting. No superimposed acute cervical spine fracture. 2. New since October and age indeterminant T3 (22% loss of vertebral height) and subtle T2 superior endplate compression fractures. No retropulsion or complicating features.   [MM]  1320 D/w Dr. Katrina of NSGY Unclear if ligamentous injury may be present, recommends escalation to MRI  Given overall goals of care  from discussion with family earlier, if present would consider c-collar. [MM]    Clinical Course User Index [MM] Clarine Ozell LABOR, MD     FINAL CLINICAL IMPRESSION(S) / ED DIAGNOSES   Final diagnoses:  Fall, initial encounter  Multiple fractures of ribs, bilateral, initial encounter for closed fracture  Altered mental status, unspecified altered mental status type  Hypothermia, initial encounter     Rx / DC Orders   ED Discharge Orders     None        Note:  This document was prepared using Dragon voice recognition software and may include unintentional dictation errors.   Clarine Ozell LABOR, MD 10/20/24 1625  "

## 2024-10-20 NOTE — Consult Note (Signed)
 " CARDIOLOGY CONSULT NOTE               Patient ID: Laura Bishop MRN: [id number] DOB/AGE: 1941-09-08 84 y.o.  Admit date: 10/20/2024 Referring Physician Dr. Cort Mana hospitalist Primary Physician Dr. Sherial primary Primary Cardiologist  Reason for Consultation pericardial effusion paroxysmal atrial fibrillation  HPI: 84 year old female with advanced dementia hypertension hyperlipidemia renal insufficiency previous CVA brought to the emergency room because of frequent falls appeared to have multiple injuries from falls she was found to be hypothermic as well symptoms seem to be getting worse she has been on anticoagulation for A-fib she has had renal insufficiency but with advanced dementia that she has become total care unable to manage her own ADLs multiple fractures on imaging as well as possible pericardial effusion on CT unable to give much of a history she has been slightly bradycardic and hypothermic with borderline hypotension cardiology was consulted for further management and care of potential evaluation of pericardial effusion  Review of systems complete and found to be negative unless listed above     Past Medical History:  Diagnosis Date   Chronic kidney disease    chronic kidney disease stage III   Chronic lower back pain    Hyperlipidemia    Hypertension    Hypothyroidism    IDA (iron deficiency anemia)    Meningioma (HCC)    Osteoarthritis of both knees    osteoarthritis of knee   Osteoporosis    Overactive bladder    Vitamin D  deficiency     Past Surgical History:  Procedure Laterality Date   ABDOMINAL HYSTERECTOMY     APPENDECTOMY     BACK SURGERY     COLONOSCOPY WITH PROPOFOL  N/A 07/20/2016   Procedure: COLONOSCOPY WITH PROPOFOL ;  Surgeon: Lamar ONEIDA Holmes, MD;  Location: [facility] ENDOSCOPY;  Service: Endoscopy;  Laterality: N/A;   COLONOSCOPY WITH PROPOFOL  N/A 02/27/2019   Procedure: COLONOSCOPY WITH PROPOFOL ;  Surgeon: Holmes Lamar ONEIDA, MD;   Location: [facility] ENDOSCOPY;  Service: Endoscopy;  Laterality: N/A;   ESOPHAGOGASTRODUODENOSCOPY (EGD) WITH PROPOFOL  N/A 07/20/2016   Procedure: ESOPHAGOGASTRODUODENOSCOPY (EGD) WITH PROPOFOL ;  Surgeon: Lamar ONEIDA Holmes, MD;  Location: [facility] ENDOSCOPY;  Service: Endoscopy;  Laterality: N/A;   ESOPHAGOGASTRODUODENOSCOPY (EGD) WITH PROPOFOL  N/A 01/29/2020   Procedure: ESOPHAGOGASTRODUODENOSCOPY (EGD) WITH PROPOFOL ;  Surgeon: Toledo, Ladell POUR, MD;  Location: [facility] ENDOSCOPY;  Service: Gastroenterology;  Laterality: N/A;   EYE SURGERY     extraction cataract   lobectomy total thyroid       (Not in a hospital admission)  Social History   Socioeconomic History   Marital status: Married    Spouse name: Not on file   Number of children: Not on file   Years of education: Not on file   Highest education level: Not on file  Occupational History   Not on file  Tobacco Use   Smoking status: Never   Smokeless tobacco: Never  Vaping Use   Vaping status: Never Used  Substance and Sexual Activity   Alcohol use: No   Drug use: No   Sexual activity: Not Currently  Other Topics Concern   Not on file  Social History Narrative   Not on file   Social Drivers of Health   Tobacco Use: Low Risk (10/20/2024)   Patient History    Smoking Tobacco Use: Never    Smokeless Tobacco Use: Never    Passive Exposure: Not on file  Recent Concern: Tobacco Use - Medium Risk (10/18/2024)   Received  from [facility]   Patient History    Smoking Tobacco Use: Never    Smokeless Tobacco Use: Never    Passive Exposure: Yes  Financial Resource Strain: Low Risk  (10/16/2024)   Received from [facility]   Overall Financial Resource Strain (CARDIA)    Difficulty of Paying Living Expenses: Not hard at all  Food Insecurity: No Food Insecurity (10/16/2024)   Received from [facility]   Epic    Within the past 12 months, you worried that your food would run out before you got  the money to buy more.: Never true    Within the past 12 months, the food you bought just didn't last and you didn't have money to get more.: Never true  Transportation Needs: No Transportation Needs (10/16/2024)   Received from [facility]    In the past 12 months, has lack of transportation kept you from medical appointments or from getting medications?: No    Lack of Transportation (Non-Medical): No  Physical Activity: Not on file  Stress: Not on file  Social Connections: Patient Unable To Answer (09/30/2024)   Social Connection and Isolation Panel    Frequency of Communication with Friends and Family: Patient unable to answer    Frequency of Social Gatherings with Friends and Family: Patient unable to answer    Attends Religious Services: Patient unable to answer    Active Member of Clubs or Organizations: Patient unable to answer    Attends Banker Meetings: Patient unable to answer    Marital Status: Patient unable to answer  Intimate Partner Violence: Patient Unable To Answer (09/30/2024)   Epic    Fear of Current or Ex-Partner: Patient unable to answer    Emotionally Abused: Patient unable to answer    Physically Abused: Patient unable to answer    Sexually Abused: Patient unable to answer  Depression (PHQ2-9): Not on file  Alcohol Screen: Not on file  Housing: Low Risk  (10/16/2024)   Received from [facility]   Epic    In the last 12 months, was there a time when you were not able to pay the mortgage or rent on time?: No    In the past 12 months, how many times have you moved where you were living?: 0    At any time in the past 12 months, were you homeless or living in a shelter (including now)?: No  Utilities: Not At Risk (10/16/2024)   Received from [facility]   Epic    In the past 12 months has the electric, gas, oil, or water company threatened to shut off services in your home?:  No  Health Literacy: Not on file    Family History  Problem Relation Age of Onset   Stomach cancer Mother    Lung cancer Father       Review of systems complete and found to be negative unless listed above      PHYSICAL EXAM  General: Well developed, well nourished, in no acute distress HEENT:  Normocephalic and atramatic Neck:  No JVD.  Lungs: Clear bilaterally to auscultation and percussion. Heart: HRRR . Normal S1 and S2 without gallops or murmurs.  Abdomen: Bowel sounds are positive, abdomen soft and non-tender  Msk:  Back normal, normal gait. Normal strength and tone for age. Extremities: No clubbing, cyanosis or edema.   Neuro: Alert and oriented X  3. Psych:  Good affect, responds appropriately  Labs:   Lab Results  Component Value Date   WBC 4.6 10/20/2024   HGB 9.1 (L) 10/20/2024   HCT 29.0 (L) 10/20/2024   MCV 97.3 10/20/2024   PLT 272 10/20/2024    Recent Labs  Lab 10/20/24 1012  NA 145  K 4.6  CL 113*  CO2 19*  BUN 32*  CREATININE 1.84*  CALCIUM  9.5  PROT 5.6*  BILITOT 0.2  ALKPHOS 100  ALT 25  AST 27  GLUCOSE 83   Lab Results  Component Value Date   CKTOTAL 132 10/20/2024   TROPONINI <0.03 05/27/2018   No results found for: CHOL No results found for: HDL No results found for: LDLCALC No results found for: TRIG No results found for: CHOLHDL No results found for: LDLDIRECT    Radiology: MR CERVICAL SPINE WO CONTRAST Result Date: 10/20/2024 EXAM: MRI CERVICAL SPINE WITHOUT CONTRAST 10/20/2024 03:54:58 PM TECHNIQUE: Multiplanar multisequence MRI of the cervical spine was performed. COMPARISON: CT cervical spine 10/20/2024. CLINICAL HISTORY: Spine fracture, cervical, traumatic. Fall. FINDINGS: LIMITATIONS/ARTIFACTS: The examination is motion degraded, including moderate to severe motion on the sagittal STIR and both axial sequences. BONES AND ALIGNMENT: Exaggerated upper thoracic kyphosis and lower cervical lordosis. T3 superior  endplate compression fracture with 25% vertebral body height loss and moderate marrow edema. No gross fracture or marrow edema in the cervical spine with assessment limited by motion artifact. SPINAL CORD: Assessment of the spinal cord is limited by motion, however no cord edema is evident on the sagittal T2 sequence which is only mildly degraded. No visible epidural hematoma. SOFT TISSUES: Mild prevertebral edema greatest from C1 to C3. No discrete anterior longitudinal ligament disruption on this motion integrated examination. Moderate marrow edema throughout the right greater than left posterior paraspinal soft tissues. DISC LEVELS: Detailed assessment of degenerative changes is limited by motion. Disc bulging and infolding of the ligamentum flavum result in mild spinal stenosis at C3-C4, moderate spinal stenosis at C4-C5, and severe spinal stenosis at C5-C6 and C6-C7 with at least mild spinal cord mass effect at C5-C6 and C6-C7. IMPRESSION: 1. Motion degraded examination. 2. Mild prevertebral edema and moderate posterior paraspinal soft tissue edema. No discrete ligamentous disruption or epidural tumor identified within study limitations. 3. Acute T3 compression fracture with 25% vertebral body height loss. 4. Suspected severe spinal stenosis at C5-C6 and C6-C7 and moderate spinal stenosis at C4-C5. Electronically signed by: Dasie Hamburg MD MD 10/20/2024 04:33 PM EST RP Workstation: [id number]   MR LUMBAR SPINE WO CONTRAST Result Date: 10/20/2024 EXAM: MRI LUMBAR SPINE 10/20/2024 03:54:58 PM TECHNIQUE: Multiplanar multisequence MRI of the lumbar spine was performed without the administration of intravenous contrast. COMPARISON: CT chest, abdomen, and pelvis 10/20/2024. CLINICAL HISTORY: Low back pain, increased fracture risk. Fall. Left hip pain. FINDINGS: BONES AND ALIGNMENT: 5 lumbar type of vertebrae. Mild lumbar dextroscoliosis. Trace anterolisthesis of L3, L4, and L5 on S1. Partially visualized marrow  edema in the sacral ala bilaterally and extending transversely through the anterior S3 segment consistent with acute fractures. Normal vertebral body heights. SPINAL CORD: The conus medullaris terminates at L1-L2 and is normal in signal. 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 Mild presacral soft tissue swelling/fluid. The abdominal and pelvic viscera were more fully evaluated on today's CT. DISC LEVELS: Disc desiccation throughout the lumbar spine. Severe disc space narrowing at L5-S1. T12-L1: Small central disc protrusion without stenosis. L1-L2: Shallow central disc protrusion with annular fissure and mild facet and  ligamentum flavum hypertrophy without stenosis. L2-L3: Mild disc bulging and mild facet hypertrophy and mild stenosis. L3-L4: Anterolisthesis with bulging of uncovered disc and moderate facet hypertrophy without stenosis. Right foraminal to extraforaminal annular fissure. L4-L5: Mild disc bulging and moderate facet hypertrophy without stenosis. Small bilateral foraminal annular fissures. L5-S1: Anterolisthesis with bulging of uncovered disc and severe facet hypertrophy result in mild bilateral neural foraminal stenosis without spinal stenosis. IMPRESSION: 1. Acute bilateral sacral fractures. 2. Multilevel lumbar disc and facet degeneration without high-grade stenosis. Electronically signed by: Dasie Hamburg MD MD 10/20/2024 04:20 PM EST RP Workstation: [id number]   ECHOCARDIOGRAM COMPLETE Result Date: 10/20/2024    ECHOCARDIOGRAM REPORT   Patient Name:   Laura Bishop Date of Exam: 10/20/2024 Medical Rec #:  [id number]        Height:       60.0 in Accession #:    [id number]       Weight:       143.3 lb Date of Birth:  [birth year]        BSA:          1.620 m Patient Age:    83 years         BP:           122/76 mmHg Patient Gender: F                HR:           72 bpm. Exam Location:  [facility] Procedure: 2D Echo, Cardiac Doppler and Color Doppler (Both Spectral and Color            Flow Doppler were  utilized during procedure).                                 MODIFIED REPORT:    This report was modified by Cara JONETTA Lovelace MD on 10/20/2024 due to small                              pericardial effusion.  Indications:     Pericardial Effusion  History:         Patient has no prior history of Echocardiogram examinations.                  CKD.  Sonographer:     Philomena Daring Referring Phys:  [id number] CORT ONEIDA MANA Diagnosing Phys: Cara JONETTA Lovelace MD  Sonographer Comments: Image acquisition challenging due to uncooperative patient. IMPRESSIONS  1. Left ventricular ejection fraction, by estimation, is 60 to 65%. The left ventricle has normal function. The left ventricle has no regional wall motion abnormalities. Left ventricular diastolic parameters are consistent with Grade I diastolic dysfunction (impaired relaxation).  2. Right ventricular systolic function is mildly reduced. The right ventricular size is mildly enlarged.  3. A small pericardial effusion is present. The pericardial effusion is anterior to the right ventricle, surrounding the apex and localized near the right atrium.  4. The mitral valve is normal in structure. No evidence of mitral valve regurgitation.  5. The aortic valve is normal in structure. Aortic valve regurgitation is trivial.  6. Aortic dilatation noted. There is mild dilatation of the ascending aorta, measuring 40 mm. FINDINGS  Left Ventricle: Left ventricular ejection fraction, by estimation, is 60 to 65%. The left ventricle has normal function. The left ventricle has no regional wall motion abnormalities. Strain  was performed and the global longitudinal strain is indeterminate. The left ventricular internal cavity size was normal in size. There is borderline left ventricular hypertrophy. Left ventricular diastolic parameters are consistent with Grade I diastolic dysfunction (impaired relaxation). Right Ventricle: The right ventricular size is mildly enlarged. No increase in right  ventricular wall thickness. Right ventricular systolic function is mildly reduced. Left Atrium: Left atrial size was normal in size. Right Atrium: Right atrial size was normal in size. Pericardium: A small pericardial effusion is present. The pericardial effusion is anterior to the right ventricle, surrounding the apex and localized near the right atrium. Mitral Valve: The mitral valve is normal in structure. No evidence of mitral valve regurgitation. Tricuspid Valve: The tricuspid valve is normal in structure. Tricuspid valve regurgitation is trivial. Aortic Valve: The aortic valve is normal in structure. Aortic valve regurgitation is trivial. Aortic valve mean gradient measures 7.0 mmHg. Aortic valve peak gradient measures 13.4 mmHg. Aortic valve area, by VTI measures 1.97 cm. Pulmonic Valve: The pulmonic valve was normal in structure. Pulmonic valve regurgitation is not visualized. Aorta: Aortic dilatation noted. There is mild dilatation of the ascending aorta, measuring 40 mm. IAS/Shunts: No atrial level shunt detected by color flow Doppler. Additional Comments: 3D was performed not requiring image post processing on an independent workstation and was indeterminate.  LEFT VENTRICLE PLAX 2D LVIDd:         3.70 cm   Diastology LVIDs:         2.40 cm   LV e' medial:    6.31 cm/s LV PW:         1.00 cm   LV E/e' medial:  8.9 LV IVS:        1.00 cm   LV e' lateral:   8.05 cm/s LVOT diam:     2.00 cm   LV E/e' lateral: 7.0 LV SV:         62 LV SV Index:   38 LVOT Area:     3.14 cm  IVC IVC diam: 1.50 cm LEFT ATRIUM             Index        RIGHT ATRIUM           Index LA diam:        2.70 cm 1.67 cm/m   RA Area:     11.50 cm LA Vol (A2C):   34.7 ml 21.42 ml/m  RA Volume:   23.20 ml  14.32 ml/m LA Vol (A4C):   55.1 ml 34.01 ml/m LA Biplane Vol: 46.1 ml 28.46 ml/m  AORTIC VALVE AV Area (Vmax):    1.96 cm AV Area (Vmean):   1.93 cm AV Area (VTI):     1.97 cm AV Vmax:           183.00 cm/s AV Vmean:           120.000 cm/s AV VTI:            0.314 m AV Peak Grad:      13.4 mmHg AV Mean Grad:      7.0 mmHg LVOT Vmax:         114.00 cm/s LVOT Vmean:        73.900 cm/s LVOT VTI:          0.197 m LVOT/AV VTI ratio: 0.63  AORTA Ao Root diam: 3.10 cm MITRAL VALVE               TRICUSPID VALVE MV  Area (PHT): 3.43 cm    TR Peak grad:   24.0 mmHg MV Decel Time: 221 msec    TR Vmax:        245.00 cm/s MV E velocity: 56.20 cm/s MV A velocity: 98.50 cm/s  SHUNTS MV E/A ratio:  0.57        Systemic VTI:  0.20 m                            Systemic Diam: 2.00 cm Cara JONETTA Lovelace MD Electronically signed by Cara JONETTA Lovelace MD Signature Date/Time: 10/20/2024/2:58:54 PM    Final (Updated)    CT Cervical Spine Wo Contrast Addendum Date: 10/20/2024 ADDENDUM: Study discussed by telephone with Doctor Clarine at 13:12 on October 20, 2024 Electronically signed by: Helayne Hurst MD MD 10/20/2024 01:23 PM EST RP Workstation: [id number]   Result Date: 10/20/2024 EXAM: CT CERVICAL SPINE WITHOUT CONTRAST 10/20/2024 11:14:49 AM TECHNIQUE: CT of the cervical spine was performed without the administration of intravenous contrast. Multiplanar reformatted images are provided for review. Automated exposure control, iterative reconstruction, and/or weight based adjustment of the mA/kV was utilized to reduce the radiation dose to as low as reasonably achievable. COMPARISON: Head CT 10/20/2024, reported separately. Cervical spine CT 07/28/2024. CT chest 10/20/2024, reported separately. CLINICAL HISTORY: 84 year old female with recurrent falls, found down. FINDINGS: BONES AND ALIGNMENT: Stable chronic straightening of cervical lordosis. No acute traumatic malalignment. No acute vertebral fracture is identified in the cervical spine. T3 superior endplate compression fracture is new since October, age indeterminate. This represents 22% loss of vertebral body height. No retropulsion. Posterior elements of T3 appear intact and aligned. More subtle compression of  the T2 superior endplate also appears new since October. Posterior elements of T2 appear intact and aligned. DEGENERATIVE CHANGES: Mild for age cervical spine degeneration appears stable. Ordinary cervical carotid calcified atherosclerosis. SOFT TISSUES: Positive for mild new prevertebral edema (series 4 image 42) most pronounced from the C2-C3 through the right C3-C4 levels. Otherwise negative visible non-contrast neck soft tissues. No CT evidence of cervical spinal canal hemorrhage. IMPRESSION: 1. Positive for mild prevertebral edema from C2 to C4, compatible with acute anterior ligamentous injury in this setting. No superimposed acute cervical spine fracture. 2. New since October and age indeterminant T3 (22% loss of vertebral height) and subtle T2 superior endplate compression fractures. No retropulsion or complicating features. Electronically signed by: Helayne Hurst MD MD 10/20/2024 01:06 PM EST RP Workstation: [id number]   CT CHEST ABDOMEN PELVIS WO CONTRAST Result Date: 10/20/2024 EXAM: CT CHEST, ABDOMEN AND PELVIS WITHOUT CONTRAST 10/20/2024 11:14:49 AM TECHNIQUE: CT of the chest, abdomen and pelvis was performed without the administration of intravenous contrast. Multiplanar reformatted images are provided for review. Automated exposure control, iterative reconstruction, and/or weight based adjustment of the mA/kV was utilized to reduce the radiation dose to as low as reasonably achievable. COMPARISON: 01/19/2020 CLINICAL HISTORY: Recurrent falls, weakness, abdominal pain --evaluate for traumatic injury, underlying infection or source of pain. FINDINGS: CHEST: MEDIASTINUM AND LYMPH NODES: Moderate cardiomegaly. Dense multivessel coronary atherosclerosis. Moderate volume pericardial effusion. The central airways are clear. No mediastinal, hilar or axillary lymphadenopathy. LUNGS AND PLEURA: Small bilateral pleural effusions with bibasilar compressive atelectasis. No focal consolidation or pulmonary edema.  No pneumothorax. ABDOMEN AND PELVIS: LIVER: Unremarkable. GALLBLADDER AND BILE DUCTS: Unremarkable. No biliary ductal dilatation. SPLEEN: No acute abnormality. PANCREAS: Pancreatic parenchymal atrophy. ADRENAL GLANDS: No acute abnormality. KIDNEYS, URETERS AND BLADDER: Unchanged appearance of bilateral renal  cysts, a couple of which are proteinaceous or hemorrhagic. Diffuse renal cortical atrophy on the left, slightly worse in the interim. No stones in the kidneys or ureters. No hydronephrosis. No perinephric or periureteral stranding. Urinary bladder is unremarkable. GI AND BOWEL: Stomach demonstrates no acute abnormality. There is no bowel obstruction. There is a larger left femoral hernia containing a couple of segments of small bowel. REPRODUCTIVE ORGANS: Hysterectomy. No acute abnormality. PERITONEUM AND RETROPERITONEUM: No ascites. No free air. VASCULATURE: Aorta is normal in caliber. Aortoiliac atherosclerosis. ABDOMINAL AND PELVIS LYMPH NODES: No lymphadenopathy. BONES AND SOFT TISSUES: Nondisplaced buckle type fractures of the anterior right 4 to 6 ribs. Mildly displaced fracture of the left lateral 11th rib. A couple of remote left sided rib fractures noted. Exaggerated thoracic kyphosis. Unchanged mild chronic compression fracture of the mid to upper thoracic spine. Multilevel thoracic laminectomy changes. Multilevel degenerative disc disease. Osteopenia. Small fat containing right femoral hernia. No focal soft tissue abnormality. IMPRESSION: 1. Nondisplaced buckle type fractures of the anterior right 4 to 6 ribs and mildly displaced fracture of the left lateral 11th rib. 2. Small bilateral pleural effusions with bibasilar compressive atelectasis. No pneumothorax. 3. Left femoral hernia containing a couple of segments of small bowel. No findings of vascular compromise or upstream bowel obstruction. 4. Moderate cardiomegaly and moderate volume pericardial effusion. Electronically signed by: Rogelia Myers  MD MD 10/20/2024 12:27 PM EST RP Workstation: [id number]   CT HEAD WO CONTRAST ( ) Result Date: 10/20/2024 EXAM: CT HEAD WITHOUT CONTRAST 10/20/2024 11:14:49 AM TECHNIQUE: CT of the head was performed without the administration of intravenous contrast. Automated exposure control, iterative reconstruction, and/or weight based adjustment of the mA/kV was utilized to reduce the radiation dose to as low as reasonably achievable. COMPARISON: Brain MRI 06/22/2018, Head CT 10/16/2024. CLINICAL HISTORY: 84 year old female. Recurrent falls, found down. FINDINGS: BRAIN AND VENTRICLES: No acute hemorrhage. No evidence of acute infarct. No hydrocephalus. No extra-axial collection. No mass effect or midline shift. Mega Cisterna Magna, normal variant. Stable cerebral volume. Small but circumscribed chronic appearing lacunar infarct in the right thalamus is new from the previous MRI but stable recently (coronal image 35). Elsewhere gray white differentiation is within normal limits for age. Faint Basal Ganglia vascular calcifications. No suspicious intracranial vascular hyperdensity. ORBITS: No acute abnormality. SINUSES: Minor paranasal sinus mucosal thickening, significant stoutful. Tympanic cavities and mastoids remain well aerated. SOFT TISSUES AND SKULL: Mild right vertex scalp soft tissue swelling on series 4 image 71. Calcified atherosclerosis at the skull base. No skull fracture. IMPRESSION: 1. Right vertex mild scalp soft tissue injury. 2. No acute intracranial abnormality identified. Small chronic right thalamic lacunar infarct. Electronically signed by: Helayne Hurst MD MD 10/20/2024 11:42 AM EST RP Workstation: [id number]   DG Chest Portable 1 View Result Date: 10/20/2024 EXAM: 1 VIEW(S) XRAY OF THE CHEST 10/20/2024 10:34:00 AM COMPARISON: 09/29/2024 CLINICAL HISTORY: trauma FINDINGS: LINES, TUBES AND DEVICES: Multiple wires and leads project over the chest on the frontal radiograph. LUNGS AND PLEURA: Overlying  pleural thickening or trace fluid associated with suspected right rib fractures. Moderate left hemidiaphragm elevation. Left greater than right base subsegmental atelectasis and or scar. No pneumothorax. HEART AND MEDIASTINUM: Moderate cardiomegaly. No congestive heart failure. BONES AND SOFT TISSUES: Left shoulder arthroplasty. Suspect lateral mid right rib fractures. IMPRESSION: 1. Suspicion of right lateral rib fractures with overlying pleural thickening or fluid. CT is pending. 2. Cardiomegaly without congestive heart failure. Electronically signed by: Rockey Kilts MD MD 10/20/2024 11:20 AM EST RP Workstation:  [id number]   DG Hip Unilat W or Wo Pelvis 2-3 Views Left Result Date: 10/20/2024 EXAM: 2 or more VIEW(S) XRAY OF THE PELVIS AND LEFT HIP 10/20/2024 10:34:00 AM COMPARISON: None available. CLINICAL HISTORY: trauma FINDINGS: BONES AND JOINTS: SI joints are symmetric. No acute fracture. Bilateral hips demonstrate normal alignment. LUMBAR SPINE: Degenerative changes of the lower lumbar spine. SOFT TISSUES: Calcifications overlying the pelvis consistent with phleboliths. IMPRESSION: 1. No evidence of acute traumatic injury. Electronically signed by: Rockey Kilts MD MD 10/20/2024 11:17 AM EST RP Workstation: [id number]   CT HEAD WO CONTRAST ( ) Result Date: 10/16/2024 EXAM: CT HEAD WITHOUT 10/16/2024 03:22:25 PM TECHNIQUE: CT of the head was performed without the administration of intravenous contrast. Automated exposure control, iterative reconstruction, and/or weight based adjustment of the mA/kV was utilized to reduce the radiation dose to as low as reasonably achievable. COMPARISON: CT head 09/29/2024. CLINICAL HISTORY: FINDINGS: BRAIN AND VENTRICLES: No acute intracranial hemorrhage. No mass effect or midline shift. No extra-axial fluid collection. No evidence of acute infarct. No hydrocephalus. Similar mild for age chronic microvascular ischemic change. Similar chronic prominent retrocerebellar CSF.  ORBITS: No acute abnormality. SINUSES AND MASTOIDS: No acute abnormality. SOFT TISSUES AND SKULL: No acute skull fracture. No acute soft tissue abnormality. IMPRESSION: 1. No acute intracranial abnormality. Electronically signed by: Gilmore Molt 10/16/2024 03:46 PM EST RP Workstation: [id number]   DG Chest Portable 1 View Result Date: 09/29/2024 EXAM: 1 VIEW(S) XRAY OF THE CHEST 09/29/2024 12:42:13 PM COMPARISON: 07/28/2024 CLINICAL HISTORY: hypoxia FINDINGS: LUNGS AND PLEURA: Probable atelectatic changes at left lung base. Elevated left hemidiaphragm. No pleural effusion. No pneumothorax. HEART AND MEDIASTINUM: No acute abnormality of the cardiac and mediastinal silhouettes. BONES AND SOFT TISSUES: Left reverse shoulder arthroplasty noted. IMPRESSION: 1. Probable atelectatic changes at the left lung base and elevated left hemidiaphragm. Electronically signed by: Waddell Calk MD 09/29/2024 02:54 PM EST RP Workstation: [id number]   CT HEAD WO CONTRAST ( ) Result Date: 09/29/2024 EXAM: CT HEAD WITHOUT CONTRAST 09/29/2024 01:31:34 PM TECHNIQUE: CT of the head was performed without the administration of intravenous contrast. Automated exposure control, iterative reconstruction, and/or weight based adjustment of the mA/kV was utilized to reduce the radiation dose to as low as reasonably achievable. COMPARISON: CT head 07/28/2024 and MRI brain 06/22/2018. CLINICAL HISTORY: AMS. FINDINGS: BRAIN AND VENTRICLES: No acute hemorrhage. No evidence of acute territorial infarct. Chronic right thalamic lacunar infarction. Chronic lacunar infarction in the right extreme capsule. Overall unchanged mild-to-moderate scattered white matter hypodensities which are nonspecific but most commonly represent chronic microvascular ischemic changes. 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 Again partially empty sella, nonspecific. No hydrocephalus. No suspicious extra-axial collection. No mass effect or  midline shift. ORBITS: Bilateral lens replacement. SINUSES: Again trace right mastoid effusion. No acute abnormality. SOFT TISSUES AND SKULL: No acute soft tissue abnormality. No skull fracture. IMPRESSION: 1. No acute intracranial hemorrhage or mass effect. 2. No substantial change since 07/28/2024. Electronically signed by: Prentice Spade MD 09/29/2024 01:59 PM EST RP Workstation: [id number]    EKG: Sinus bradycardia nonspecific ST-T wave changes rate of 55 low voltage  ASSESSMENT AND PLAN:  Frequent falls Dementia Bradycardia Hyperlipidemia COPD Coronary artery disease Pericardial effusion on CT Paroxysmal atrial fibrillation Chronic renal sufficiency . Plan Multiple injuries from frequent falls will require probably skilled nursing facility for total care as well as memory care No significant pericardial effusion on echocardiogram no direct therapy necessary History of paroxysmal atrial fibrillation on Eliquis  but with frequent falls would recommend holding  anticoagulation continue metoprolol  but may reduce dose because of bradycardia Renal insufficiency maintain adequate hydration as necessary for renal perfusion Chronic anemia hemoglobin around 9 no evidence of bleeding continue current therapy  Advanced dementia currently on Seroquel  further management as per psychiatry on neurology Hyperlipidemia on Crestor  therapy will consider discontinuing Crestor  at this stage patient unlikely to gain any benefit No current evidence of clinical heart failure continue conservative management  Signed: Cara JONETTA Lovelace MD 10/20/2024, 8:52 PM      "

## 2024-10-20 NOTE — ED Notes (Signed)
 Fall risk bundle is currently in place.

## 2024-10-20 NOTE — ED Triage Notes (Signed)
 Pt to ED via [facility] from home for a fall. Pt fell last night at 10pm and has been in the floor since then. Pt c/o left hip pain and pain on the right side of her head. Per family she is at her baseline. Pt able to stand with assistance on scene.

## 2024-10-20 NOTE — ED Notes (Signed)
 Pt's temp 91.1 rectal. MD made aware. Pt placed on Bare Hugger.

## 2024-10-20 NOTE — H&P (Signed)
 " History and Physical    Laura Bishop FMW:[id number] DOB: 09-26-1941 DOA: 10/20/2024  PCP: Sherial Bail, MD (Confirm with patient/family/NH records and if not entered, [facility]) Patient coming from: Home  I have personally briefly reviewed patient's old medical records in [facility]  Chief Complaint: Frequent falls, back pain  HPI: Laura Bishop is a 84 y.o. female with medical history significant of advanced dementia, HTN, CKD stage IIIb, hypothyroidism, chronic ambulation impairment, PAF on Eliquis , stroke, brought in by family member for evaluation of frequent falls, new onset of back pain.  Patient is demented unable to provide any history, all history provided by husband at bedside.  Husband reported that last 3 days patient has had frequent falls, increasingly getting worse, she fell.  On the weekend and started to have 2-3 falls every day.  Last night patient fell on the left side and started complaining about severe back pain and right frontal headache.  Denied any neck pain.  At baseline patient uses roller walker to ambulate however last 3 days husband found the patient was very sluggish.  She also has advanced dementia, cannot recognize her family member only occasionally, she does feed herself but cannot dress herself or bathe herself, does not drive. ED Course: Temperature 91.1 pertinent bradycardia blood pressure 106/66 O2 saturation 96% on room air.  Chest x-ray cardiomegaly, trauma scan CT chest showed multiple rib fracture right side 6-8, left side 11, CT cervical spine showed mild prevertebral edema from C2-C4 suspicious for anterior ligamental injury, increasing T3 and T2 superior endplate compression fracture.  Blood work showed hemoglobin 9.1 compared to baseline 8-9, WBC 4.6 BUN 32 creatinine 1.8 bicarbonate 19K 4.6.  UA negative for UTI.  Review of Systems: Unable to perform, patient is baseline demented  Past Medical  History:  Diagnosis Date   Chronic kidney disease    chronic kidney disease stage III   Chronic lower back pain    Hyperlipidemia    Hypertension    Hypothyroidism    IDA (iron deficiency anemia)    Meningioma (HCC)    Osteoarthritis of both knees    osteoarthritis of knee   Osteoporosis    Overactive bladder    Vitamin D  deficiency     Past Surgical History:  Procedure Laterality Date   ABDOMINAL HYSTERECTOMY     APPENDECTOMY     BACK SURGERY     COLONOSCOPY WITH PROPOFOL  N/A 07/20/2016   Procedure: COLONOSCOPY WITH PROPOFOL ;  Surgeon: Lamar ONEIDA Holmes, MD;  Location: [facility] ENDOSCOPY;  Service: Endoscopy;  Laterality: N/A;   COLONOSCOPY WITH PROPOFOL  N/A 02/27/2019   Procedure: COLONOSCOPY WITH PROPOFOL ;  Surgeon: Holmes Lamar ONEIDA, MD;  Location: [facility] ENDOSCOPY;  Service: Endoscopy;  Laterality: N/A;   ESOPHAGOGASTRODUODENOSCOPY (EGD) WITH PROPOFOL  N/A 07/20/2016   Procedure: ESOPHAGOGASTRODUODENOSCOPY (EGD) WITH PROPOFOL ;  Surgeon: Lamar ONEIDA Holmes, MD;  Location: [facility] ENDOSCOPY;  Service: Endoscopy;  Laterality: N/A;   ESOPHAGOGASTRODUODENOSCOPY (EGD) WITH PROPOFOL  N/A 01/29/2020   Procedure: ESOPHAGOGASTRODUODENOSCOPY (EGD) WITH PROPOFOL ;  Surgeon: Toledo, Ladell POUR, MD;  Location: [facility] ENDOSCOPY;  Service: Gastroenterology;  Laterality: N/A;   EYE SURGERY     extraction cataract   lobectomy total thyroid        reports that she has never smoked. She has never used smokeless tobacco. She reports that she does not drink alcohol and does not use drugs.  Allergies[1]  Family History  Problem Relation Age of Onset   Stomach  cancer Mother    Lung cancer Father      Prior to Admission medications  Medication Sig Start Date End Date Taking? Authorizing Provider  acetaminophen  (TYLENOL ) 500 MG tablet Take by mouth.    [provider]  diphenhydramine -acetaminophen  (TYLENOL  PM) 25-500 MG TABS tablet Take 1 tablet by mouth at bedtime as needed.    [provider]  ELIQUIS  2.5 MG TABS tablet Take 2.5 mg by mouth 2 (two) times daily. 08/11/24   [provider]  levothyroxine  (SYNTHROID , LEVOTHROID) 75 MCG tablet Take 75 mcg by mouth daily before breakfast.    [provider]  metoprolol  succinate (TOPROL -XL) 25 MG 24 hr tablet Take 0.5 tablets (12.5 mg total) by mouth daily. 10/09/24   Laurita Pillion, MD  mirtazapine  (REMERON ) 7.5 MG tablet Take 1 tablet (7.5 mg total) by mouth at bedtime. 10/08/24   Laurita Pillion, MD  Multiple Vitamin (MULTIVITAMIN) tablet Take 1 tablet by mouth daily.    [provider]  QUEtiapine  (SEROQUEL ) 25 MG tablet Take 1 tablet (25 mg total) by mouth at bedtime. 10/08/24   Laurita Pillion, MD  rosuvastatin  (CRESTOR ) 5 MG tablet Take 5 mg by mouth daily. 08/01/22   [provider]  solifenacin (VESICARE) 10 MG tablet Take 10 mg by mouth daily. 07/06/24   [provider]  VITAMIN D  PO Take 125 mcg by mouth daily.    [provider]    Physical Exam: Vitals:   10/20/24 1145 10/20/24 1200 10/20/24 1202 10/20/24 1230  BP: 107/63 122/86  106/66  Pulse: (!) 54 (!) 59  62  Resp: 14 16  18   Temp:   (!) 91.1 F (32.8 C)   TempSrc:   Rectal   SpO2: 96% 98%  93%  Weight:      Height:        Constitutional: NAD, calm, comfortable Vitals:   10/20/24 1145 10/20/24 1200 10/20/24 1202 10/20/24 1230  BP: 107/63 122/86  106/66  Pulse: (!) 54 (!) 59  62  Resp: 14 16  18   Temp:   (!) 91.1 F (32.8 C)   TempSrc:   Rectal   SpO2: 96% 98%  93%  Weight:      Height:       Eyes: PERRL, lids and conjunctivae normal ENMT: Mucous membranes are moist. Posterior pharynx clear of any exudate or lesions.Normal dentition.  Neck: normal, supple, no masses, no thyromegaly Respiratory: clear to auscultation bilaterally, no wheezing, no crackles. Normal respiratory effort. No accessory muscle use.  Cardiovascular: Regular rate and rhythm, no murmurs / rubs / gallops. No extremity edema. 2+ pedal  pulses. No carotid bruits.  Abdomen: no tenderness, no masses palpated. No hepatosplenomegaly. Bowel sounds positive.  Musculoskeletal: Unable to perform, patient does not following commands Skin: no rashes, lesions, ulcers. No induration Neurologic: No facial droops, moving all limbs, not following, Psychiatric: Awake, confused    Labs on Admission: I have personally reviewed following labs and imaging studies  CBC: Recent Labs  Lab 10/20/24 1012  WBC 4.6  NEUTROABS 3.2  HGB 9.1*  HCT 29.0*  MCV 97.3  PLT 272   Basic Metabolic Panel: Recent Labs  Lab 10/20/24 1012  NA 145  K 4.6  CL 113*  CO2 19*  GLUCOSE 83  BUN 32*  CREATININE 1.84*  CALCIUM  9.5   GFR: Estimated Creatinine Clearance: 19.5 mL/min (A) (by C-G formula based on SCr of 1.84 mg/dL (H)). Liver Function Tests: Recent Labs  Lab 10/20/24 1012  AST 27  ALT 25  ALKPHOS 100  BILITOT 0.2  PROT 5.6*  ALBUMIN 2.9*   No results for input(s): LIPASE, AMYLASE in the last 168 hours. No results for input(s): AMMONIA in the last 168 hours. Coagulation Profile: Recent Labs  Lab 10/20/24 1012  INR 1.7*   Cardiac Enzymes: Recent Labs  Lab 10/20/24 1012  CKTOTAL 132   BNP (last 3 results) Recent Labs    09/29/24 1401  PROBNP 579.0*   HbA1C: No results for input(s): HGBA1C in the last 72 hours. CBG: No results for input(s): GLUCAP in the last 168 hours. Lipid Profile: No results for input(s): CHOL, HDL, LDLCALC, TRIG, CHOLHDL, LDLDIRECT in the last 72 hours. Thyroid  Function Tests: Recent Labs    10/20/24 1030  TSH 4.460  FREET4 0.78*   Anemia Panel: No results for input(s): VITAMINB12, FOLATE, FERRITIN, TIBC, IRON, RETICCTPCT in the last 72 hours. Urine analysis:    Component Value Date/Time   COLORURINE YELLOW (A) 10/20/2024 1158   APPEARANCEUR CLEAR (A) 10/20/2024 1158   LABSPEC 1.018 10/20/2024 1158   PHURINE 5.0 10/20/2024 1158   GLUCOSEU  NEGATIVE 10/20/2024 1158   HGBUR NEGATIVE 10/20/2024 1158   BILIRUBINUR NEGATIVE 10/20/2024 1158   KETONESUR NEGATIVE 10/20/2024 1158   PROTEINUR NEGATIVE 10/20/2024 1158   NITRITE NEGATIVE 10/20/2024 1158   LEUKOCYTESUR NEGATIVE 10/20/2024 1158    Radiological Exams on Admission: CT Cervical Spine Wo Contrast Addendum Date: 10/20/2024 ADDENDUM: Study discussed by telephone with Doctor Clarine at 13:12 on October 20, 2024. ---------------------------------------------------- Electronically signed by: Helayne Hurst MD MD 10/20/2024 01:23 PM EST RP Workstation: [id number]   Result Date: 10/20/2024 ORIGINAL REPORTEXAM: CT CERVICAL SPINE WITHOUT CONTRAST 10/20/2024 11:14:49 AM TECHNIQUE: CT of the cervical spine was performed without the administration of intravenous contrast. Multiplanar reformatted images are provided for review. Automated exposure control, iterative reconstruction, and/or weight based adjustment of the mA/kV was utilized to reduce the radiation dose to as low as reasonably achievable. COMPARISON: Head CT 10/20/2024, reported separately. Cervical spine CT 07/28/2024. CT chest 10/20/2024, reported separately. CLINICAL HISTORY: 84 year old female with recurrent falls, found down. FINDINGS: BONES AND ALIGNMENT: Stable chronic straightening of cervical lordosis. No acute traumatic malalignment. No acute vertebral fracture is identified in the cervical spine. T3 superior endplate compression fracture is new since October, age indeterminate. This represents 22% loss of vertebral body height. No retropulsion. Posterior elements of T3 appear intact and aligned. More subtle compression of the T2 superior endplate also appears new since October. Posterior elements of T2 appear intact and aligned. DEGENERATIVE CHANGES: Mild for age cervical spine degeneration appears stable. Ordinary cervical carotid calcified atherosclerosis. SOFT TISSUES: Positive for mild new prevertebral edema (series 4 image 42) most  pronounced from the C2-C3 through the right C3-C4 levels. Otherwise negative visible non-contrast neck soft tissues. No CT evidence of cervical spinal canal hemorrhage. IMPRESSION: 1. Positive for mild prevertebral edema from C2 to C4, compatible with acute anterior ligamentous injury in this setting. No superimposed acute cervical spine fracture. 2. New since October and age indeterminant T3 (22% loss of vertebral height) and subtle T2 superior endplate compression fractures. No retropulsion or complicating features. Electronically signed by: Helayne Hurst MD MD 10/20/2024 01:06 PM EST RP Workstation: [id number]   CT CHEST ABDOMEN PELVIS WO CONTRAST Result Date: 10/20/2024 EXAM: CT CHEST, ABDOMEN AND PELVIS WITHOUT CONTRAST 10/20/2024 11:14:49 AM TECHNIQUE: CT of the chest, abdomen and pelvis was performed without the administration of intravenous contrast. Multiplanar reformatted images are provided for review. Automated  exposure control, iterative reconstruction, and/or weight based adjustment of the mA/kV was utilized to reduce the radiation dose to as low as reasonably achievable. COMPARISON: 01/19/2020 CLINICAL HISTORY: Recurrent falls, weakness, abdominal pain --evaluate for traumatic injury, underlying infection or source of pain. FINDINGS: CHEST: MEDIASTINUM AND LYMPH NODES: Moderate cardiomegaly. Dense multivessel coronary atherosclerosis. Moderate volume pericardial effusion. The central airways are clear. No mediastinal, hilar or axillary lymphadenopathy. LUNGS AND PLEURA: Small bilateral pleural effusions with bibasilar compressive atelectasis. No focal consolidation or pulmonary edema. No pneumothorax. ABDOMEN AND PELVIS: LIVER: Unremarkable. GALLBLADDER AND BILE DUCTS: Unremarkable. No biliary ductal dilatation. SPLEEN: No acute abnormality. PANCREAS: Pancreatic parenchymal atrophy. ADRENAL GLANDS: No acute abnormality. KIDNEYS, URETERS AND BLADDER: Unchanged appearance of bilateral renal cysts, a  couple of which are proteinaceous or hemorrhagic. Diffuse renal cortical atrophy on the left, slightly worse in the interim. No stones in the kidneys or ureters. No hydronephrosis. No perinephric or periureteral stranding. Urinary bladder is unremarkable. GI AND BOWEL: Stomach demonstrates no acute abnormality. There is no bowel obstruction. There is a larger left femoral hernia containing a couple of segments of small bowel. REPRODUCTIVE ORGANS: Hysterectomy. No acute abnormality. PERITONEUM AND RETROPERITONEUM: No ascites. No free air. VASCULATURE: Aorta is normal in caliber. Aortoiliac atherosclerosis. ABDOMINAL AND PELVIS LYMPH NODES: No lymphadenopathy. BONES AND SOFT TISSUES: Nondisplaced buckle type fractures of the anterior right 4 to 6 ribs. Mildly displaced fracture of the left lateral 11th rib. A couple of remote left sided rib fractures noted. Exaggerated thoracic kyphosis. Unchanged mild chronic compression fracture of the mid to upper thoracic spine. Multilevel thoracic laminectomy changes. Multilevel degenerative disc disease. Osteopenia. Small fat containing right femoral hernia. No focal soft tissue abnormality. IMPRESSION: 1. Nondisplaced buckle type fractures of the anterior right 4 to 6 ribs and mildly displaced fracture of the left lateral 11th rib. 2. Small bilateral pleural effusions with bibasilar compressive atelectasis. No pneumothorax. 3. Left femoral hernia containing a couple of segments of small bowel. No findings of vascular compromise or upstream bowel obstruction. 4. Moderate cardiomegaly and moderate volume pericardial effusion. Electronically signed by: Rogelia Myers MD MD 10/20/2024 12:27 PM EST RP Workstation: [id number]   CT HEAD WO CONTRAST ( ) Result Date: 10/20/2024 EXAM: CT HEAD WITHOUT CONTRAST 10/20/2024 11:14:49 AM TECHNIQUE: CT of the head was performed without the administration of intravenous contrast. Automated exposure control, iterative reconstruction, and/or  weight based adjustment of the mA/kV was utilized to reduce the radiation dose to as low as reasonably achievable. COMPARISON: Brain MRI 06/22/2018, Head CT 10/16/2024. CLINICAL HISTORY: 84 year old female. Recurrent falls, found down. FINDINGS: BRAIN AND VENTRICLES: No acute hemorrhage. No evidence of acute infarct. No hydrocephalus. No extra-axial collection. No mass effect or midline shift. Mega Cisterna Magna, normal variant. Stable cerebral volume. Small but circumscribed chronic appearing lacunar infarct in the right thalamus is new from the previous MRI but stable recently (coronal image 35). Elsewhere gray white differentiation is within normal limits for age. Faint Basal Ganglia vascular calcifications. No suspicious intracranial vascular hyperdensity. ORBITS: No acute abnormality. SINUSES: Minor paranasal sinus mucosal thickening, significant stoutful. Tympanic cavities and mastoids remain well aerated. SOFT TISSUES AND SKULL: Mild right vertex scalp soft tissue swelling on series 4 image 71. Calcified atherosclerosis at the skull base. No skull fracture. IMPRESSION: 1. Right vertex mild scalp soft tissue injury. 2. No acute intracranial abnormality identified. Small chronic right thalamic lacunar infarct. Electronically signed by: Helayne Hurst MD MD 10/20/2024 11:42 AM EST RP Workstation: [id number]   DG Chest Portable 1  View Result Date: 10/20/2024 EXAM: 1 VIEW(S) XRAY OF THE CHEST 10/20/2024 10:34:00 AM COMPARISON: 09/29/2024 CLINICAL HISTORY: trauma FINDINGS: LINES, TUBES AND DEVICES: Multiple wires and leads project over the chest on the frontal radiograph. LUNGS AND PLEURA: Overlying pleural thickening or trace fluid associated with suspected right rib fractures. Moderate left hemidiaphragm elevation. Left greater than right base subsegmental atelectasis and or scar. No pneumothorax. HEART AND MEDIASTINUM: Moderate cardiomegaly. No congestive heart failure. BONES AND SOFT TISSUES: Left shoulder  arthroplasty. Suspect lateral mid right rib fractures. IMPRESSION: 1. Suspicion of right lateral rib fractures with overlying pleural thickening or fluid. CT is pending. 2. Cardiomegaly without congestive heart failure. Electronically signed by: Rockey Kilts MD MD 10/20/2024 11:20 AM EST RP Workstation: [id number]   DG Hip Unilat W or Wo Pelvis 2-3 Views Left Result Date: 10/20/2024 EXAM: 2 or more VIEW(S) XRAY OF THE PELVIS AND LEFT HIP 10/20/2024 10:34:00 AM COMPARISON: None available. CLINICAL HISTORY: trauma FINDINGS: BONES AND JOINTS: SI joints are symmetric. No acute fracture. Bilateral hips demonstrate normal alignment. LUMBAR SPINE: Degenerative changes of the lower lumbar spine. SOFT TISSUES: Calcifications overlying the pelvis consistent with phleboliths. IMPRESSION: 1. No evidence of acute traumatic injury. Electronically signed by: Rockey Kilts MD MD 10/20/2024 11:17 AM EST RP Workstation: [id number]    EKG: Independently reviewed.  Sinus rhythm, low voltage, no acute ST changes.  Assessment/Plan Active Problems:   * No active hospital problems. *  (please populate well all problems here in Problem List. (For example, if patient is on BP meds at home and you resume or decide to hold them, it is a problem that needs to be her. Same for CAD, COPD, HLD and so on)  Acute on chronic ambulatory impairment Frequent falls Right 4-6 ribs and 11 ribs fracture - PT OT evaluation - IV Dilaudid  for pain control - Lidocaine  patch - Given that patient also has a acute onset of back pain associate with frequent falls, suspect lumbar spine injury, will order a lumbar spine MRI.  Question of prevertebral ligament injury - CT cervical spine showed prevertebral edema implying anterior ligamentous injury.  Neurosurgery Dr. Katrina was consulted who recommended MRI cervical spine - Neurosurgery recommended defer hard collar, as no hard evidence of fracture. - Confirmed with husband patient is DNR/DNI  and does not want any aggressive measures such as surgery.  Hyperthermia - Unknown etiology, no other systemic inflammation signs such as tachycardia low blood pressure or leukocytosis.  Monitor off antibiotics - Bear hugger - Admit to PCU observation for close monitoring  Pericardial effusion - New compared to a more recent echo done in October 2025.  Clinically patient does not have hypotension tachycardia, no JVD, low suspicion for cardiac tamponade. - Discussed with on-call cardiology Dr. Florencio, who ok with IV diuresis - Echocardiogram  Question of new onset of CHF - Clinically patient has symptoms and signs of fluid overload, bilateral pleural effusion and bilateral ankle edema.  Will check echocardiogram as per recommended by cardiology. - Start low-dose of Lasix  20 mg daily, cardiology agreed.  PAF - Hold off Eliquis  - Start chemical DVT prophylaxis  CKD stage IIIb - Creatinine level stable - Fluid overload, IV diuresis as above.  Advanced dementia - According to husband at bedside, patient's mentation is at her baseline - Avoid sedation medications  Prolonged Qtc - Recheck EKG tomorrow  Hypothyroidism - TSH within normal limits - Continue current dose of Synthroid   DVT prophylaxis: Heparin  subcu Code Status: DNR/DNI Family Communication: Husband at  bedside Disposition Plan: Expect less than 2 midnight hospital stay Consults called: Cardiology Admission status: PCU observation   Cort ONEIDA Mana MD [facility] Pager [phone]  10/20/2024, 1:41 PM       [1]  Allergies Allergen Reactions   Other Other (See Comments)    Contraindicated by renal insufficiency Contraindicated by renal insufficiency    Estrogens    Nsaids    "

## 2024-10-20 NOTE — ED Notes (Signed)
 ED RN taking over care of pt at this time after receiving handoff. Pt reporting to ED d/t fall and found to have several fx ribs. Pt was hypothermic and has been under the bear warmer for several hours. Pt has no family at the bedside at this time. Pt ABCs intact. RR even and unlabored. Pt in NAD. Bed in lowest locked position. Call bell in reach.   Past Medical History:  Diagnosis Date   Chronic kidney disease    chronic kidney disease stage III   Chronic lower back pain    Hyperlipidemia    Hypertension    Hypothyroidism    IDA (iron deficiency anemia)    Meningioma (HCC)    Osteoarthritis of both knees    osteoarthritis of knee   Osteoporosis    Overactive bladder    Vitamin D  deficiency

## 2024-10-21 DIAGNOSIS — M4802 Spinal stenosis, cervical region: Secondary | ICD-10-CM | POA: Diagnosis present

## 2024-10-21 DIAGNOSIS — E860 Dehydration: Secondary | ICD-10-CM | POA: Diagnosis present

## 2024-10-21 DIAGNOSIS — W19XXXD Unspecified fall, subsequent encounter: Secondary | ICD-10-CM | POA: Diagnosis not present

## 2024-10-21 DIAGNOSIS — I480 Paroxysmal atrial fibrillation: Secondary | ICD-10-CM | POA: Diagnosis present

## 2024-10-21 DIAGNOSIS — F05 Delirium due to known physiological condition: Secondary | ICD-10-CM | POA: Diagnosis present

## 2024-10-21 DIAGNOSIS — I5033 Acute on chronic diastolic (congestive) heart failure: Secondary | ICD-10-CM | POA: Diagnosis present

## 2024-10-21 DIAGNOSIS — E870 Hyperosmolality and hypernatremia: Secondary | ICD-10-CM | POA: Diagnosis present

## 2024-10-21 DIAGNOSIS — S2243XA Multiple fractures of ribs, bilateral, initial encounter for closed fracture: Secondary | ICD-10-CM | POA: Diagnosis present

## 2024-10-21 DIAGNOSIS — E872 Acidosis, unspecified: Secondary | ICD-10-CM | POA: Diagnosis present

## 2024-10-21 DIAGNOSIS — R627 Adult failure to thrive: Secondary | ICD-10-CM | POA: Insufficient documentation

## 2024-10-21 DIAGNOSIS — Z66 Do not resuscitate: Secondary | ICD-10-CM | POA: Diagnosis present

## 2024-10-21 DIAGNOSIS — F03918 Unspecified dementia, unspecified severity, with other behavioral disturbance: Secondary | ICD-10-CM | POA: Diagnosis present

## 2024-10-21 DIAGNOSIS — Z515 Encounter for palliative care: Secondary | ICD-10-CM | POA: Diagnosis not present

## 2024-10-21 DIAGNOSIS — I2510 Atherosclerotic heart disease of native coronary artery without angina pectoris: Secondary | ICD-10-CM | POA: Diagnosis present

## 2024-10-21 DIAGNOSIS — W19XXXA Unspecified fall, initial encounter: Secondary | ICD-10-CM | POA: Diagnosis present

## 2024-10-21 DIAGNOSIS — Z79890 Hormone replacement therapy (postmenopausal): Secondary | ICD-10-CM | POA: Diagnosis not present

## 2024-10-21 DIAGNOSIS — S2241XA Multiple fractures of ribs, right side, initial encounter for closed fracture: Secondary | ICD-10-CM | POA: Diagnosis present

## 2024-10-21 DIAGNOSIS — E039 Hypothyroidism, unspecified: Secondary | ICD-10-CM | POA: Diagnosis present

## 2024-10-21 DIAGNOSIS — R296 Repeated falls: Secondary | ICD-10-CM | POA: Diagnosis present

## 2024-10-21 DIAGNOSIS — Y92009 Unspecified place in unspecified non-institutional (private) residence as the place of occurrence of the external cause: Secondary | ICD-10-CM | POA: Diagnosis not present

## 2024-10-21 DIAGNOSIS — Z7901 Long term (current) use of anticoagulants: Secondary | ICD-10-CM | POA: Diagnosis not present

## 2024-10-21 DIAGNOSIS — E162 Hypoglycemia, unspecified: Secondary | ICD-10-CM | POA: Insufficient documentation

## 2024-10-21 DIAGNOSIS — S79912A Unspecified injury of left hip, initial encounter: Secondary | ICD-10-CM | POA: Diagnosis present

## 2024-10-21 DIAGNOSIS — I3139 Other pericardial effusion (noninflammatory): Secondary | ICD-10-CM | POA: Diagnosis present

## 2024-10-21 DIAGNOSIS — E785 Hyperlipidemia, unspecified: Secondary | ICD-10-CM | POA: Diagnosis present

## 2024-10-21 DIAGNOSIS — Z79899 Other long term (current) drug therapy: Secondary | ICD-10-CM | POA: Diagnosis not present

## 2024-10-21 DIAGNOSIS — N1832 Chronic kidney disease, stage 3b: Secondary | ICD-10-CM | POA: Diagnosis present

## 2024-10-21 DIAGNOSIS — I130 Hypertensive heart and chronic kidney disease with heart failure and stage 1 through stage 4 chronic kidney disease, or unspecified chronic kidney disease: Secondary | ICD-10-CM | POA: Diagnosis present

## 2024-10-21 DIAGNOSIS — J449 Chronic obstructive pulmonary disease, unspecified: Secondary | ICD-10-CM | POA: Diagnosis present

## 2024-10-21 LAB — BASIC METABOLIC PANEL WITH GFR
Anion gap: 14 (ref 5–15)
BUN: 24 mg/dL — ABNORMAL HIGH (ref 8–23)
CO2: 14 mmol/L — ABNORMAL LOW (ref 22–32)
Calcium: 7.9 mg/dL — ABNORMAL LOW (ref 8.9–10.3)
Chloride: 119 mmol/L — ABNORMAL HIGH (ref 98–111)
Creatinine, Ser: 1.43 mg/dL — ABNORMAL HIGH (ref 0.44–1.00)
GFR, Estimated: 36 mL/min — ABNORMAL LOW
Glucose, Bld: 42 mg/dL — CL (ref 70–99)
Potassium: 3.7 mmol/L (ref 3.5–5.1)
Sodium: 147 mmol/L — ABNORMAL HIGH (ref 135–145)

## 2024-10-21 LAB — CBG MONITORING, ED: Glucose-Capillary: 59 mg/dL — ABNORMAL LOW (ref 70–99)

## 2024-10-21 LAB — PRO BRAIN NATRIURETIC PEPTIDE: Pro Brain Natriuretic Peptide: 1354 pg/mL — ABNORMAL HIGH

## 2024-10-21 MED ORDER — DEXTROSE 50 % IV SOLN
25.0000 g | Freq: Once | INTRAVENOUS | Status: AC
  Administered 2024-10-21: 25 g via INTRAVENOUS
  Filled 2024-10-21: qty 50

## 2024-10-21 MED ORDER — FUROSEMIDE 10 MG/ML IJ SOLN
40.0000 mg | Freq: Every day | INTRAMUSCULAR | Status: DC
  Administered 2024-10-21: 40 mg via INTRAVENOUS
  Filled 2024-10-21: qty 4

## 2024-10-21 MED ORDER — HALOPERIDOL LACTATE 5 MG/ML IJ SOLN: 0.5000 mg | INTRAMUSCULAR | Status: DC | PRN

## 2024-10-21 MED ORDER — DEXTROSE 50 % IV SOLN
INTRAVENOUS | Status: AC
  Filled 2024-10-21: qty 50

## 2024-10-21 MED ORDER — DEXTROSE 50 % IV SOLN
25.0000 mL | Freq: Once | INTRAVENOUS | Status: AC
  Administered 2024-10-21: 25 mL via INTRAVENOUS

## 2024-10-21 MED ORDER — GLYCOPYRROLATE 0.2 MG/ML IJ SOLN
0.2000 mg | INTRAMUSCULAR | Status: DC | PRN
  Administered 2024-10-22: 0.2 mg via INTRAVENOUS
  Filled 2024-10-21: qty 1

## 2024-10-21 MED ORDER — HALOPERIDOL LACTATE 2 MG/ML PO CONC
0.5000 mg | ORAL | Status: DC | PRN
  Administered 2024-10-23: 0.5 mg via SUBLINGUAL
  Filled 2024-10-21: qty 5
  Filled 2024-10-21: qty 0.25

## 2024-10-21 MED ORDER — HALOPERIDOL 0.5 MG PO TABS: 0.5000 mg | ORAL_TABLET | ORAL | Status: DC | PRN

## 2024-10-21 MED ORDER — MORPHINE SULFATE (CONCENTRATE) 10 MG /0.5 ML PO SOLN
5.0000 mg | ORAL | Status: DC | PRN
  Administered 2024-10-24 – 2024-10-31 (×2): 5 mg via ORAL
  Filled 2024-10-21 (×5): qty 0.5

## 2024-10-21 MED ORDER — DIPHENHYDRAMINE HCL 50 MG/ML IJ SOLN: 12.5000 mg | INTRAMUSCULAR | Status: DC | PRN

## 2024-10-21 MED ORDER — SODIUM BICARBONATE 8.4 % IV SOLN
INTRAVENOUS | Status: DC
  Filled 2024-10-21: qty 1000

## 2024-10-21 MED ORDER — GLYCOPYRROLATE 1 MG PO TABS: 1.0000 mg | ORAL_TABLET | ORAL | Status: DC | PRN

## 2024-10-21 MED ORDER — MORPHINE SULFATE (CONCENTRATE) 10 MG /0.5 ML PO SOLN
5.0000 mg | ORAL | Status: DC | PRN
  Administered 2024-10-24 – 2024-10-31 (×9): 5 mg via SUBLINGUAL
  Filled 2024-10-21 (×7): qty 0.5

## 2024-10-21 MED ORDER — GLYCOPYRROLATE 0.2 MG/ML IJ SOLN: 0.2000 mg | INTRAMUSCULAR | Status: DC | PRN

## 2024-10-21 NOTE — TOC Initial Note (Signed)
 Transition of Care [facility]) - Initial/Assessment Note    Patient Details  Name: Laura Bishop MRN: [id number] Date of Birth: 1940-10-21  [facility]) CM/SW Contact:    Dain Laseter L Viet Kemmerer, LCSW Phone Number: 10/21/2024, 10:55 AM  Clinical Narrative:                  Recommendations received from PT/OT for SNF placement. Patient declined since last admission. CSW met with patient, spouse was at bedside. Brief assessment completed with spouse. Patient was alert but appeared to be only by 2x.   Spouse advised that he is agreeable to SNF placement. He stated that he is not able to manage care at home. Patient active with [facility] and they come into the home to assist.   Choice reviewed. Spouse advised that he would like searched in [facility]. CSW advised that bed searches may extend to [facility] due to lack of bed availability. Spouse was agreeable to this.   FL2 completed and bed search initiated.        Patient Goals and CMS Choice            Expected Discharge Plan and Services                                              Prior Living Arrangements/Services                       Activities of Daily Living      Permission Sought/Granted                  Emotional Assessment              Admission diagnosis:  Fall [W19.XXXA] Patient Active Problem List   Diagnosis Date Noted   Hypernatremia 10/21/2024   Cervical spinal stenosis 10/21/2024   Hypoglycemia 10/21/2024   Fall at home, initial encounter 10/20/2024   Fall 10/20/2024   Hypophosphatemia 10/07/2024   Acute metabolic encephalopathy 10/05/2024   Pressure injury of skin 10/05/2024   Dementia with behavioral disturbance (HCC) 10/05/2024   CKD stage 3b, GFR 30-44 ml/min (HCC) 10/05/2024   Metabolic acidosis 10/05/2024   Hypokalemia 10/05/2024   UTI (urinary tract infection) 09/29/2024   PCP:  Sherial Bail, MD Pharmacy:   [phone] - [place], [place] - [address] [place] [address] Phone: [phone] Fax: [phone]  [facility] - [address]), [place] - [address] [place]) [place] [address] Phone: [phone] Fax: [phone]     Social Drivers of Health (SDOH) Social History: SDOH Screenings   Food Insecurity: No Food Insecurity (10/16/2024)   Received from [facility]  Housing: Low Risk  (10/16/2024)   Received from [facility]  Transportation Needs: No Transportation Needs (10/16/2024)   Received from [facility]  Utilities: Not At Risk (10/16/2024)   Received from [facility]  Financial Resource Strain: Low Risk  (10/16/2024)   Received from [facility]  Social Connections: Patient Unable To Answer (09/30/2024)  Tobacco Use: Low Risk (10/20/2024)  Recent Concern: Tobacco Use - Medium Risk (10/18/2024)   Received from [facility]   SDOH Interventions:     Readmission Risk Interventions  No data to display

## 2024-10-21 NOTE — Hospital Course (Addendum)
 Laura Bishop is a 84 y.o. female with medical history significant of advanced dementia, HTN, CKD stage IIIb, hypothyroidism, chronic ambulation impairment, PAF on Eliquis , stroke, brought in by family member for evaluation of frequent falls, new onset of back pain.  Patient has advanced dementia, has been eating very little.  Patient currently followed by hospice as outpatient, per husband's request, to comfort care.  Patient will be transferred to nursing home with comfort care.

## 2024-10-21 NOTE — Progress Notes (Signed)
 [facility]    SUBJECTIVE: Unable to give much of a history because of dementia patient is alert audible sounds but no coherent dialogue.  Husband is at the bedside does not appear to be in any pain followed by hospice as an outpatient.   Vitals:   10/21/24 0700 10/21/24 0725 10/21/24 1030 10/21/24 1230  BP: 117/62  103/65 126/78  Pulse: 89  89 99  Resp: 18  20 18   Temp:      TempSrc:      SpO2: 98% 95% 95% 97%  Weight:      Height:         Intake/Output Summary (Last 24 hours) at 10/21/2024 1443 Last data filed at 10/21/2024 1206 Gross per 24 hour  Intake 13.32 ml  Output 575 ml  Net -561.68 ml      PHYSICAL EXAM  General: Well developed, well nourished, in no acute distress HEENT:  Normocephalic and atramatic Neck:  No JVD.  Lungs: Clear bilaterally to auscultation and percussion. Heart: HRRR . Normal S1 and S2 without gallops or murmurs.  Abdomen: Bowel sounds are positive, abdomen soft and non-tender  Msk:  Back normal, normal gait. Normal strength and tone for age. Extremities: No clubbing, cyanosis or edema.   Neuro: Alert and oriented X 3. Psych:  Good affect, responds appropriately   LABS: Basic Metabolic Panel: Recent Labs    10/20/24 1012 10/21/24 0555  NA 145 147*  K 4.6 3.7  CL 113* 119*  CO2 19* 14*  GLUCOSE 83 42*  BUN 32* 24*  CREATININE 1.84* 1.43*  CALCIUM  9.5 7.9*   Liver Function Tests: Recent Labs    10/20/24 1012  AST 27  ALT 25  ALKPHOS 100  BILITOT 0.2  PROT 5.6*  ALBUMIN 2.9*   No results for input(s): LIPASE, AMYLASE in the last 72 hours. CBC: Recent Labs    10/20/24 1012  WBC 4.6  NEUTROABS 3.2  HGB 9.1*  HCT 29.0*  MCV 97.3  PLT 272   Cardiac Enzymes: Recent Labs    10/20/24 1012  CKTOTAL 132   BNP: Invalid input(s): POCBNP D-Dimer: No results for input(s): DDIMER in the last 72 hours. Hemoglobin A1C: No results for input(s): HGBA1C in the last 72 hours. Fasting Lipid Panel: No results  for input(s): CHOL, HDL, LDLCALC, TRIG, CHOLHDL, LDLDIRECT in the last 72 hours. Thyroid  Function Tests: Recent Labs    10/20/24 1030  TSH 4.460   Anemia Panel: No results for input(s): VITAMINB12, FOLATE, FERRITIN, TIBC, IRON, RETICCTPCT in the last 72 hours.  MR CERVICAL SPINE WO CONTRAST Result Date: 10/20/2024 EXAM: MRI CERVICAL SPINE WITHOUT CONTRAST 10/20/2024 03:54:58 PM TECHNIQUE: Multiplanar multisequence MRI of the cervical spine was performed. COMPARISON: CT cervical spine 10/20/2024. CLINICAL HISTORY: Spine fracture, cervical, traumatic. Fall. FINDINGS: LIMITATIONS/ARTIFACTS: The examination is motion degraded, including moderate to severe motion on the sagittal STIR and both axial sequences. BONES AND ALIGNMENT: Exaggerated upper thoracic kyphosis and lower cervical lordosis. T3 superior endplate compression fracture with 25% vertebral body height loss and moderate marrow edema. No gross fracture or marrow edema in the cervical spine with assessment limited by motion artifact. SPINAL CORD: Assessment of the spinal cord is limited by motion, however no cord edema is evident on the sagittal T2 sequence which is only mildly degraded. No visible epidural hematoma. SOFT TISSUES: Mild prevertebral edema greatest from C1 to C3. No discrete anterior longitudinal ligament disruption on this motion integrated examination. Moderate marrow edema throughout the right greater than  left posterior paraspinal soft tissues. DISC LEVELS: Detailed assessment of degenerative changes is limited by motion. Disc bulging and infolding of the ligamentum flavum result in mild spinal stenosis at C3-C4, moderate spinal stenosis at C4-C5, and severe spinal stenosis at C5-C6 and C6-C7 with at least mild spinal cord mass effect at C5-C6 and C6-C7. IMPRESSION: 1. Motion degraded examination. 2. Mild prevertebral edema and moderate posterior paraspinal soft tissue edema. No discrete ligamentous  disruption or epidural tumor identified within study limitations. 3. Acute T3 compression fracture with 25% vertebral body height loss. 4. Suspected severe spinal stenosis at C5-C6 and C6-C7 and moderate spinal stenosis at C4-C5. Electronically signed by: Dasie Hamburg MD MD 10/20/2024 04:33 PM EST RP Workstation: [id number]   MR LUMBAR SPINE WO CONTRAST Result Date: 10/20/2024 EXAM: MRI LUMBAR SPINE 10/20/2024 03:54:58 PM TECHNIQUE: Multiplanar multisequence MRI of the lumbar spine was performed without the administration of intravenous contrast. COMPARISON: CT chest, abdomen, and pelvis 10/20/2024. CLINICAL HISTORY: Low back pain, increased fracture risk. Fall. Left hip pain. FINDINGS: BONES AND ALIGNMENT: 5 lumbar type of vertebrae. Mild lumbar dextroscoliosis. Trace anterolisthesis of L3, L4, and L5 on S1. Partially visualized marrow edema in the sacral ala bilaterally and extending transversely through the anterior S3 segment consistent with acute fractures. Normal vertebral body heights. SPINAL CORD: The conus medullaris terminates at L1-L2 and is normal in signal. 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 Mild presacral soft tissue swelling/fluid. The abdominal and pelvic viscera were more fully evaluated on today's CT. DISC LEVELS: Disc desiccation throughout the lumbar spine. Severe disc space narrowing at L5-S1. T12-L1: Small central disc protrusion without stenosis. L1-L2: Shallow central disc protrusion with annular fissure and mild facet and ligamentum flavum hypertrophy without stenosis. L2-L3: Mild disc bulging and mild facet hypertrophy and mild stenosis. L3-L4: Anterolisthesis with bulging of uncovered disc and moderate facet hypertrophy without stenosis. Right foraminal to extraforaminal annular fissure. L4-L5: Mild disc bulging and moderate facet hypertrophy without stenosis. Small bilateral foraminal annular fissures. L5-S1: Anterolisthesis with bulging of uncovered disc and severe facet  hypertrophy result in mild bilateral neural foraminal stenosis without spinal stenosis. IMPRESSION: 1. Acute bilateral sacral fractures. 2. Multilevel lumbar disc and facet degeneration without high-grade stenosis. Electronically signed by: Dasie Hamburg MD MD 10/20/2024 04:20 PM EST RP Workstation: [id number]   ECHOCARDIOGRAM COMPLETE Result Date: 10/20/2024    ECHOCARDIOGRAM REPORT   Patient Name:   Laura Bishop Date of Exam: 10/20/2024 Medical Rec #:  [id number]        Height:       60.0 in Accession #:    [id number]       Weight:       143.3 lb Date of Birth:  [birth year]        BSA:          1.620 m Patient Age:    83 years         BP:           122/76 mmHg Patient Gender: F                HR:           72 bpm. Exam Location:  [facility] Procedure: 2D Echo, Cardiac Doppler and Color Doppler (Both Spectral and Color            Flow Doppler were utilized during procedure).  MODIFIED REPORT:    This report was modified by Cara JONETTA Lovelace MD on 10/20/2024 due to small                              pericardial effusion.  Indications:     Pericardial Effusion  History:         Patient has no prior history of Echocardiogram examinations.                  CKD.  Sonographer:     Philomena Daring Referring Phys:  [id number] CORT ONEIDA MANA Diagnosing Phys: Cara JONETTA Lovelace MD  Sonographer Comments: Image acquisition challenging due to uncooperative patient. IMPRESSIONS  1. Left ventricular ejection fraction, by estimation, is 60 to 65%. The left ventricle has normal function. The left ventricle has no regional wall motion abnormalities. Left ventricular diastolic parameters are consistent with Grade I diastolic dysfunction (impaired relaxation).  2. Right ventricular systolic function is mildly reduced. The right ventricular size is mildly enlarged.  3. A small pericardial effusion is present. The pericardial effusion is anterior to the right ventricle, surrounding the apex and localized near the right  atrium.  4. The mitral valve is normal in structure. No evidence of mitral valve regurgitation.  5. The aortic valve is normal in structure. Aortic valve regurgitation is trivial.  6. Aortic dilatation noted. There is mild dilatation of the ascending aorta, measuring 40 mm. FINDINGS  Left Ventricle: Left ventricular ejection fraction, by estimation, is 60 to 65%. The left ventricle has normal function. The left ventricle has no regional wall motion abnormalities. Strain was performed and the global longitudinal strain is indeterminate. The left ventricular internal cavity size was normal in size. There is borderline left ventricular hypertrophy. Left ventricular diastolic parameters are consistent with Grade I diastolic dysfunction (impaired relaxation). Right Ventricle: The right ventricular size is mildly enlarged. No increase in right ventricular wall thickness. Right ventricular systolic function is mildly reduced. Left Atrium: Left atrial size was normal in size. Right Atrium: Right atrial size was normal in size. Pericardium: A small pericardial effusion is present. The pericardial effusion is anterior to the right ventricle, surrounding the apex and localized near the right atrium. Mitral Valve: The mitral valve is normal in structure. No evidence of mitral valve regurgitation. Tricuspid Valve: The tricuspid valve is normal in structure. Tricuspid valve regurgitation is trivial. Aortic Valve: The aortic valve is normal in structure. Aortic valve regurgitation is trivial. Aortic valve mean gradient measures 7.0 mmHg. Aortic valve peak gradient measures 13.4 mmHg. Aortic valve area, by VTI measures 1.97 cm. Pulmonic Valve: The pulmonic valve was normal in structure. Pulmonic valve regurgitation is not visualized. Aorta: Aortic dilatation noted. There is mild dilatation of the ascending aorta, measuring 40 mm. IAS/Shunts: No atrial level shunt detected by color flow Doppler. Additional Comments: 3D was  performed not requiring image post processing on an independent workstation and was indeterminate.  LEFT VENTRICLE PLAX 2D LVIDd:         3.70 cm   Diastology LVIDs:         2.40 cm   LV e' medial:    6.31 cm/s LV PW:         1.00 cm   LV E/e' medial:  8.9 LV IVS:        1.00 cm   LV e' lateral:   8.05 cm/s LVOT diam:     2.00 cm  LV E/e' lateral: 7.0 LV SV:         62 LV SV Index:   38 LVOT Area:     3.14 cm  IVC IVC diam: 1.50 cm LEFT ATRIUM             Index        RIGHT ATRIUM           Index LA diam:        2.70 cm 1.67 cm/m   RA Area:     11.50 cm LA Vol (A2C):   34.7 ml 21.42 ml/m  RA Volume:   23.20 ml  14.32 ml/m LA Vol (A4C):   55.1 ml 34.01 ml/m LA Biplane Vol: 46.1 ml 28.46 ml/m  AORTIC VALVE AV Area (Vmax):    1.96 cm AV Area (Vmean):   1.93 cm AV Area (VTI):     1.97 cm AV Vmax:           183.00 cm/s AV Vmean:          120.000 cm/s AV VTI:            0.314 m AV Peak Grad:      13.4 mmHg AV Mean Grad:      7.0 mmHg LVOT Vmax:         114.00 cm/s LVOT Vmean:        73.900 cm/s LVOT VTI:          0.197 m LVOT/AV VTI ratio: 0.63  AORTA Ao Root diam: 3.10 cm MITRAL VALVE               TRICUSPID VALVE MV Area (PHT): 3.43 cm    TR Peak grad:   24.0 mmHg MV Decel Time: 221 msec    TR Vmax:        245.00 cm/s MV E velocity: 56.20 cm/s MV A velocity: 98.50 cm/s  SHUNTS MV E/A ratio:  0.57        Systemic VTI:  0.20 m                            Systemic Diam: 2.00 cm Cara JONETTA Lovelace MD Electronically signed by Cara JONETTA Lovelace MD Signature Date/Time: 10/20/2024/2:58:54 PM    Final (Updated)    CT Cervical Spine Wo Contrast Addendum Date: 10/20/2024 ADDENDUM: Study discussed by telephone with Doctor Clarine at [facility] on October 20, 2024. Electronically signed by: Helayne Hurst MD MD 10/20/2024 01:23 PM EST RP Workstation: [id number]   Result Date: 10/20/2024 EXAM: CT CERVICAL SPINE WITHOUT CONTRAST 10/20/2024 11:14:49 AM TECHNIQUE: CT of the cervical spine was performed without the administration of  intravenous contrast. Multiplanar reformatted images are provided for review. Automated exposure control, iterative reconstruction, and/or weight based adjustment of the mA/kV was utilized to reduce the radiation dose to as low as reasonably achievable. COMPARISON: Head CT 10/20/2024, reported separately. Cervical spine CT 07/28/2024. CT chest 10/20/2024, reported separately. CLINICAL HISTORY: 84 year old female with recurrent falls, found down. FINDINGS: BONES AND ALIGNMENT: Stable chronic straightening of cervical lordosis. No acute traumatic malalignment. No acute vertebral fracture is identified in the cervical spine. T3 superior endplate compression fracture is new since October, age indeterminate. This represents 22% loss of vertebral body height. No retropulsion. Posterior elements of T3 appear intact and aligned. More subtle compression of the T2 superior endplate also appears new since October. Posterior elements of T2 appear intact and aligned. DEGENERATIVE CHANGES: Mild for age cervical  spine degeneration appears stable. Ordinary cervical carotid calcified atherosclerosis. SOFT TISSUES: Positive for mild new prevertebral edema (series 4 image 42) most pronounced from the C2-C3 through the right C3-C4 levels. Otherwise negative visible non-contrast neck soft tissues. No CT evidence of cervical spinal canal hemorrhage. IMPRESSION: 1. Positive for mild prevertebral edema from C2 to C4, compatible with acute anterior ligamentous injury in this setting. No superimposed acute cervical spine fracture. 2. New since October and age indeterminant T3 (22% loss of vertebral height) and subtle T2 superior endplate compression fractures. No retropulsion or complicating features. Electronically signed by: Helayne Hurst MD MD 10/20/2024 01:06 PM EST RP Workstation: [id number]   CT CHEST ABDOMEN PELVIS WO CONTRAST Result Date: 10/20/2024 EXAM: CT CHEST, ABDOMEN AND PELVIS WITHOUT CONTRAST 10/20/2024 11:14:49 AM TECHNIQUE:  CT of the chest, abdomen and pelvis was performed without the administration of intravenous contrast. Multiplanar reformatted images are provided for review. Automated exposure control, iterative reconstruction, and/or weight based adjustment of the mA/kV was utilized to reduce the radiation dose to as low as reasonably achievable. COMPARISON: 01/19/2020 CLINICAL HISTORY: Recurrent falls, weakness, abdominal pain --evaluate for traumatic injury, underlying infection or source of pain. FINDINGS: CHEST: MEDIASTINUM AND LYMPH NODES: Moderate cardiomegaly. Dense multivessel coronary atherosclerosis. Moderate volume pericardial effusion. The central airways are clear. No mediastinal, hilar or axillary lymphadenopathy. LUNGS AND PLEURA: Small bilateral pleural effusions with bibasilar compressive atelectasis. No focal consolidation or pulmonary edema. No pneumothorax. ABDOMEN AND PELVIS: LIVER: Unremarkable. GALLBLADDER AND BILE DUCTS: Unremarkable. No biliary ductal dilatation. SPLEEN: No acute abnormality. PANCREAS: Pancreatic parenchymal atrophy. ADRENAL GLANDS: No acute abnormality. KIDNEYS, URETERS AND BLADDER: Unchanged appearance of bilateral renal cysts, a couple of which are proteinaceous or hemorrhagic. Diffuse renal cortical atrophy on the left, slightly worse in the interim. No stones in the kidneys or ureters. No hydronephrosis. No perinephric or periureteral stranding. Urinary bladder is unremarkable. GI AND BOWEL: Stomach demonstrates no acute abnormality. There is no bowel obstruction. There is a larger left femoral hernia containing a couple of segments of small bowel. REPRODUCTIVE ORGANS: Hysterectomy. No acute abnormality. PERITONEUM AND RETROPERITONEUM: No ascites. No free air. VASCULATURE: Aorta is normal in caliber. Aortoiliac atherosclerosis. ABDOMINAL AND PELVIS LYMPH NODES: No lymphadenopathy. BONES AND SOFT TISSUES: Nondisplaced buckle type fractures of the anterior right 4 to 6 ribs. Mildly  displaced fracture of the left lateral 11th rib. A couple of remote left sided rib fractures noted. Exaggerated thoracic kyphosis. Unchanged mild chronic compression fracture of the mid to upper thoracic spine. Multilevel thoracic laminectomy changes. Multilevel degenerative disc disease. Osteopenia. Small fat containing right femoral hernia. No focal soft tissue abnormality. IMPRESSION: 1. Nondisplaced buckle type fractures of the anterior right 4 to 6 ribs and mildly displaced fracture of the left lateral 11th rib. 2. Small bilateral pleural effusions with bibasilar compressive atelectasis. No pneumothorax. 3. Left femoral hernia containing a couple of segments of small bowel. No findings of vascular compromise or upstream bowel obstruction. 4. Moderate cardiomegaly and moderate volume pericardial effusion. Electronically signed by: Rogelia Myers MD MD 10/20/2024 12:27 PM EST RP Workstation: [id number]   CT HEAD WO CONTRAST ( ) Result Date: 10/20/2024 EXAM: CT HEAD WITHOUT CONTRAST 10/20/2024 11:14:49 AM TECHNIQUE: CT of the head was performed without the administration of intravenous contrast. Automated exposure control, iterative reconstruction, and/or weight based adjustment of the mA/kV was utilized to reduce the radiation dose to as low as reasonably achievable. COMPARISON: Brain MRI 06/22/2018, Head CT 10/16/2024. CLINICAL HISTORY: 84 year old female. Recurrent falls, found down. FINDINGS: BRAIN  AND VENTRICLES: No acute hemorrhage. No evidence of acute infarct. No hydrocephalus. No extra-axial collection. No mass effect or midline shift. Mega Cisterna Magna, normal variant. Stable cerebral volume. Small but circumscribed chronic appearing lacunar infarct in the right thalamus is new from the previous MRI but stable recently (coronal image 35). Elsewhere gray white differentiation is within normal limits for age. Faint Basal Ganglia vascular calcifications. No suspicious intracranial vascular  hyperdensity. ORBITS: No acute abnormality. SINUSES: Minor paranasal sinus mucosal thickening, significant stoutful. Tympanic cavities and mastoids remain well aerated. SOFT TISSUES AND SKULL: Mild right vertex scalp soft tissue swelling on series 4 image 71. Calcified atherosclerosis at the skull base. No skull fracture. IMPRESSION: 1. Right vertex mild scalp soft tissue injury. 2. No acute intracranial abnormality identified. Small chronic right thalamic lacunar infarct. Electronically signed by: Helayne Hurst MD MD 10/20/2024 11:42 AM EST RP Workstation: [id number]   DG Chest Portable 1 View Result Date: 10/20/2024 EXAM: 1 VIEW(S) XRAY OF THE CHEST 10/20/2024 10:34:00 AM COMPARISON: 09/29/2024 CLINICAL HISTORY: trauma FINDINGS: LINES, TUBES AND DEVICES: Multiple wires and leads project over the chest on the frontal radiograph. LUNGS AND PLEURA: Overlying pleural thickening or trace fluid associated with suspected right rib fractures. Moderate left hemidiaphragm elevation. Left greater than right base subsegmental atelectasis and or scar. No pneumothorax. HEART AND MEDIASTINUM: Moderate cardiomegaly. No congestive heart failure. BONES AND SOFT TISSUES: Left shoulder arthroplasty. Suspect lateral mid right rib fractures. IMPRESSION: 1. Suspicion of right lateral rib fractures with overlying pleural thickening or fluid. CT is pending. 2. Cardiomegaly without congestive heart failure. Electronically signed by: Rockey Kilts MD MD 10/20/2024 11:20 AM EST RP Workstation: [id number]   DG Hip Unilat W or Wo Pelvis 2-3 Views Left Result Date: 10/20/2024 EXAM: 2 or more VIEW(S) XRAY OF THE PELVIS AND LEFT HIP 10/20/2024 10:34:00 AM COMPARISON: None available. CLINICAL HISTORY: trauma FINDINGS: BONES AND JOINTS: SI joints are symmetric. No acute fracture. Bilateral hips demonstrate normal alignment. LUMBAR SPINE: Degenerative changes of the lower lumbar spine. SOFT TISSUES: Calcifications overlying the pelvis consistent  with phleboliths. IMPRESSION: 1. No evidence of acute traumatic injury. Electronically signed by: Rockey Kilts MD MD 10/20/2024 11:17 AM EST RP Workstation: [id number]     Echo preserved left ventricular function EF of 60%  TELEMETRY: Sinus bradycardia rate of 60:  ASSESSMENT AND PLAN:  Principal Problem:   Fall Active Problems:   Dementia with behavioral disturbance (HCC)   CKD stage 3b, GFR 30-44 ml/min (HCC)   Fall at home, initial encounter   Hypernatremia   Cervical spinal stenosis   Hypoglycemia   Failure to thrive in adult    Plan Paroxysmal atrial fibrillation would recommend holding Eliquis  because of frequent falls consider reducing metoprolol  because of relative bradycardia Recommend discontinuing Crestor  for hyperlipidemia patient unlikely to benefit from continued therapy Maintain adequate hydration for renal insufficiency Continue to treat and avoid hyperthermia No clear evidence of significant pericardial effusion recommend conservative management No further cardiac or diagnostic workup at this stage Consider continue hospice and comfort measures   Cara JONETTA Lovelace, MD 10/21/2024 2:43 PM

## 2024-10-21 NOTE — ED Notes (Signed)
 Pt ABCs intact. RR even and unlabored. Pt in NAD. Bed in lowest locked position. Call bell in reach. Denies needs at this time.

## 2024-10-21 NOTE — ED Notes (Signed)
 Pt resting in bed at this time. Pt ABCs intact. RR even and unlabored. Pt in NAD. Bed in lowest locked position. Call bell in reach. Denies needs at this time.

## 2024-10-21 NOTE — Evaluation (Signed)
 Physical Therapy Evaluation Patient Details Name: Laura Bishop MRN: [id number] DOB: 05-05-1941 Today's Date: 10/21/2024  History of Present Illness  Laura Bishop is a 84 y.o. female with medical history significant of advanced dementia, HTN, CKD stage IIIb, hypothyroidism, chronic ambulation impairment, PAF on Eliquis , stroke, brought in by family member for evaluation of frequent falls, new onset of back pain.  Clinical Impression  Patient noted to be in supine position at PT arrival in room, for an initial PT evaluation due to a decline in functional status, with baseline mobility reported as needing assistance with history of falls, and currently requiring maxA for transfers. Pt presenting with good willingness to work with PT. The patient resides in a house and lives spouse with family/friend support. There are 2 STE inside the residence. The overall clinical impression is that the patient presents with moderate to severe mobility limitations. Recommended skilled PT will address safety, mobility, and discharge planning.        If plan is discharge home, recommend the following: A little help with walking and/or transfers;A little help with bathing/dressing/bathroom;Help with stairs or ramp for entrance;Assist for transportation;Assistance with feeding;Assistance with cooking/housework;Supervision due to cognitive status   Can travel by private vehicle   Yes    Equipment Recommendations Rolling walker (2 wheels);BSC/3in1  Recommendations for Other Services       Functional Status Assessment Patient has had a recent decline in their functional status and demonstrates the ability to make significant improvements in function in a reasonable and predictable amount of time.     Precautions / Restrictions Precautions Precautions: Fall Recall of Precautions/Restrictions: Impaired Precaution/Restrictions Comments: hx of progressing dementia Restrictions Weight Bearing Restrictions  Per Provider Order: No      Mobility  Bed Mobility Overal bed mobility: Needs Assistance Bed Mobility: Supine to Sit, Sit to Supine     Supine to sit: Used rails, HOB elevated, Max assist, +2 for physical assistance Sit to supine: Max assist, +2 for physical assistance        Transfers Overall transfer level: Needs assistance Equipment used: 2 person hand held assist Transfers: Sit to/from Stand, Bed to chair/wheelchair/BSC Sit to Stand: Max assist   Step pivot transfers: Max assist, +2 physical assistance            Ambulation/Gait                  Stairs            Wheelchair Mobility     Tilt Bed    Modified Rankin (Stroke Patients Only)       Balance Overall balance assessment: Needs assistance Sitting-balance support: Feet supported Sitting balance-Leahy Scale: Good     Standing balance support: Reliant on assistive device for balance, During functional activity, Single extremity supported, No upper extremity supported Standing balance-Leahy Scale: Fair                               Pertinent Vitals/Pain      Home Living Family/patient expects to be discharged to:: Private residence Living Arrangements: Spouse/significant other Available Help at Discharge: Family;Available 24 hours/day Type of Home: House Home Access: Stairs to enter Entrance Stairs-Rails: Right Entrance Stairs-Number of Steps: 2   Home Layout: One level   Additional Comments: used [facility] when had HH prior to admission    Prior Function Prior Level of Function : Independent/Modified Independent;History of Falls (last six months)  Mobility Comments: [facility] with HH; fell one week within leaving SNF ADLs Comments: per husband pt was dependent in bathing and dressing - hed had to help toiletting, and meals but she did not heat - no appetite     Extremity/Trunk Assessment   Upper Extremity Assessment Upper Extremity Assessment: Defer to  OT evaluation    Lower Extremity Assessment Lower Extremity Assessment: Generalized weakness    Cervical / Trunk Assessment Cervical / Trunk Assessment: Kyphotic  Communication   Communication Communication: No apparent difficulties    Cognition Arousal: Alert Behavior During Therapy: Flat affect   PT - Cognitive impairments: History of cognitive impairments, Orientation, Awareness, Problem solving, Safety/Judgement   Orientation impairments: Person, Place, Time                   PT - Cognition Comments: mod encoragement to participate Following commands: Impaired Following commands impaired: Follows one step commands inconsistently, Follows one step commands with increased time     Cueing Cueing Techniques: Tactile cues, Verbal cues, Gestural cues     General Comments      Exercises     Assessment/Plan    PT Assessment Patient needs continued PT services  PT Problem List Decreased strength;Decreased activity tolerance;Decreased mobility;Decreased balance       PT Treatment Interventions Gait training;Stair training;Functional mobility training;Therapeutic activities;Therapeutic exercise;Balance training;Neuromuscular re-education;Patient/family education    PT Goals (Current goals can be found in the Care Plan section)  Acute Rehab PT Goals PT Goal Formulation: Patient unable to participate in goal setting    Frequency Min 2X/week     Co-evaluation               AM-PAC PT 6 Clicks Mobility  Outcome Measure Help needed turning from your back to your side while in a flat bed without using bedrails?: None Help needed moving from lying on your back to sitting on the side of a flat bed without using bedrails?: A Little Help needed moving to and from a bed to a chair (including a wheelchair)?: A Little Help needed standing up from a chair using your arms (e.g., wheelchair or bedside chair)?: A Little Help needed to walk in hospital room?: A  Little Help needed climbing 3-5 steps with a railing? : A Lot 6 Click Score: 18    End of Session Equipment Utilized During Treatment: Gait belt Activity Tolerance: Patient limited by fatigue Patient left: in bed;with bed alarm set;with call bell/phone within reach Nurse Communication: Mobility status PT Visit Diagnosis: Other abnormalities of gait and mobility (R26.89);Muscle weakness (generalized) (M62.81);Difficulty in walking, not elsewhere classified (R26.2);History of falling (Z91.81);Unsteadiness on feet (R26.81)    Time: 9154-9093 PT Time Calculation (min) (ACUTE ONLY): 21 min   Charges:   PT Evaluation $PT Eval Low Complexity: 1 Low   PT General Charges $$ ACUTE PT VISIT: 1 Visit         Sherlean Lesches DPT, PT    Sherlean A Adeline Petitfrere 10/21/2024, 9:42 AM

## 2024-10-21 NOTE — Evaluation (Signed)
 Occupational Therapy Evaluation Patient Details Name: Laura Bishop MRN: [id number] DOB: October 31, 1940 Today's Date: 10/21/2024   History of Present Illness   Laura Bishop is a 84 y.o. female with medical history significant of advanced dementia, HTN, CKD stage IIIb, hypothyroidism, chronic ambulation impairment, PAF on Eliquis , stroke, brought in by family member for evaluation of frequent falls, new onset of back pain.     Clinical Impressions Pt was seen for OT evaluation this date. Prior to hospital admission, pt was requiring assist for most ADL and mobility. She was able to feed herself per chart. Pt presents with deficits in strength, activity tolerance, balance, and cognition limiting her ability to perform ADL management at baseline level. Pt currently requires +2 assist for bed mobility and SPT, MAX A for LB ADL, and MAX A for seated UB ADL.  Pt may benefit from skilled OT services to address noted impairments and functional limitations (see below for any additional details) in order to maximize safety and independence while minimizing future risk of falls, injury, and readmission. Anticipate the need for follow up OT services upon acute hospital DC.     If plan is discharge home, recommend the following:   A lot of help with bathing/dressing/bathroom;Assistance with cooking/housework;Assist for transportation;Help with stairs or ramp for entrance;Direct supervision/assist for medications management;Direct supervision/assist for financial management;Two people to help with walking and/or transfers;Supervision due to cognitive status     Functional Status Assessment   Patient has had a recent decline in their functional status and demonstrates the ability to make significant improvements in function in a reasonable and predictable amount of time.     Equipment Recommendations   Other (comment) (defer)     Recommendations for Other Services          Precautions/Restrictions   Precautions Precautions: Fall Recall of Precautions/Restrictions: Impaired Precaution/Restrictions Comments: hx of progressing dementia Restrictions Weight Bearing Restrictions Per Provider Order: No     Mobility Bed Mobility Overal bed mobility: Needs Assistance Bed Mobility: Supine to Sit, Sit to Supine     Supine to sit: Used rails, HOB elevated, Max assist, +2 for physical assistance Sit to supine: Max assist, +2 for physical assistance        Transfers Overall transfer level: Needs assistance Equipment used: 2 person hand held assist Transfers: Sit to/from Stand, Bed to chair/wheelchair/BSC Sit to Stand: Max assist, +2 safety/equipment     Step pivot transfers: Max assist, +2 physical assistance            Balance Overall balance assessment: Needs assistance Sitting-balance support: Feet supported Sitting balance-Leahy Scale: Fair     Standing balance support: Reliant on assistive device for balance, During functional activity, Single extremity supported, No upper extremity supported Standing balance-Leahy Scale: Poor                             ADL either performed or assessed with clinical judgement   ADL Overall ADL's : Needs assistance/impaired                 Upper Body Dressing : Sitting;Maximal assistance   Lower Body Dressing: Sit to/from stand;Maximal assistance   Toilet Transfer: Maximal assistance;+2 for physical assistance;Stand-pivot Toilet Transfer Details (indicate cue type and reason): simulated                 Vision         Perception  Praxis         Pertinent Vitals/Pain Pain Assessment Pain Assessment: Faces Faces Pain Scale: Hurts a little bit Pain Location: unclear Pain Descriptors / Indicators: Grimacing Pain Intervention(s): Monitored during session     Extremity/Trunk Assessment Upper Extremity Assessment Upper Extremity Assessment: Generalized  weakness   Lower Extremity Assessment Lower Extremity Assessment: Generalized weakness   Cervical / Trunk Assessment Cervical / Trunk Assessment: Kyphotic   Communication Communication Communication: No apparent difficulties   Cognition Arousal: Alert Behavior During Therapy: Flat affect Cognition: History of cognitive impairments, Cognition impaired   Orientation impairments: Place, Time, Situation         OT - Cognition Comments: hx dementia, oriented to self, requires multimodal cues for simple commands                 Following commands: Impaired Following commands impaired: Follows one step commands inconsistently, Follows one step commands with increased time     Cueing  General Comments   Cueing Techniques: Tactile cues;Verbal cues;Gestural cues      Exercises     Shoulder Instructions      Home Living Family/patient expects to be discharged to:: Private residence Living Arrangements: Spouse/significant other Available Help at Discharge: Family;Available 24 hours/day Type of Home: House Home Access: Stairs to enter [employer] of Steps: 2 Entrance Stairs-Rails: Right Home Layout: One level     Bathroom Shower/Tub: Walk-in shower;Tub/shower [employer] Accessibility: Yes       Additional Comments: used [facility] when had HH prior to admission      Prior Functioning/Environment Prior Level of Function : Independent/Modified Independent;History of Falls (last six months) (from previous chart review)             Mobility Comments: [facility] with HH; fell one week within leaving SNF ADLs Comments: per husband pt was dependent in bathing and dressing - he had to help with toileting, and meals but she did not eat - no appetite    OT Problem List: Decreased activity tolerance;Decreased safety awareness;Decreased range of motion;Decreased cognition;Decreased strength;Decreased knowledge of use of DME or  AE;Impaired balance (sitting and/or standing)   OT Treatment/Interventions: Self-care/ADL training;Therapeutic exercise;Neuromuscular education;Patient/family education;Balance training;Therapeutic activities;DME and/or AE instruction;Cognitive remediation/compensation      OT Goals(Current goals can be found in the care plan section)   Acute Rehab OT Goals Patient Stated Goal: pt does not state OT Goal Formulation: Patient unable to participate in goal setting Time For Goal Achievement: 11/04/24 Potential to Achieve Goals: Fair ADL Goals Pt Will Perform Eating: with set-up;with supervision;sitting Pt Will Transfer to Toilet: with min assist;ambulating (LRAD) Pt Will Perform Toileting - Clothing Manipulation and hygiene: with caregiver independent in assisting;sitting/lateral leans;sit to/from stand   OT Frequency:  Min 2X/week    Co-evaluation PT/OT/SLP Co-Evaluation/Treatment: Yes Reason for Co-Treatment: For patient/therapist safety;To address functional/ADL transfers PT goals addressed during session: Mobility/safety with mobility;Balance;Proper use of DME OT goals addressed during session: ADL's and self-care;Proper use of Adaptive equipment and DME      AM-PAC OT 6 Clicks Daily Activity     Outcome Measure Help from another person eating meals?: A Little Help from another person taking care of personal grooming?: A Little Help from another person toileting, which includes using toliet, bedpan, or urinal?: Total Help from another person bathing (including washing, rinsing, drying)?: A Lot Help from another person to put on and taking off regular upper body clothing?: A Lot Help from another person to put  on and taking off regular lower body clothing?: A Lot 6 Click Score: 13   End of Session    Activity Tolerance: Patient tolerated treatment well Patient left: in bed;with call bell/phone within reach;with bed alarm set;with nursing/sitter in room  OT Visit Diagnosis:  History of falling (Z91.81);Muscle weakness (generalized) (M62.81);Cognitive communication deficit (R41.841)                Time: 9151-9093 OT Time Calculation (min): 18 min Charges:  OT General Charges $OT Visit: 1 Visit OT Evaluation $OT Eval Low Complexity: 1 Low  Warren SAUNDERS., MPH, MS, OTR/L ascom [phone] 10/21/2024, 12:48 PM

## 2024-10-21 NOTE — Progress Notes (Addendum)
" °  Progress Note   Patient: Laura Bishop FMW:[id number] DOB: 19-Oct-1940 DOA: 10/20/2024     0 DOS: the patient was seen and examined on 10/21/2024   Brief hospital course: Laura Bishop is a 84 y.o. female with medical history significant of advanced dementia, HTN, CKD stage IIIb, hypothyroidism, chronic ambulation impairment, PAF on Eliquis , stroke, brought in by family member for evaluation of frequent falls, new onset of back pain.  Patient has advanced dementia, has been eating very little.  Patient currently followed by hospice as outpatient, per husband's request, will transition to comfort care.   Principal Problem:   Fall Active Problems:   Dementia with behavioral disturbance (HCC)   CKD stage 3b, GFR 30-44 ml/min (HCC)   Fall at home, initial encounter   Hypernatremia   Cervical spinal stenosis   Hypoglycemia   Assessment and Plan: Advanced dementia with delirium. Failure to thrive. Frequent falls. Bilateral rib fractures from the fall. Severe cervical spine stenosis. Patient has advanced dementia, condition has been deteriorating significantly.  Patient eats very little, very confused.  She has been followed by hospice as outpatient, per husband request, will transition to comfort care.  Chronic kidney disease stage IIIb. Hypernatremia. Severe metabolic acidosis. Hypoglycemia  Conditions are caused by dehydration from no p.o. intake.  Paroxysmal atrial fibrillation. Acute on chronic diastolic congestive heart failure. Cardiogram showed ejection fraction 60 to 65% with grade 1 diastolic dysfunction.  No additional treatment.    Subjective:  Patient is completely confused, not following commands.  Physical Exam: Vitals:   10/21/24 0630 10/21/24 0700 10/21/24 0725 10/21/24 1030  BP: 127/66 117/62  103/65  Pulse: 90 89  89  Resp:  18  20  Temp:      TempSrc:      SpO2: 95% 98% 95% 95%  Weight:      Height:       General exam: Ill-appearing,  uncomfortable. Respiratory system: Clear to auscultation. Respiratory effort normal. Cardiovascular system: S1 & S2 heard, RRR. No JVD, murmurs, rubs, gallops or clicks. No pedal edema. Gastrointestinal system: Abdomen is nondistended, soft and nontender. No organomegaly or masses felt. Normal bowel sounds heard. Central nervous system: Confused with occasional agitation. Extremities: Symmetric 5 x 5 power. Skin: No rashes, lesions or ulcers Psychiatry: Flat affect   Data Reviewed:    Family Communication: Husband updated at bedside.  Disposition: Status is: Observation      Time spent: 55 minutes  Author: Murvin Mana, MD 10/21/2024 11:55 AM  For on call review [email or web address].    "

## 2024-10-21 NOTE — ED Notes (Signed)
 Pt ABCs intact. RR even and unlabored. Pt in NAD. Bed in lowest locked position. Call bell in reach.

## 2024-10-21 NOTE — ED Notes (Signed)
 Pt removed from bear-warmer at this time.

## 2024-10-21 NOTE — ED Notes (Signed)
 Fall risk bundle is currently in place.

## 2024-10-21 NOTE — NC FL2 (Signed)
 " [place]  MEDICAID FL2 LEVEL OF CARE FORM     IDENTIFICATION  Patient Name: Laura Bishop Birthdate: 04-26-1941 Sex: female Admission Date (Current Location): 10/20/2024  [facility] and [place] Number:  Chiropodist and Address:  [facility], [address] [place], [place] [address]      Provider Number: [id number]  Attending Physician Name and Address:  Laurita Pillion, MD  Relative Name and Phone Number:  Becka, [place]  Spouse, Emergency Contact  [phone]    Current Level of Care: Hospital Recommended Level of Care: Skilled Nursing Facility Prior Approval Number:    Date Approved/Denied:   PASRR Number: [id number]  Discharge Plan: SNF    Current Diagnoses: Patient Active Problem List   Diagnosis Date Noted   Hypernatremia 10/21/2024   Cervical spinal stenosis 10/21/2024   Hypoglycemia 10/21/2024   Fall at home, initial encounter 10/20/2024   Fall 10/20/2024   Hypophosphatemia 10/07/2024   Acute metabolic encephalopathy 10/05/2024   Pressure injury of skin 10/05/2024   Dementia with behavioral disturbance (HCC) 10/05/2024   CKD stage 3b, GFR 30-44 ml/min (HCC) 10/05/2024   Metabolic acidosis 10/05/2024   Hypokalemia 10/05/2024   UTI (urinary tract infection) 09/29/2024    Orientation RESPIRATION BLADDER Height & Weight     Self  Normal Incontinent Weight: 143 lb 4.8 oz (65 kg) Height:  5' (152.4 cm)  BEHAVIORAL SYMPTOMS/MOOD NEUROLOGICAL BOWEL NUTRITION STATUS      Continent Diet (Diet Heart Room service appropriate? Yes; Fluid consistency: Thin: Cardiac starting at 01/09 1352)  AMBULATORY STATUS COMMUNICATION OF NEEDS Skin   Extensive Assist Verbally Bruising                       Personal Care Assistance Level of Assistance  Bathing, Feeding, Dressing Bathing Assistance: Maximum assistance Feeding assistance: Limited assistance Dressing Assistance: Maximum assistance     Functional Limitations Info  Sight,  Hearing, Speech Sight Info: Adequate Hearing Info: Adequate Speech Info: Adequate    SPECIAL CARE FACTORS FREQUENCY  PT (By licensed PT), OT (By licensed OT)     PT Frequency: 5x OT Frequency: 5x            Contractures Contractures Info: Not present    Additional Factors Info  Code Status, Allergies Code Status Info: DNR Allergies Info: Estrogens, NSAIDS           Current Medications (10/21/2024):  This is the current hospital active medication list Current Facility-Administered Medications  Medication Dose Route Frequency Provider Last Rate Last Admin   acetaminophen  (TYLENOL ) tablet 650 mg  650 mg Oral Q6H PRN Laurita Manor T, MD       Or   acetaminophen  (TYLENOL ) suppository 650 mg  650 mg Rectal Q6H PRN Laurita Manor T, MD       dextrose  5 % 1,000 mL with sodium bicarbonate  75 mEq infusion   Intravenous Continuous Zhang, Dekui, MD 50 mL/hr at 10/21/24 1038 New Bag at 10/21/24 1038   fesoterodine  (TOVIAZ ) tablet 4 mg  4 mg Oral Daily Laurita Manor T, MD   4 mg at 10/21/24 1026   furosemide  (LASIX ) injection 40 mg  40 mg Intravenous Daily Zhang, Dekui, MD   40 mg at 10/21/24 0958   heparin  injection 5,000 Units  5,000 Units Subcutaneous Q12H Laurita Manor T, MD   5,000 Units at 10/21/24 9044   HYDROmorphone  (DILAUDID ) injection 0.5 mg  0.5 mg Intravenous Q4H PRN Laurita Manor DASEN, MD  levothyroxine  (SYNTHROID ) tablet 75 mcg  75 mcg Oral QAC breakfast Laurita Manor T, MD   75 mcg at 10/21/24 9046   lidocaine  (LIDODERM ) 5 % 1 patch  1 patch Transdermal Q24H Laurita Manor T, MD   1 patch at 10/20/24 1514   LORazepam  (ATIVAN ) injection 0.5 mg  0.5 mg Intravenous Q6H PRN Laurita Manor T, MD       mirtazapine  (REMERON ) tablet 7.5 mg  7.5 mg Oral QHS Laurita Manor T, MD       ondansetron  (ZOFRAN ) tablet 4 mg  4 mg Oral Q6H PRN Laurita Manor DASEN, MD       Or   ondansetron  (ZOFRAN ) injection 4 mg  4 mg Intravenous Q6H PRN Laurita Manor T, MD       QUEtiapine  (SEROQUEL ) tablet 25 mg  25 mg Oral QHS  Laurita Manor T, MD       rosuvastatin  (CRESTOR ) tablet 5 mg  5 mg Oral Daily Laurita Manor T, MD   5 mg at 10/21/24 1027   senna-docusate (Senokot-S) tablet 1 tablet  1 tablet Oral QHS PRN Laurita Manor DASEN, MD       traZODone  (DESYREL ) tablet 25 mg  25 mg Oral QHS PRN Laurita Manor DASEN, MD       Current Outpatient Medications  Medication Sig Dispense Refill   acetaminophen  (TYLENOL ) 500 MG tablet Take by mouth.     amLODipine  (NORVASC ) 10 MG tablet Take 10 mg by mouth daily.     ELIQUIS  2.5 MG TABS tablet Take 2.5 mg by mouth 2 (two) times daily.     gabapentin (NEURONTIN) 300 MG capsule Take 300 mg by mouth 2 (two) times daily as needed.     levothyroxine  (SYNTHROID , LEVOTHROID) 75 MCG tablet Take 75 mcg by mouth daily before breakfast.     lidocaine  (LIDODERM ) 5 % Place 1 patch onto the skin daily.     metoprolol  succinate (TOPROL -XL) 25 MG 24 hr tablet Take 0.5 tablets (12.5 mg total) by mouth daily. 30 tablet 0   mirtazapine  (REMERON ) 7.5 MG tablet Take 1 tablet (7.5 mg total) by mouth at bedtime. 30 tablet 0   Multiple Vitamin (MULTIVITAMIN) tablet Take 1 tablet by mouth daily.     oxyCODONE (OXY IR/ROXICODONE) 5 MG immediate release tablet Take 5 mg by mouth every 6 (six) hours as needed.     QUEtiapine  (SEROQUEL ) 25 MG tablet Take 1 tablet (25 mg total) by mouth at bedtime. 14 tablet 0   rosuvastatin  (CRESTOR ) 5 MG tablet Take 5 mg by mouth daily.     solifenacin (VESICARE) 10 MG tablet Take 10 mg by mouth daily.     diphenhydramine -acetaminophen  (TYLENOL  PM) 25-500 MG TABS tablet Take 1 tablet by mouth at bedtime as needed.     VITAMIN D  PO Take 125 mcg by mouth daily.       Discharge Medications: Please see discharge summary for a list of discharge medications.  Relevant Imaging Results:  Relevant Lab Results:   Additional Information [id number]  Laura Leaming L Severus Brodzinski, LCSW     "

## 2024-10-21 NOTE — ED Notes (Signed)
 Care hand off to [place], [place]

## 2024-10-21 NOTE — ED Notes (Signed)
 CBG 59, Given one cup of juice. consumed

## 2024-10-22 DIAGNOSIS — F03918 Unspecified dementia, unspecified severity, with other behavioral disturbance: Secondary | ICD-10-CM | POA: Diagnosis not present

## 2024-10-22 DIAGNOSIS — R627 Adult failure to thrive: Secondary | ICD-10-CM

## 2024-10-22 DIAGNOSIS — E870 Hyperosmolality and hypernatremia: Secondary | ICD-10-CM | POA: Diagnosis not present

## 2024-10-22 DIAGNOSIS — M4802 Spinal stenosis, cervical region: Secondary | ICD-10-CM

## 2024-10-22 NOTE — Progress Notes (Signed)
 [facility] Liaison Note  Ms. Mckeen is a current palliative patient who experienced a fall at home on 1.9.26. She has frequent falls and now has right  4-6 & 11 rib fx.  Was asked to evaluate Ms. Dasie for hospice IPU. (InPatient Unit).  Visited patient at bedside.  Patient's RN and NT were at bedside finishing up bath and changing her.  Patient awake and pleasantly confused, however, will follow some commands.  She is not interested in eating per NT- however, patient needs assistance.  Patient also has regular diet on her bedside table that was sliced beef, baked potatoe and long green beans.  NT said she just holds food.  Suggested patient may need change in diet.  Patient did receive 1 dose of robinul  this morning by the RN due to patient coughing.  She expresses earlier, patient was agitated, moving around, trying to get out of bed and IV ativan  was given x 1.    Notified Dr. Laurita that we would re-evaluate her again in 24 hours. No family at bedside.  Hospital liaison team will continue to follow and evaluate for IPU appropriateness.  Please call with any questions or concerns. Saddie HILARIO Na, RN Nurse Liaison [phone]

## 2024-10-22 NOTE — Progress Notes (Signed)
 This RN spoke with patient's daughter to update patient password.

## 2024-10-22 NOTE — Plan of Care (Signed)
  Problem: Clinical Measurements: Goal: Ability to maintain clinical measurements within normal limits will improve Outcome: Progressing   Problem: Pain Managment: Goal: General experience of comfort will improve and/or be controlled Outcome: Progressing   Problem: Safety: Goal: Ability to remain free from injury will improve Outcome: Progressing   Problem: Skin Integrity: Goal: Risk for impaired skin integrity will decrease Outcome: Progressing

## 2024-10-22 NOTE — Progress Notes (Signed)
" °  Progress Note   Patient: Laura Bishop FMW:[id number] DOB: 07/16/1941 DOA: 10/20/2024     1 DOS: the patient was seen and examined on 10/22/2024   Brief hospital course: Laura Bishop is a 84 y.o. female with medical history significant of advanced dementia, HTN, CKD stage IIIb, hypothyroidism, chronic ambulation impairment, PAF on Eliquis , stroke, brought in by family member for evaluation of frequent falls, new onset of back pain.  Patient has advanced dementia, has been eating very little.  Patient currently followed by hospice as outpatient, per husband's request, will transition to comfort care.   Principal Problem:   Fall Active Problems:   Dementia with behavioral disturbance (HCC)   CKD stage 3b, GFR 30-44 ml/min (HCC)   Fall at home, initial encounter   Hypernatremia   Cervical spinal stenosis   Hypoglycemia   Failure to thrive in adult   Assessment and Plan: Advanced dementia with delirium. Failure to thrive. Frequent falls. Bilateral rib fractures from the fall. Severe cervical spine stenosis. Patient has advanced dementia, condition has been deteriorating significantly.  Patient eats very little, very confused.  She has been followed by hospice as outpatient, per husband request, had to transfer to comfort care. Continue current treatment.  Discussed with TOC, may consider transfer to inpatient hospice if accepted   Chronic kidney disease stage IIIb. Hypernatremia. Severe metabolic acidosis. Hypoglycemia  Conditions are caused by dehydration from no p.o. intake.   Paroxysmal atrial fibrillation. Acute on chronic diastolic congestive heart failure. Cardiogram showed ejection fraction 60 to 65% with grade 1 diastolic dysfunction.  No additional treatment.        Subjective:  Patient is confused, with occasional agitation.  Physical Exam: Vitals:   10/21/24 2127 10/22/24 0056 10/22/24 0415 10/22/24 0917  BP:  117/60 124/71 91/62  Pulse:  98 93 68   Resp:  16 16 16   Temp: (!) 97.5 F (36.4 C) 97.6 F (36.4 C) (!) 97.4 F (36.3 C) 97.6 F (36.4 C)  TempSrc: Axillary Oral Oral Oral  SpO2:  99% 100% 96%  Weight:      Height:       General exam: Ill-appearing. Respiratory system: Clear to auscultation. Respiratory effort normal. Cardiovascular system: S1 & S2 heard, RRR. No JVD, murmurs, rubs, gallops or clicks. No pedal edema. Gastrointestinal system: Abdomen is nondistended, soft and nontender. No organomegaly or masses felt. Normal bowel sounds heard. Central nervous system: Alert and oriented x1. No focal neurological deficits. Extremities: Symmetric 5 x 5 power. Skin: No rashes, lesions or ulcers Psychiatry: Flat affect   Data Reviewed:  There are no new results to review at this time.  Family Communication: Daughter updated over the phone.  Disposition: Status is: Inpatient Remains inpatient appropriate because: Comfort care.     Time spent: 35 minutes  Author: Murvin Mana, MD 10/22/2024 3:03 PM  For on call review [email or web address]    "

## 2024-10-22 NOTE — Plan of Care (Signed)
 Pt alert and very confused, screaming. Vitals stable, no significant changes through the night. Problem: Clinical Measurements: Goal: Quality of life will improve Outcome: Progressing   Problem: Respiratory: Goal: Verbalizations of increased ease of respirations will increase Outcome: Progressing   Problem: Pain Management: Goal: Satisfaction with pain management regimen will improve Outcome: Progressing   Problem: Elimination: Goal: Will not experience complications related to bowel motility Outcome: Progressing   Problem: Coping: Goal: Level of anxiety will decrease Outcome: Progressing

## 2024-10-22 NOTE — Progress Notes (Signed)
 [facility] Cardiology    SUBJECTIVE: Awake alert denies any pain no shortness of breath no palpitations or tachycardia slightly confused poor memory   Vitals:   10/21/24 2127 10/22/24 0056 10/22/24 0415 10/22/24 0917  BP:  117/60 124/71 91/62  Pulse:  98 93 68  Resp:  16 16 16   Temp: (!) 97.5 F (36.4 C) 97.6 F (36.4 C) (!) 97.4 F (36.3 C) 97.6 F (36.4 C)  TempSrc: Axillary Oral Oral Oral  SpO2:  99% 100% 96%  Weight:      Height:        No intake or output data in the 24 hours ending 10/22/24 1451    PHYSICAL EXAM  General: Well developed, well nourished, in no acute distress HEENT:  Normocephalic and atramatic Neck:  No JVD.  Lungs: Clear bilaterally to auscultation and percussion. Heart: HRRR . Normal S1 and S2 without gallops or murmurs.  Abdomen: Bowel sounds are positive, abdomen soft and non-tender  Msk:  Back normal, normal gait. Normal strength and tone for age. Extremities: No clubbing, cyanosis or edema.   Neuro: Alert and oriented X 3. Psych:  Good affect, responds appropriately   LABS: Basic Metabolic Panel: Recent Labs    10/20/24 1012 10/21/24 0555  NA 145 147*  K 4.6 3.7  CL 113* 119*  CO2 19* 14*  GLUCOSE 83 42*  BUN 32* 24*  CREATININE 1.84* 1.43*  CALCIUM  9.5 7.9*   Liver Function Tests: Recent Labs    10/20/24 1012  AST 27  ALT 25  ALKPHOS 100  BILITOT 0.2  PROT 5.6*  ALBUMIN 2.9*   No results for input(s): LIPASE, AMYLASE in the last 72 hours. CBC: Recent Labs    10/20/24 1012  WBC 4.6  NEUTROABS 3.2  HGB 9.1*  HCT 29.0*  MCV 97.3  PLT 272   Cardiac Enzymes: Recent Labs    10/20/24 1012  CKTOTAL 132   BNP: Invalid input(s): POCBNP D-Dimer: No results for input(s): DDIMER in the last 72 hours. Hemoglobin A1C: No results for input(s): HGBA1C in the last 72 hours. Fasting Lipid Panel: No results for input(s): CHOL, HDL, LDLCALC, TRIG, CHOLHDL, LDLDIRECT in the last 72 hours. Thyroid   Function Tests: Recent Labs    10/20/24 1030  TSH 4.460   Anemia Panel: No results for input(s): VITAMINB12, FOLATE, FERRITIN, TIBC, IRON, RETICCTPCT in the last 72 hours.  MR CERVICAL SPINE WO CONTRAST Result Date: 10/20/2024 EXAM: MRI CERVICAL SPINE WITHOUT CONTRAST 10/20/2024 03:54:58 PM TECHNIQUE: Multiplanar multisequence MRI of the cervical spine was performed. COMPARISON: CT cervical spine 10/20/2024. CLINICAL HISTORY: Spine fracture, cervical, traumatic. Fall. FINDINGS: LIMITATIONS/ARTIFACTS: The examination is motion degraded, including moderate to severe motion on the sagittal STIR and both axial sequences. BONES AND ALIGNMENT: Exaggerated upper thoracic kyphosis and lower cervical lordosis. T3 superior endplate compression fracture with 25% vertebral body height loss and moderate marrow edema. No gross fracture or marrow edema in the cervical spine with assessment limited by motion artifact. SPINAL CORD: Assessment of the spinal cord is limited by motion, however no cord edema is evident on the sagittal T2 sequence which is only mildly degraded. No visible epidural hematoma. SOFT TISSUES: Mild prevertebral edema greatest from C1 to C3. No discrete anterior longitudinal ligament disruption on this motion integrated examination. Moderate marrow edema throughout the right greater than left posterior paraspinal soft tissues. DISC LEVELS: Detailed assessment of degenerative changes is limited by motion. Disc bulging and infolding of the ligamentum flavum result in mild spinal stenosis  at C3-C4, moderate spinal stenosis at C4-C5, and severe spinal stenosis at C5-C6 and C6-C7 with at least mild spinal cord mass effect at C5-C6 and C6-C7. IMPRESSION: 1. Motion degraded examination. 2. Mild prevertebral edema and moderate posterior paraspinal soft tissue edema. No discrete ligamentous disruption or epidural tumor identified within study limitations. 3. Acute T3 compression fracture with 25%  vertebral body height loss. 4. Suspected severe spinal stenosis at C5-C6 and C6-C7 and moderate spinal stenosis at C4-C5. Electronically signed by: Dasie Hamburg MD MD 10/20/2024 04:33 PM EST RP Workstation: [id number]   MR LUMBAR SPINE WO CONTRAST Result Date: 10/20/2024 EXAM: MRI LUMBAR SPINE 10/20/2024 03:54:58 PM TECHNIQUE: Multiplanar multisequence MRI of the lumbar spine was performed without the administration of intravenous contrast. COMPARISON: CT chest, abdomen, and pelvis 10/20/2024. CLINICAL HISTORY: Low back pain, increased fracture risk. Fall. Left hip pain. FINDINGS: BONES AND ALIGNMENT: 5 lumbar type of vertebrae. Mild lumbar dextroscoliosis. Trace anterolisthesis of L3, L4, and L5 on S1. Partially visualized marrow edema in the sacral ala bilaterally and extending transversely through the anterior S3 segment consistent with acute fractures. Normal vertebral body heights. SPINAL CORD: The conus medullaris terminates at L1-L2 and is normal in signal. 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 Mild presacral soft tissue swelling/fluid. The abdominal and pelvic viscera were more fully evaluated on today's CT. DISC LEVELS: Disc desiccation throughout the lumbar spine. Severe disc space narrowing at L5-S1. T12-L1: Small central disc protrusion without stenosis. L1-L2: Shallow central disc protrusion with annular fissure and mild facet and ligamentum flavum hypertrophy without stenosis. L2-L3: Mild disc bulging and mild facet hypertrophy and mild stenosis. L3-L4: Anterolisthesis with bulging of uncovered disc and moderate facet hypertrophy without stenosis. Right foraminal to extraforaminal annular fissure. L4-L5: Mild disc bulging and moderate facet hypertrophy without stenosis. Small bilateral foraminal annular fissures. L5-S1: Anterolisthesis with bulging of uncovered disc and severe facet hypertrophy result in mild bilateral neural foraminal stenosis without spinal stenosis. IMPRESSION: 1. Acute  bilateral sacral fractures. 2. Multilevel lumbar disc and facet degeneration without high-grade stenosis. Electronically signed by: Dasie Hamburg MD MD 10/20/2024 04:20 PM EST RP Workstation: [id number]     Echo   TELEMETRY: :  ASSESSMENT AND PLAN:  Principal Problem:   Fall Active Problems:   Dementia with behavioral disturbance (HCC)   CKD stage 3b, GFR 30-44 ml/min (HCC)   Fall at home, initial encounter   Hypernatremia   Cervical spinal stenosis   Hypoglycemia   Failure to thrive in adult   Plan Dementia moderately severe limits management recommend palliative care for goals of care discussion Paroxysmal atrial fibrillation would recommend holding Eliquis  because of frequent falls consider reducing metoprolol  because of relative bradycardia Recommend discontinuing Crestor  for hyperlipidemia patient unlikely to benefit from continued therapy Maintain adequate hydration for renal insufficiency Continue to treat and avoid hyperthermia No clear evidence of significant pericardial effusion recommend conservative management No further cardiac or diagnostic workup at this stage Consider continue hospice and comfort measures Frequent falls poor anticoagulation candidate with Eliquis  recommend conservative therapy   Cara JONETTA Lovelace, MD,  10/22/2024 2:51 PM

## 2024-10-23 DIAGNOSIS — R627 Adult failure to thrive: Secondary | ICD-10-CM | POA: Diagnosis not present

## 2024-10-23 DIAGNOSIS — F03918 Unspecified dementia, unspecified severity, with other behavioral disturbance: Secondary | ICD-10-CM | POA: Diagnosis not present

## 2024-10-23 DIAGNOSIS — M4802 Spinal stenosis, cervical region: Secondary | ICD-10-CM | POA: Diagnosis not present

## 2024-10-23 LAB — COXSACKIE A VIRUS ANTIBODIES
Coxsackie A16 IgG: NEGATIVE {titer}
Coxsackie A16 IgM: NEGATIVE {titer}
Coxsackie A24 IgG: NEGATIVE {titer}
Coxsackie A24 IgM: NEGATIVE {titer}
Coxsackie A7 IgG: NEGATIVE {titer}
Coxsackie A7 IgM: NEGATIVE {titer}
Coxsackie A9 IgG: 1:100 {titer} — ABNORMAL HIGH
Coxsackie A9 IgM: NEGATIVE {titer}

## 2024-10-23 MED ORDER — HALOPERIDOL 2 MG PO TABS
2.0000 mg | ORAL_TABLET | Freq: Four times a day (QID) | ORAL | Status: DC | PRN
  Administered 2024-10-23 – 2024-10-30 (×3): 2 mg via ORAL
  Filled 2024-10-23 (×6): qty 1

## 2024-10-23 MED ORDER — HALOPERIDOL 0.5 MG PO TABS: 2.0000 mg | ORAL_TABLET | Freq: Four times a day (QID) | ORAL | Status: DC | PRN

## 2024-10-23 MED ORDER — HALOPERIDOL LACTATE 5 MG/ML IJ SOLN: 2.0000 mg | Freq: Four times a day (QID) | INTRAMUSCULAR | Status: DC | PRN

## 2024-10-23 MED ORDER — HALOPERIDOL LACTATE 5 MG/ML IJ SOLN
2.0000 mg | Freq: Four times a day (QID) | INTRAMUSCULAR | Status: DC | PRN
  Administered 2024-10-31 – 2024-11-01 (×3): 2 mg via INTRAMUSCULAR
  Filled 2024-10-23 (×3): qty 1

## 2024-10-23 NOTE — Progress Notes (Signed)
 Brief cardiology note not for billing purposes:  Patient transitioned to comfort care. Cardiology will sign off but please let us  know if there are any questions.   SignedBETHA Danita Bloch, PA-C 10/23/2024 7:06 AM

## 2024-10-23 NOTE — TOC Progression Note (Signed)
 Transition of Care [facility]) - Progression Note    Patient Details  Name: Laura Bishop MRN: [id number] Date of Birth: 1941-03-15  [facility]) CM/SW Contact  Dalia GORMAN Fuse, RN Phone Number: 10/23/2024, 11:12 AM  Clinical Narrative:     Bed offer from [facility] in [place]. [facility], [facility], [facility], [facility], and [facility] declined. TOC will share bed offers.   Plan to dc to SNF once bed accepted and ins auth obtained.                     Expected Discharge Plan and Services                                               Social Drivers of Health (SDOH) Interventions SDOH Screenings   Food Insecurity: No Food Insecurity (10/16/2024)   Received from [facility]  Housing: Low Risk  (10/16/2024)   Received from [facility]  Transportation Needs: No Transportation Needs (10/16/2024)   Received from [facility]  Utilities: Not At Risk (10/16/2024)   Received from [facility]  Financial Resource Strain: Low Risk  (10/16/2024)   Received from [facility]  Social Connections: Patient Unable To Answer (09/30/2024)  Tobacco Use: Low Risk (10/20/2024)  Recent Concern: Tobacco Use - Medium Risk (10/18/2024)   Received from [facility]    Readmission Risk Interventions     No data to display

## 2024-10-23 NOTE — Progress Notes (Signed)
" °  Progress Note   Patient: Laura Bishop FMW:[id number] DOB: November 18, 1940 DOA: 10/20/2024     2 DOS: the patient was seen and examined on 10/23/2024   Brief hospital course: Laura Bishop is a 84 y.o. female with medical history significant of advanced dementia, HTN, CKD stage IIIb, hypothyroidism, chronic ambulation impairment, PAF on Eliquis , stroke, brought in by family member for evaluation of frequent falls, new onset of back pain.  Patient has advanced dementia, has been eating very little.  Patient currently followed by hospice as outpatient, per husband's request, will transition to comfort care.   Principal Problem:   Fall Active Problems:   Dementia with behavioral disturbance (HCC)   CKD stage 3b, GFR 30-44 ml/min (HCC)   Fall at home, initial encounter   Hypernatremia   Cervical spinal stenosis   Hypoglycemia   Failure to thrive in adult   Assessment and Plan: Advanced dementia with delirium. Failure to thrive. Frequent falls. Bilateral rib fractures from the fall. Severe cervical spine stenosis. Patient has advanced dementia, condition has been deteriorating significantly.  Patient eats very little, very confused.  She has been followed by hospice as outpatient, per husband request, had to transfer to comfort care. Patient still has some p.o. intake, TOC is working on SNF placement with hospice.   Chronic kidney disease stage IIIb. Hypernatremia. Severe metabolic acidosis. Hypoglycemia  Conditions are caused by dehydration from no p.o. intake.   Paroxysmal atrial fibrillation. Acute on chronic diastolic congestive heart failure. Cardiogram showed ejection fraction 60 to 65% with grade 1 diastolic dysfunction.  No additional treatment.      Subjective:  Patient is confused with occasional agitation.  Physical Exam: Vitals:   10/22/24 0415 10/22/24 0917 10/22/24 2020 10/23/24 0936  BP: 124/71 91/62 (!) 111/94 139/84  Pulse: 93 68 98 84  Resp: 16 16 18  19   Temp: (!) 97.4 F (36.3 C) 97.6 F (36.4 C) (!) 97.5 F (36.4 C) 97.6 F (36.4 C)  TempSrc: Oral Oral Oral Oral  SpO2: 100% 96% 95% 97%  Weight:      Height:       General exam: Appears calm and comfortable  Respiratory system: Clear to auscultation. Respiratory effort normal. Cardiovascular system: S1 & S2 heard, RRR. No JVD, murmurs, rubs, gallops or clicks. No pedal edema. Gastrointestinal system: Abdomen is nondistended, soft and nontender. No organomegaly or masses felt. Normal bowel sounds heard. Central nervous system: Alert and confused. Extremities: Symmetric 5 x 5 power. Skin: No rashes, lesions or ulcers Psychiatry: Flat Affect   Data Reviewed:  There are no new results to review at this time.  Family Communication: None  Disposition: Status is: Inpatient Remains inpatient appropriate because: Comfort care.     Time spent: 35 minutes  Author: Murvin Mana, MD 10/23/2024 11:57 AM  For on call review [email or web address]    "

## 2024-10-24 DIAGNOSIS — E870 Hyperosmolality and hypernatremia: Secondary | ICD-10-CM | POA: Diagnosis not present

## 2024-10-24 DIAGNOSIS — R627 Adult failure to thrive: Secondary | ICD-10-CM | POA: Diagnosis not present

## 2024-10-24 DIAGNOSIS — F03918 Unspecified dementia, unspecified severity, with other behavioral disturbance: Secondary | ICD-10-CM | POA: Diagnosis not present

## 2024-10-24 NOTE — Plan of Care (Signed)

## 2024-10-24 NOTE — Progress Notes (Signed)
" °  Progress Note   Patient: Laura Bishop FMW:[id number] DOB: 1940-11-26 DOA: 10/20/2024     3 DOS: the patient was seen and examined on 10/24/2024   Brief hospital course: Laura Bishop is a 84 y.o. female with medical history significant of advanced dementia, HTN, CKD stage IIIb, hypothyroidism, chronic ambulation impairment, PAF on Eliquis , stroke, brought in by family member for evaluation of frequent falls, new onset of back pain.  Patient has advanced dementia, has been eating very little.  Patient currently followed by hospice as outpatient, per husband's request, will transition to comfort care.   Principal Problem:   Fall Active Problems:   Dementia with behavioral disturbance (HCC)   CKD stage 3b, GFR 30-44 ml/min (HCC)   Fall at home, initial encounter   Hypernatremia   Cervical spinal stenosis   Hypoglycemia   Failure to thrive in adult   Assessment and Plan: Advanced dementia with delirium. Failure to thrive. Frequent falls. Bilateral rib fractures from the fall. Severe cervical spine stenosis. Patient has advanced dementia, condition has been deteriorating significantly.  Patient eats very little, very confused.  She has been followed by hospice as outpatient, per husband request,  transitioned to comfort care. She is still has occasional agitation requiring multiple doses of Haldol  IM, she has minimal intake.  Her prognosis is probably 1 to 2 weeks, TOC is working on nursing home with hospice.   Chronic kidney disease stage IIIb. Hypernatremia. Severe metabolic acidosis. Hypoglycemia  Conditions are caused by dehydration from no p.o. intake.   Paroxysmal atrial fibrillation. Acute on chronic diastolic congestive heart failure. Cardiogram showed ejection fraction 60 to 65% with grade 1 diastolic dysfunction.  No additional treatment.         Subjective:  Patient has minimal intake, she is confused and agitated occasionally.  Physical Exam: Vitals:    10/23/24 0936 10/23/24 2021 10/23/24 2200 10/24/24 0744  BP: 139/84 111/86  129/87  Pulse: 84 (!) 43  83  Resp: 19 16  14   Temp: 97.6 F (36.4 C) (!) 97.5 F (36.4 C)  98.9 F (37.2 C)  TempSrc: Oral Oral  Oral  SpO2: 97% (!) 72% 90% 100%  Weight:      Height:       General exam: Appears calm and comfortable  Respiratory system: Clear to auscultation. Respiratory effort normal. Cardiovascular system: S1 & S2 heard, RRR. No JVD, murmurs, rubs, gallops or clicks. No pedal edema. Gastrointestinal system: Abdomen is nondistended, soft and nontender. No organomegaly or masses felt. Normal bowel sounds heard. Central nervous system: Alert and oriented x1. No focal neurological deficits. Extremities: Symmetric 5 x 5 power. Skin: No rashes, lesions or ulcers Psychiatry: Flat affect with occasional agitation  Data Reviewed:  There are no new results to review at this time.  Family Communication: None  Disposition: Status is: Inpatient Remains inpatient appropriate because: Comfort care.     Time spent: 35 minutes  Author: Murvin Mana, MD 10/24/2024 2:20 PM  For on call review [email or web address]    "

## 2024-10-24 NOTE — TOC Progression Note (Addendum)
 Transition of Care [facility]) - Progression Note    Patient Details  Name: Laura Bishop MRN: [id number] Date of Birth: 04/13/1941  Transition of Care [facility]) CM/SW Contact  Dalia GORMAN Fuse, RN Phone Number: 10/24/2024, 11:14 AM  Clinical Narrative:     Patient is comfort care, hospice is following. Patient with one bed offer from [place] [facility]. TOC lvmm for [place] to see if they can accept the patient. They will not have a bed until Thur.  TOC visited the patient's room. Her sister and brother in-law were leaving out and advised her daughter should be contacted for decision making.   TOC placed a call to the patients daughter [name] and lvmm requesting call back.  TOC will continue to follow.                     Expected Discharge Plan and Services                                               Social Drivers of Health (SDOH) Interventions SDOH Screenings   Food Insecurity: Patient Unable To Answer (10/24/2024)  Housing: Patient Unable To Answer (10/24/2024)  Transportation Needs: Patient Unable To Answer (10/24/2024)  Utilities: Patient Unable To Answer (10/24/2024)  Financial Resource Strain: Low Risk  (10/16/2024)   Received from [facility]  Social Connections: Patient Unable To Answer (10/24/2024)  Tobacco Use: Low Risk (10/20/2024)  Recent Concern: Tobacco Use - Medium Risk (10/18/2024)   Received from [facility]    Readmission Risk Interventions     No data to display

## 2024-10-24 NOTE — Progress Notes (Signed)
 [facility] Liaison Note   Bedside visit made to reassess for [facility] appropriateness.  Patient is alone at the time of visit.  Patient is resting comfortably in bed and is pleasantly confused.  Patient continues to not meet IPU criteria at this time.  Hospital liaison team will continue to follow.   Please reach out with any hospice related needs or questions.  Thank you, Daphne Shed, LPN [phone]

## 2024-10-24 NOTE — Progress Notes (Signed)
 Family at bedside.   Patient stated in pain with a head nod and I noticed pt was moving around more in bed.   Talked to daughter on the phone earlier in the am and she stated that is a sign of discomfort.   I pulled morphine  orally from [facility]. Educated and explain to patient what I had for her and patient then stated she was not in pain and did not want meds or orange juice.   Comforted family and expressed that at this stage where it is really hard to communicate it is hard for us  to know what she if feeling/ thinking but we will try our best to comfort her during this time.

## 2024-10-24 NOTE — Plan of Care (Signed)
  Problem: Clinical Measurements: Goal: Ability to maintain clinical measurements within normal limits will improve Outcome: Progressing   Problem: Pain Managment: Goal: General experience of comfort will improve and/or be controlled Outcome: Progressing   Problem: Safety: Goal: Ability to remain free from injury will improve Outcome: Progressing   Problem: Skin Integrity: Goal: Risk for impaired skin integrity will decrease Outcome: Progressing

## 2024-10-25 DIAGNOSIS — W19XXXD Unspecified fall, subsequent encounter: Secondary | ICD-10-CM

## 2024-10-25 DIAGNOSIS — R627 Adult failure to thrive: Secondary | ICD-10-CM | POA: Diagnosis not present

## 2024-10-25 NOTE — Plan of Care (Signed)

## 2024-10-25 NOTE — Progress Notes (Addendum)
" °  Progress Note   Patient: Laura Bishop FMW:[id number] DOB: 1941-04-06 DOA: 10/20/2024     4 DOS: the patient was seen and examined on 10/25/2024   Brief hospital course:  Laura Bishop is a 84 y.o. female with medical history significant of advanced dementia, HTN, CKD stage IIIb, hypothyroidism, chronic ambulation impairment, PAF on Eliquis , stroke, brought in by family member for evaluation of frequent falls, new onset of back pain.  Patient has advanced dementia, has been eating very little.  Patient currently followed by hospice as outpatient, per husband's request, has been transitioned to comfort care.    Assessment and Plan:  Advanced dementia with delirium. Failure to thrive. Frequent falls. Bilateral rib fractures from the fall. Severe cervical spine stenosis. Patient has advanced dementia and her condition has been deteriorating significantly.  Oral intake remains poor She  has occasional agitation requiring multiple doses of Haldol  IM Currently on comfort measures Not a candidate for inpatient hospice at this time   Chronic kidney disease stage IIIb. Hypernatremia. Severe metabolic acidosis. Hypoglycemia  Related to poor oral intake    Paroxysmal atrial fibrillation. Acute on chronic diastolic congestive heart failure.        Subjective: Confused.  Patient's pastor and wife at the bedside  Physical Exam: Vitals:   10/23/24 2200 10/24/24 0744 10/24/24 2000 10/25/24 0819  BP:  129/87 132/72 (!) 104/56  Pulse:  83 99 (!) 110  Resp:  14 18 16   Temp:  98.9 F (37.2 C) 98.2 F (36.8 C) 99.1 F (37.3 C)  TempSrc:  Oral Axillary Axillary  SpO2: 90% 100% 96% 97%  Weight:      Height:       General exam: Appears calm and comfortable  Respiratory system: Clear to auscultation. Respiratory effort normal. Cardiovascular system: S1 & S2 heard, RRR. No JVD, murmurs, rubs, gallops or clicks. No pedal edema. Gastrointestinal system: Abdomen is nondistended,  soft and nontender. No organomegaly or masses felt. Normal bowel sounds heard. Central nervous system: Alert and oriented x1. No focal neurological deficits. Extremities: Able to move all extremities Skin: No rashes, lesions or ulcers Psychiatry: Flat affect with occasional agitation     Data Reviewed:  There are no new results to review at this time.  Family Communication: None  Disposition: Status is: Inpatient Remains inpatient appropriate because: Discharge planning  Planned Discharge Destination: TBD    Time spent: 30 minutes  Author: Aimee Somerset, MD 10/25/2024 12:47 PM  For on call review [email or web address]  "

## 2024-10-25 NOTE — TOC Progression Note (Signed)
 Transition of Care [facility]) - Progression Note    Patient Details  Name: Laura Bishop MRN: [id number] Date of Birth: 17-Jun-1941  [facility] CM/SW Contact  Dalia GORMAN Fuse, RN Phone Number: 10/25/2024, 1:21 PM  Clinical Narrative:    TOC spoke with the patient's daughter [name], the patient's husband is also in the ED waiting to be admitted. TOC advised that [facility] has a LTC bed. The family will need to work with [facility] to get the patient admitted. TOC explained that Medicare doesn't typically cover LTC stays. [name] asked about [facility] IPU, the patient doesn't qualify for IPU at this time. TOC explained that [facility] will follow the patient and reval if there is a change.  [name] had questions about whether or not her mother has LTC insurance, TOC explained that we have no way of knowing that info. [name] will speak with her father about that. TOC sent referral to [facility] with finance to screen to see if the patient meets criteria for Medicaid LTC.                      Expected Discharge Plan and Services                                               Social Drivers of Health (SDOH) Interventions SDOH Screenings   Food Insecurity: Patient Unable To Answer (10/24/2024)  Housing: Patient Unable To Answer (10/24/2024)  Transportation Needs: Patient Unable To Answer (10/24/2024)  Utilities: Patient Unable To Answer (10/24/2024)  Financial Resource Strain: Low Risk  (10/16/2024)   Received from [facility]  Social Connections: Patient Unable To Answer (10/24/2024)  Tobacco Use: Low Risk (10/20/2024)  Recent Concern: Tobacco Use - Medium Risk (10/18/2024)   Received from [facility]    Readmission Risk Interventions     No data to display

## 2024-10-26 DIAGNOSIS — W19XXXD Unspecified fall, subsequent encounter: Secondary | ICD-10-CM | POA: Diagnosis not present

## 2024-10-26 DIAGNOSIS — R627 Adult failure to thrive: Secondary | ICD-10-CM | POA: Diagnosis not present

## 2024-10-26 NOTE — Progress Notes (Signed)
" °  Progress Note   Patient: Laura Bishop FMW:[id number] DOB: Dec 04, 1940 DOA: 10/20/2024     5 DOS: the patient was seen and examined on 10/26/2024   Brief hospital course:  Laura Bishop is a 84 y.o. female with medical history significant of advanced dementia, HTN, CKD stage IIIb, hypothyroidism, chronic ambulation impairment, PAF on Eliquis , stroke, brought in by family member for evaluation of frequent falls, new onset of back pain.  Patient has advanced dementia, has been eating very little.  Patient currently followed by hospice as outpatient, per husband's request, has been transitioned to comfort care.    Assessment and Plan:  Advanced dementia with delirium. Failure to thrive. Frequent falls. Bilateral rib fractures from the fall. Severe cervical spine stenosis. Patient has advanced dementia and her condition has been deteriorating significantly.  Oral intake remains poor She  has occasional agitation requiring multiple doses of Haldol  IM Currently on comfort measures Not a candidate for inpatient hospice at this time Possible discharge to skilled nursing facility with comfort measures      Chronic kidney disease stage IIIb. Hypernatremia. Severe metabolic acidosis. Hypoglycemia  Related to poor oral intake     Paroxysmal atrial fibrillation. Acute on chronic diastolic congestive heart failure.          Subjective: No new complaints  Physical Exam: Vitals:   10/24/24 0744 10/24/24 2000 10/25/24 0819 10/25/24 1944  BP: 129/87 132/72 (!) 104/56 108/77  Pulse: 83 99 (!) 110 75  Resp: 14 18 16 19   Temp: 98.9 F (37.2 C) 98.2 F (36.8 C) 99.1 F (37.3 C) 97.7 F (36.5 C)  TempSrc: Oral Axillary Axillary Oral  SpO2: 100% 96% 97% 95%  Weight:      Height:       General exam: Appears calm and comfortable  Respiratory system: Clear to auscultation. Respiratory effort normal. Cardiovascular system: S1 & S2 heard, RRR. No JVD, murmurs, rubs, gallops or  clicks. No pedal edema. Gastrointestinal system: Abdomen is nondistended, soft and nontender. No organomegaly or masses felt. Normal bowel sounds heard. Central nervous system: Alert and oriented x1. No focal neurological deficits. Extremities: Able to move all extremities Skin: No rashes, lesions or ulcers Psychiatry: Flat affect with occasional agitation   Data Reviewed:  There are no new results to review at this time.  Family Communication: Discussed patient's condition and plan of care with her daughter [name] Munroe over the phone.  All questions and concerns have been addressed.  She verbalizes understanding and agrees with the plan  Disposition: Status is: Inpatient Remains inpatient appropriate because: Discharge planning  Planned Discharge Destination: TBD    Time spent: 35 minutes  Author: Aimee Somerset, MD 10/26/2024 11:54 AM  For on call review [email or web address]  "

## 2024-10-26 NOTE — TOC Progression Note (Signed)
 Transition of Care [facility]) - Progression Note    Patient Details  Name: CALEB PRIGMORE MRN: [id number] Date of Birth: 1941-07-28  Transition [facility]) CM/SW Contact  Nathanael CHRISTELLA Ring, RN Phone Number: 10/26/2024, 4:12 PM  Clinical Narrative:     Florence and left [name] a message.  We need to discuss doing short term rehab and transition to LTC.  [facility] is still reviewing the referral.  Montie at [facility] says that she will have the DON look at it in the morning.                     Expected Discharge Plan and Services                                               Social Drivers of Health (SDOH) Interventions SDOH Screenings   Food Insecurity: Patient Unable To Answer (10/24/2024)  Housing: Patient Unable To Answer (10/24/2024)  Transportation Needs: Patient Unable To Answer (10/24/2024)  Utilities: Patient Unable To Answer (10/24/2024)  Financial Resource Strain: Low Risk  (10/16/2024)   Received from [facility]  Social Connections: Patient Unable To Answer (10/24/2024)  Tobacco Use: Low Risk (10/20/2024)  Recent Concern: Tobacco Use - Medium Risk (10/18/2024)   Received from [facility]    Readmission Risk Interventions     No data to display

## 2024-10-26 NOTE — TOC Progression Note (Signed)
 Transition of Care [facility]) - Progression Note    Patient Details  Name: Laura Bishop MRN: [id number] Date of Birth: 1941-01-10  [facility]) CM/SW Contact  Nathanael CHRISTELLA Ring, RN Phone Number: 10/26/2024, 4:17 PM  Clinical Narrative:     CM spoke with daughter, she is okay with discontinuing comfort care.  Plan will be to pursue short term rehab and transition to long term care.  Daughter reports that her dad is also in the hospital and cannot care for the patient at home.                      Expected Discharge Plan and Services                                               Social Drivers of Health (SDOH) Interventions SDOH Screenings   Food Insecurity: Patient Unable To Answer (10/24/2024)  Housing: Patient Unable To Answer (10/24/2024)  Transportation Needs: Patient Unable To Answer (10/24/2024)  Utilities: Patient Unable To Answer (10/24/2024)  Financial Resource Strain: Low Risk  (10/16/2024)   Received from [facility]  Social Connections: Patient Unable To Answer (10/24/2024)  Tobacco Use: Low Risk (10/20/2024)  Recent Concern: Tobacco Use - Medium Risk (10/18/2024)   Received from [facility]    Readmission Risk Interventions     No data to display

## 2024-10-26 NOTE — Plan of Care (Signed)
 Patient restless but more comfortable. Patient's comfort includes soft music, liquid, ice cream, therapeutic conversation, therapeutic touch, and morphine .  Problem: Education: Goal: Knowledge of the prescribed therapeutic regimen will improve Outcome: Progressing   Problem: Coping: Goal: Ability to identify and develop effective coping behavior will improve Outcome: Progressing   Problem: Clinical Measurements: Goal: Quality of life will improve Outcome: Progressing   Problem: Respiratory: Goal: Verbalizations of increased ease of respirations will increase Outcome: Progressing   Problem: Role Relationship: Goal: Family's ability to cope with current situation will improve Outcome: Progressing Goal: Ability to verbalize concerns, feelings, and thoughts to partner or family member will improve Outcome: Progressing   Problem: Pain Management: Goal: Satisfaction with pain management regimen will improve Outcome: Progressing   Problem: Education: Goal: Knowledge of General Education information will improve Description: Including pain rating scale, medication(s)/side effects and non-pharmacologic comfort measures Outcome: Progressing   Problem: Health Behavior/Discharge Planning: Goal: Ability to manage health-related needs will improve Outcome: Progressing   Problem: Clinical Measurements: Goal: Ability to maintain clinical measurements within normal limits will improve Outcome: Progressing Goal: Will remain free from infection Outcome: Progressing Goal: Diagnostic test results will improve Outcome: Progressing Goal: Respiratory complications will improve Outcome: Progressing Goal: Cardiovascular complication will be avoided Outcome: Progressing   Problem: Activity: Goal: Risk for activity intolerance will decrease Outcome: Progressing   Problem: Nutrition: Goal: Adequate nutrition will be maintained Outcome: Progressing   Problem: Coping: Goal: Level of anxiety  will decrease Outcome: Progressing   Problem: Elimination: Goal: Will not experience complications related to bowel motility Outcome: Progressing Goal: Will not experience complications related to urinary retention Outcome: Progressing   Problem: Pain Managment: Goal: General experience of comfort will improve and/or be controlled Outcome: Progressing   Problem: Safety: Goal: Ability to remain free from injury will improve Outcome: Progressing   Problem: Skin Integrity: Goal: Risk for impaired skin integrity will decrease Outcome: Progressing

## 2024-10-26 NOTE — Plan of Care (Signed)

## 2024-10-26 NOTE — TOC Progression Note (Signed)
 Transition of Care [facility]) - Progression Note    Patient Details  Name: Laura Bishop MRN: [id number] Date of Birth: 1941/01/12  Transition [facility] Care [facility]) CM/SW Contact  Nathanael CHRISTELLA Ring, RN Phone Number: 10/26/2024, 12:23 PM  Clinical Narrative:    CM reached out to patient's daughter [name] [name] asks if there is another facility that her mom can go to, I ask if she has anywhere in particular in mind and she says [facility].  I reached out to Bethania at [facility] she will review referral.  She reports that they are in network with some [employer] plans and that they have several long term care beds available at least 10.  She will get back with me by the end of the day.                      Expected Discharge Plan and Services                                               Social Drivers of Health (SDOH) Interventions SDOH Screenings   Food Insecurity: Patient Unable To Answer (10/24/2024)  Housing: Patient Unable To Answer (10/24/2024)  Transportation Needs: Patient Unable To Answer (10/24/2024)  Utilities: Patient Unable To Answer (10/24/2024)  Financial Resource Strain: Low Risk  (10/16/2024)   Received [facility]  Social Connections: Patient Unable To Answer (10/24/2024)  Tobacco Use: Low Risk (10/20/2024)  Recent Concern: Tobacco Use - Medium Risk (10/18/2024)   Received from [facility]    Readmission Risk Interventions     No data to display

## 2024-10-27 DIAGNOSIS — R627 Adult failure to thrive: Secondary | ICD-10-CM | POA: Diagnosis not present

## 2024-10-27 DIAGNOSIS — W19XXXD Unspecified fall, subsequent encounter: Secondary | ICD-10-CM | POA: Diagnosis not present

## 2024-10-27 NOTE — Evaluation (Signed)
 Occupational Therapy Re-Evaluation Patient Details Name: Laura Bishop MRN: [id number] DOB: 11-Aug-1941 Today's Date: 10/27/2024   History of Present Illness   Laura Bishop is a 84 y.o. female with medical history significant of advanced dementia, HTN, CKD stage IIIb, hypothyroidism, chronic ambulation impairment, PAF on Eliquis , stroke, brought in by family member for evaluation of frequent falls, new onset of back pain.    Clinical Impressions Pt was seen for OT re-evaluation this date. Prior to hospital admission, pt was requiring assist with limited mobility, assist for bathing, dressing, and could self feed. Pt lives with her spouse who provides assist. Pt presents with deficits in cognition, strength, balance, and activity tolerance limiting her ability to perform ADL management at baseline level. Pt currently requires MAX A for all aspects of bathing, dressing, and toileting as well as grooming and self feeding. Pt alert and oriented to self and hospital only. Able to follow ~25% of simple commands. Pt required MIN A +2 for STS with RW. Pt would benefit from skilled OT services to address noted impairments and functional limitations (see below for any additional details) in order to maximize safety and independence while minimizing future risk of falls, injury, and readmission. Anticipate the need for follow up OT services upon acute hospital DC. OT goals reviewed and remain appropriate.     If plan is discharge home, recommend the following:   A lot of help with bathing/dressing/bathroom;Assistance with cooking/housework;Assist for transportation;Help with stairs or ramp for entrance;Direct supervision/assist for medications management;Direct supervision/assist for financial management;Two people to help with walking and/or transfers;Supervision due to cognitive status     Functional Status Assessment   Patient has had a recent decline in their functional status and demonstrates  the ability to make significant improvements in function in a reasonable and predictable amount of time.     Equipment Recommendations   Other (comment) (defer)     Recommendations for Other Services         Precautions/Restrictions   Precautions Precautions: Fall Recall of Precautions/Restrictions: Impaired Precaution/Restrictions Comments: hx of progressing dementia Restrictions Weight Bearing Restrictions Per Provider Order: No     Mobility Bed Mobility Overal bed mobility: Needs Assistance Bed Mobility: Supine to Sit, Sit to Supine     Supine to sit: Total assist, +2 for physical assistance Sit to supine: Mod assist, +2 for physical assistance   General bed mobility comments: requires mod assist to bring legs back up onto bed    Transfers Overall transfer level: Needs assistance Equipment used: Rolling walker (2 wheels) Transfers: Sit to/from Stand Sit to Stand: Min assist, +2 physical assistance           General transfer comment: patient able to stand briefly, then returns self to sitting. Declined attempts to stand again, trying to lie back down.      Balance Overall balance assessment: Needs assistance Sitting-balance support: Feet supported Sitting balance-Leahy Scale: Fair     Standing balance support: Bilateral upper extremity supported, During functional activity, Reliant on assistive device for balance Standing balance-Leahy Scale: Poor                             ADL either performed or assessed with clinical judgement   ADL Overall ADL's : Needs assistance/impaired            General ADL Comments: Pt currently requiring MAX A for all bed level bathing, dressing, toileting, grooming, and self feeding  Pertinent Vitals/Pain Pain Assessment Pain Assessment: PAINAD Breathing: normal Negative Vocalization: occasional moan/groan, low speech, negative/disapproving quality Facial Expression: smiling or  inexpressive Body Language: relaxed Consolability: no need to console PAINAD Score: 1 Pain Location: unclear Pain Descriptors / Indicators: Moaning Pain Intervention(s): Monitored during session, Repositioned     Extremity/Trunk Assessment Upper Extremity Assessment Upper Extremity Assessment: Generalized weakness   Lower Extremity Assessment Lower Extremity Assessment: Generalized weakness       Communication Communication Communication: Impaired Factors Affecting Communication: Reduced clarity of speech;Difficulty expressing self   Cognition Arousal: Alert Behavior During Therapy: Flat affect Cognition: History of cognitive impairments, Cognition impaired             OT - Cognition Comments: hx dementia, oriented to self and hospital, requires multimodal cues for simple commands, able to follow simple commands ~25% of the time                 Following commands: Impaired Following commands impaired: Follows one step commands inconsistently     Cueing  General Comments   Cueing Techniques: Verbal cues;Gestural cues;Visual cues;Tactile cues              Home Living Family/patient expects to be discharged to:: Private residence Living Arrangements: Spouse/significant other Available Help at Discharge: Family;Available 24 hours/day Type of Home: House Home Access: Stairs to enter [employer] of Steps: 2 Entrance Stairs-Rails: Right Home Layout: One level     Bathroom Shower/Tub: Walk-in shower;Tub/shower [employer] Accessibility: Yes       Additional Comments: used [facility] when had HH prior to admission      Prior Functioning/Environment Prior Level of Function : Independent/Modified Independent;History of Falls (last six months)             Mobility Comments: [facility] with HH; fell one week within leaving SNF ADLs Comments: per husband pt was dependent in bathing and dressing - he had to help with  toileting, and meals but she did not eat - no appetite    OT Problem List: Decreased activity tolerance;Decreased safety awareness;Decreased range of motion;Decreased cognition;Decreased strength;Decreased knowledge of use of DME or AE;Impaired balance (sitting and/or standing)   OT Treatment/Interventions: Self-care/ADL training;Therapeutic exercise;Neuromuscular education;Patient/family education;Balance training;Therapeutic activities;DME and/or AE instruction;Cognitive remediation/compensation      OT Goals(Current goals can be found in the care plan section)   Acute Rehab OT Goals Patient Stated Goal: pt does not state OT Goal Formulation: Patient unable to participate in goal setting Time For Goal Achievement: 11/10/24 Potential to Achieve Goals: Fair   OT Frequency:  Min 1X/week    Co-evaluation PT/OT/SLP Co-Evaluation/Treatment: Yes Reason for Co-Treatment: Necessary to address cognition/behavior during functional activity;For patient/therapist safety;To address functional/ADL transfers PT goals addressed during session: Mobility/safety with mobility;Balance;Proper use of DME OT goals addressed during session: ADL's and self-care;Proper use of Adaptive equipment and DME      AM-PAC OT 6 Clicks Daily Activity     Outcome Measure Help from another person eating meals?: A Lot Help from another person taking care of personal grooming?: A Lot Help from another person toileting, which includes using toliet, bedpan, or urinal?: Total Help from another person bathing (including washing, rinsing, drying)?: A Lot Help from another person to put on and taking off regular upper body clothing?: A Lot Help from another person to put on and taking off regular lower body clothing?: A Lot 6 Click Score: 11   End of Session Equipment Utilized During Treatment: Rolling  walker (2 wheels)  Activity Tolerance: Patient tolerated treatment well Patient left: in bed;with call bell/phone  within reach;with bed alarm set  OT Visit Diagnosis: History of falling (Z91.81);Muscle weakness (generalized) (M62.81);Cognitive communication deficit (R41.841)                Time: 9075-9065 OT Time Calculation (min): 10 min Charges:  OT General Charges $OT Visit: 1 Visit OT Evaluation $OT Re-eval: 1 Re-eval  Warren SAUNDERS., MPH, MS, OTR/L ascom [phone] 10/27/24, 11:04 AM

## 2024-10-27 NOTE — Plan of Care (Signed)
 Spoke to pt's daughter, [name] Munroe.  Pt's husband is at [facility] with [facility].  # to reach daughter is [phone].

## 2024-10-27 NOTE — Evaluation (Signed)
 Physical Therapy Evaluation Patient Details Name: Laura Bishop MRN: [id number] DOB: June 24, 1941 Today's Date: 10/27/2024  History of Present Illness  Laura Bishop is a 84 y.o. female with medical history significant of advanced dementia, HTN, CKD stage IIIb, hypothyroidism, chronic ambulation impairment, PAF on Eliquis , stroke, brought in by family member for evaluation of frequent falls, new onset of back pain.   Clinical Impression  Patient received in bed, she is alert, confused. Patient follows commands ~ 25% of the time. She requires total +2 assist for supine to sit. She is able to sit unassisted at edge of bed. Patient is able to stand with +2 min A and RW. Patient is unable to take any steps, returning self to sitting and trying to lie back down. Patient will continue to benefit from skilled PT to improve strength and functional independence.          If plan is discharge home, recommend the following: Help with stairs or ramp for entrance;Assist for transportation;Assistance with feeding;Assistance with cooking/housework;Supervision due to cognitive status;A lot of help with walking and/or transfers;A lot of help with bathing/dressing/bathroom   Can travel by private vehicle   No    Equipment Recommendations Other (comment) (TBD)  Recommendations for Other Services       Functional Status Assessment Patient has had a recent decline in their functional status and demonstrates the ability to make significant improvements in function in a reasonable and predictable amount of time.     Precautions / Restrictions Precautions Precautions: Fall Recall of Precautions/Restrictions: Impaired Precaution/Restrictions Comments: hx of progressing dementia Restrictions Weight Bearing Restrictions Per Provider Order: No      Mobility  Bed Mobility Overal bed mobility: Needs Assistance Bed Mobility: Supine to Sit, Sit to Supine     Supine to sit: Total assist, +2 for physical  assistance Sit to supine: Mod assist, +2 for physical assistance   General bed mobility comments: requires mod assist to bring legs back up onto bed    Transfers Overall transfer level: Needs assistance Equipment used: Rolling walker (2 wheels) Transfers: Sit to/from Stand Sit to Stand: Min assist, +2 physical assistance           General transfer comment: patient able to stand briefly, then returns self to sitting. Declined attempts to stand again, trying to lie back down.    Ambulation/Gait               General Gait Details: unable  Stairs            Wheelchair Mobility     Tilt Bed    Modified Rankin (Stroke Patients Only)       Balance Overall balance assessment: Needs assistance Sitting-balance support: Feet supported Sitting balance-Leahy Scale: Fair     Standing balance support: Bilateral upper extremity supported, During functional activity, Reliant on assistive device for balance Standing balance-Leahy Scale: Poor                               Pertinent Vitals/Pain Pain Assessment Pain Assessment: PAINAD Breathing: normal Negative Vocalization: occasional moan/groan, low speech, negative/disapproving quality Facial Expression: smiling or inexpressive Body Language: relaxed Consolability: no need to console PAINAD Score: 1 Pain Location: unclear Pain Descriptors / Indicators: Moaning Pain Intervention(s): Monitored during session, Repositioned    Home Living Family/patient expects to be discharged to:: Private residence Living Arrangements: Spouse/significant other Available Help at Discharge: Family;Available 24 hours/day Type of Home:  House Home Access: Stairs to enter Entrance Stairs-Rails: Right Entrance Stairs-Number of Steps: 2   Home Layout: One level   Additional Comments: used [facility] when had HH prior to admission    Prior Function Prior Level of Function : Independent/Modified Independent;History of Falls  (last six months)             Mobility Comments: [facility] with HH; fell one week within leaving SNF ADLs Comments: per husband pt was dependent in bathing and dressing - he had to help with toileting, and meals but she did not eat - no appetite     Extremity/Trunk Assessment   Upper Extremity Assessment Upper Extremity Assessment: Defer to OT evaluation    Lower Extremity Assessment Lower Extremity Assessment: Generalized weakness       Communication   Communication Communication: Impaired Factors Affecting Communication: Reduced clarity of speech;Difficulty expressing self    Cognition Arousal: Alert Behavior During Therapy: Flat affect   PT - Cognitive impairments: History of cognitive impairments, Initiation, Sequencing, Problem solving, Safety/Judgement, Memory, Attention   Orientation impairments: Situation                     Following commands: Impaired Following commands impaired: Follows one step commands inconsistently     Cueing Cueing Techniques: Verbal cues, Gestural cues, Visual cues, Tactile cues     General Comments      Exercises     Assessment/Plan    PT Assessment Patient needs continued PT services  PT Problem List Decreased strength;Decreased activity tolerance;Decreased mobility;Decreased balance;Decreased cognition;Decreased safety awareness;Decreased knowledge of use of DME;Obesity       PT Treatment Interventions Gait training;Stair training;Functional mobility training;Therapeutic activities;Therapeutic exercise;Balance training;Neuromuscular re-education;Patient/family education;DME instruction    PT Goals (Current goals can be found in the Care Plan section)  Acute Rehab PT Goals PT Goal Formulation: Patient unable to participate in goal setting Time For Goal Achievement: 11/10/24    Frequency Min 2X/week     Co-evaluation PT/OT/SLP Co-Evaluation/Treatment: Yes Reason for Co-Treatment: Necessary to address  cognition/behavior during functional activity;For patient/therapist safety;To address functional/ADL transfers PT goals addressed during session: Mobility/safety with mobility;Balance;Proper use of DME         AM-PAC PT 6 Clicks Mobility  Outcome Measure Help needed turning from your back to your side while in a flat bed without using bedrails?: Total Help needed moving from lying on your back to sitting on the side of a flat bed without using bedrails?: Total Help needed moving to and from a bed to a chair (including a wheelchair)?: Total Help needed standing up from a chair using your arms (e.g., wheelchair or bedside chair)?: A Lot Help needed to walk in hospital room?: Total Help needed climbing 3-5 steps with a railing? : Total 6 Click Score: 7    End of Session   Activity Tolerance: Other (comment) (limited by cognition) Patient left: in bed;with call bell/phone within reach;with bed alarm set Nurse Communication: Mobility status PT Visit Diagnosis: Other abnormalities of gait and mobility (R26.89);Unsteadiness on feet (R26.81);Muscle weakness (generalized) (M62.81);History of falling (Z91.81)    Time: 9073-9064 PT Time Calculation (min) (ACUTE ONLY): 9 min   Charges:   PT Evaluation $PT Eval Low Complexity: 1 Low   PT General Charges $$ ACUTE PT VISIT: 1 Visit        Ella Golomb, PT, GCS 10/27/24,10:28 AM

## 2024-10-27 NOTE — TOC Progression Note (Addendum)
 Transition of Care [facility]) - Progression Note    Patient Details  Name: JACKIE RUSSMAN MRN: [id number] Date of Birth: 1940-11-03  Transition of [facility] CM/SW Contact  Nathanael CHRISTELLA Ring, RN Phone Number: 10/27/2024, 2:36 PM  Clinical Narrative:     [facility] has declined to offer, bed accepted at [facility], [place] will start insurance authorization.  Called and spoke with [name], she is in agreement with plan.                     Expected Discharge Plan and Services                                               Social Drivers of Health (SDOH) Interventions SDOH Screenings   Food Insecurity: Patient Unable To Answer (10/24/2024)  Housing: Patient Unable To Answer (10/24/2024)  Transportation Needs: Patient Unable To Answer (10/24/2024)  Utilities: Patient Unable To Answer (10/24/2024)  Financial Resource Strain: Low Risk  (10/16/2024)   Received from [facility]  Social Connections: Patient Unable To Answer (10/24/2024)  Tobacco Use: Low Risk (10/20/2024)  Recent Concern: Tobacco Use - Medium Risk (10/18/2024)   Received from [facility]    Readmission Risk Interventions     No data to display

## 2024-10-27 NOTE — Progress Notes (Signed)
" °  Progress Note   Patient: Laura Bishop FMW:[id number] DOB: [birth year] DOA: 10/20/2024     6 DOS: the patient was seen and examined on 10/27/2024   Brief hospital course:  ALBERTINA LEISE is a 84 y.o. female with medical history significant of advanced dementia, HTN, CKD stage IIIb, hypothyroidism, chronic ambulation impairment, PAF on Eliquis , stroke, brought in by family member for evaluation of frequent falls, new onset of back pain.  Patient has advanced dementia, has been eating very little.  Patient currently followed by hospice as outpatient, per husband's request, has been transitioned to comfort care.   Assessment and Plan:  Advanced dementia with delirium. Failure to thrive. Frequent falls. Bilateral rib fractures from the fall. Severe cervical spine stenosis. Patient has advanced dementia and her condition has been deteriorating significantly.  Oral intake remains poor She  has occasional agitation requiring multiple doses of Haldol  IM Currently on comfort measures Not a candidate for inpatient hospice at this time Possible discharge to skilled nursing facility       Chronic kidney disease stage IIIb. Hypernatremia. Severe metabolic acidosis. Hypoglycemia  Related to poor oral intake Patient requires assistance with meals     Paroxysmal atrial fibrillation. Acute on chronic diastolic congestive heart failure.          Subjective: No new complaintS  Physical Exam: Vitals:   10/25/24 0819 10/25/24 1944 10/26/24 2013 10/27/24 0751  BP: (!) 104/56 108/77 134/86 (!) 143/98  Pulse: (!) 110 75 (!) 109 86  Resp: 16 19  15   Temp: 99.1 F (37.3 C) 97.7 F (36.5 C) 98.9 F (37.2 C) 98.5 F (36.9 C)  TempSrc: Axillary Oral Oral   SpO2: 97% 95% 92% 98%  Weight:      Height:       General exam: Appears calm and comfortable  Respiratory system: Clear to auscultation. Respiratory effort normal. Cardiovascular system: S1 & S2 heard, RRR. No JVD, murmurs, rubs,  gallops or clicks. No pedal edema. Gastrointestinal system: Abdomen is nondistended, soft and nontender. No organomegaly or masses felt. Normal bowel sounds heard. Central nervous system: Alert and oriented x1. No focal neurological deficits. Extremities: Able to move all extremities Skin: No rashes, lesions or ulcers Psychiatry: Flat affect with occasional agitation   Data Reviewed:  There are no new results to review at this time.  Family Communication: Plan of care discussed with patient's daughter over the phone  Disposition: Status is: Inpatient Remains inpatient appropriate because: Discharge planning  Planned Discharge Destination: Skilled nursing facility    Time spent: 35 minutes  Author: Aimee Somerset, MD 10/27/2024 12:16 PM  For on call review [email or web address]  "

## 2024-10-28 DIAGNOSIS — R627 Adult failure to thrive: Secondary | ICD-10-CM | POA: Diagnosis not present

## 2024-10-28 DIAGNOSIS — W19XXXD Unspecified fall, subsequent encounter: Secondary | ICD-10-CM | POA: Diagnosis not present

## 2024-10-28 NOTE — Progress Notes (Signed)
" °  Progress Note   Patient: Laura Bishop FMW:[id number] DOB: [birth year] DOA: 10/20/2024     7 DOS: the patient was seen and examined on 10/28/2024   Brief hospital course:  Laura Bishop is a 84 y.o. female with medical history significant of advanced dementia, HTN, CKD stage IIIb, hypothyroidism, chronic ambulation impairment, PAF on Eliquis , stroke, brought in by family member for evaluation of frequent falls, new onset of back pain.  Patient has advanced dementia, has been eating very little.  Patient currently followed by hospice as outpatient, per husband's request, has been transitioned to comfort care.    Assessment and Plan:  Advanced dementia with delirium. Failure to thrive. Frequent falls. Bilateral rib fractures from the fall. Severe cervical spine stenosis. Patient has advanced dementia and her condition has been deteriorating significantly.  Continues to have poor oral intake  .Currently on comfort measures Not a candidate for inpatient hospice at this time Possible discharge to skilled nursing facility       Chronic kidney disease stage IIIb. Hypernatremia. Severe metabolic acidosis. Hypoglycemia  Related to poor oral intake Patient requires assistance with meals     Paroxysmal atrial fibrillation. Acute on chronic diastolic congestive heart failure.          Subjective: Sitting up in bed.  Eating breakfast  Physical Exam: Vitals:   10/26/24 2013 10/27/24 0751 10/27/24 2020 10/28/24 0802  BP: 134/86 (!) 143/98 128/69 120/67  Pulse: (!) 109 86 77 65  Resp:  15  20  Temp: 98.9 F (37.2 C) 98.5 F (36.9 C) 98.4 F (36.9 C) 99.6 F (37.6 C)  TempSrc: Oral  Oral Oral  SpO2: 92% 98% 98% 93%  Weight:      Height:       General exam: Appears calm and comfortable  Respiratory system: Clear to auscultation. Respiratory effort normal. Cardiovascular system: S1 & S2 heard, RRR. No JVD, murmurs, rubs, gallops or clicks. No pedal  edema. Gastrointestinal system: Abdomen is nondistended, soft and nontender. No organomegaly or masses felt. Normal bowel sounds heard. Central nervous system: Alert and oriented x1. No focal neurological deficits. Extremities: Able to move all extremities Skin: No rashes, lesions or ulcers Psychiatry: Flat affect with occasional agitation    Data Reviewed:  There are no new results to review at this time.  Family Communication: None  Disposition: Status is: Inpatient Remains inpatient appropriate because: Awaiting discharge to skilled nursing facility  Planned Discharge Destination: Skilled nursing facility    Time spent: 30 minutes  Author: Aimee Somerset, MD 10/28/2024 10:53 AM  For on call review [email or web address]  "

## 2024-10-28 NOTE — Plan of Care (Signed)
" °  Problem: Pain Management: Goal: Satisfaction with pain management regimen will improve Outcome: Progressing   Problem: Health Behavior/Discharge Planning: Goal: Ability to manage health-related needs will improve Outcome: Progressing   Problem: Clinical Measurements: Goal: Will remain free from infection Outcome: Progressing Goal: Cardiovascular complication will be avoided Outcome: Progressing   Problem: Activity: Goal: Risk for activity intolerance will decrease Outcome: Progressing   Problem: Coping: Goal: Level of anxiety will decrease Outcome: Progressing   "

## 2024-10-29 DIAGNOSIS — W19XXXD Unspecified fall, subsequent encounter: Secondary | ICD-10-CM | POA: Diagnosis not present

## 2024-10-29 DIAGNOSIS — R627 Adult failure to thrive: Secondary | ICD-10-CM | POA: Diagnosis not present

## 2024-10-29 MED ORDER — CHLORHEXIDINE GLUCONATE CLOTH 2 % EX PADS
6.0000 | MEDICATED_PAD | Freq: Every day | CUTANEOUS | Status: DC
  Administered 2024-10-29 – 2024-10-30 (×2): 6 via TOPICAL

## 2024-10-29 MED ORDER — TAMSULOSIN HCL 0.4 MG PO CAPS
0.4000 mg | ORAL_CAPSULE | Freq: Every day | ORAL | Status: DC
  Administered 2024-10-29 – 2024-10-31 (×3): 0.4 mg via ORAL
  Filled 2024-10-29 (×3): qty 1

## 2024-10-29 NOTE — Plan of Care (Signed)

## 2024-10-29 NOTE — Plan of Care (Signed)
" °  Problem: Education: Goal: Knowledge of the prescribed therapeutic regimen will improve Outcome: Progressing   Problem: Clinical Measurements: Goal: Quality of life will improve Outcome: Progressing   Problem: Respiratory: Goal: Verbalizations of increased ease of respirations will increase Outcome: Progressing   Problem: Role Relationship: Goal: Family's ability to cope with current situation will improve Outcome: Progressing Goal: Ability to verbalize concerns, feelings, and thoughts to partner or family member will improve Outcome: Progressing   Problem: Pain Management: Goal: Satisfaction with pain management regimen will improve Outcome: Progressing   Problem: Education: Goal: Knowledge of General Education information will improve Description: Including pain rating scale, medication(s)/side effects and non-pharmacologic comfort measures Outcome: Progressing   Problem: Clinical Measurements: Goal: Ability to maintain clinical measurements within normal limits will improve Outcome: Progressing Goal: Will remain free from infection Outcome: Progressing Goal: Diagnostic test results will improve Outcome: Progressing Goal: Respiratory complications will improve Outcome: Progressing Goal: Cardiovascular complication will be avoided Outcome: Progressing   "

## 2024-10-29 NOTE — Progress Notes (Signed)
" °  Progress Note   Patient: Laura Bishop FMW:[id number] DOB: 1940-12-20 DOA: 10/20/2024     8 DOS: the patient was seen and examined on 10/29/2024   Brief hospital course:  EMELY FAHY is a 84 y.o. female with medical history significant of advanced dementia, HTN, CKD stage IIIb, hypothyroidism, chronic ambulation impairment, PAF on Eliquis , stroke, brought in by family member for evaluation of frequent falls, new onset of back pain.  Patient has advanced dementia, has been eating very little.  Patient currently followed by hospice as outpatient, per husband's request, has been transitioned to comfort care.     Assessment and Plan:  Advanced dementia with delirium. Failure to thrive. Frequent falls. Bilateral rib fractures from the fall. Severe cervical spine stenosis. Patient has advanced dementia and her condition has been deteriorating significantly.  Continues to have poor oral intake   .Currently on comfort measures Not a candidate for inpatient hospice at this time Possible discharge to skilled nursing facility       Chronic kidney disease stage IIIb. Hypernatremia. Severe metabolic acidosis. Hypoglycemia  Related to poor oral intake Patient requires assistance with meals     Paroxysmal atrial fibrillation. Acute on chronic diastolic congestive heart failure.            Subjective: Patient is awake.  Oriented only to person.  Had an uneventful night  Physical Exam: Vitals:   10/27/24 0751 10/27/24 2020 10/28/24 0802 10/29/24 0829  BP: (!) 143/98 128/69 120/67 115/67  Pulse: 86 77 65 64  Resp: 15  20 14   Temp: 98.5 F (36.9 C) 98.4 F (36.9 C) 99.6 F (37.6 C) 98.3 F (36.8 C)  TempSrc:  Oral Oral   SpO2: 98% 98% 93% 96%  Weight:      Height:       General exam: Appears calm and comfortable  Respiratory system: Clear to auscultation. Respiratory effort normal. Cardiovascular system: S1 & S2 heard, RRR. No JVD, murmurs, rubs, gallops or clicks.  No pedal edema. Gastrointestinal system: Abdomen is nondistended, soft and nontender. No organomegaly or masses felt. Normal bowel sounds heard. Central nervous system: Alert and oriented x1. No focal neurological deficits. Extremities: Able to move all extremities Skin: No rashes, lesions or ulcers Psychiatry: Flat affect with occasional agitation     Data Reviewed:  There are no new results to review at this time.  Family Communication: Attempted to reach patient's daughter over the phone.  Unable to leave a voicemail.  Disposition: Status is: Inpatient Remains inpatient appropriate because: Awaiting discharge to SNF  Planned Discharge Destination: Skilled nursing facility    Time spent: 30 minutes  Author: Aimee Somerset, MD 10/29/2024 11:57 AM  For on call review [email or web address]  "

## 2024-10-30 DIAGNOSIS — W19XXXD Unspecified fall, subsequent encounter: Secondary | ICD-10-CM | POA: Diagnosis not present

## 2024-10-30 DIAGNOSIS — R627 Adult failure to thrive: Secondary | ICD-10-CM | POA: Diagnosis not present

## 2024-10-30 NOTE — Progress Notes (Signed)
" °  Progress Note   Patient: Laura Bishop FMW:[id number] DOB: Dec 03, 1940 DOA: 10/20/2024     9 DOS: the patient was seen and examined on 10/30/2024   Brief hospital course:  Laura Bishop is a 84 y.o. female with medical history significant of advanced dementia, HTN, CKD stage IIIb, hypothyroidism, chronic ambulation impairment, PAF on Eliquis , stroke, brought in by family member for evaluation of frequent falls, new onset of back pain.  Patient has advanced dementia, has been eating very little.  Patient currently followed by hospice as outpatient, per husband's request, has been transitioned to comfort care.   Assessment and Plan:   Advanced dementia with delirium. Failure to thrive. Frequent falls. Bilateral rib fractures from the fall. Severe cervical spine stenosis. Patient has advanced dementia and her condition has been deteriorating significantly.  Continues to have poor oral intake   .Currently on comfort measures Not a candidate for inpatient hospice at this time Possible discharge to skilled nursing facility       Chronic kidney disease stage IIIb. Hypernatremia. Severe metabolic acidosis. Hypoglycemia  Related to poor oral intake Patient requires assistance with meals     Paroxysmal atrial fibrillation. Acute on chronic diastolic congestive heart failure.               Subjective: Requesting a pain patch  Physical Exam: Vitals:   10/28/24 0802 10/29/24 0829 10/29/24 2015 10/30/24 0825  BP: 120/67 115/67 122/68 121/69  Pulse: 65 64 88 (!) 109  Resp: 20 14 16 20   Temp: 99.6 F (37.6 C) 98.3 F (36.8 C) 98.3 F (36.8 C) 98.5 F (36.9 C)  TempSrc: Oral     SpO2: 93% 96% 96% 91%  Weight:      Height:       General exam: Agitated this morning.  Requesting a pain patch Respiratory system: Clear to auscultation. Respiratory effort normal. Cardiovascular system: S1 & S2 heard, RRR. No JVD, murmurs, rubs, gallops or clicks. No pedal  edema. Gastrointestinal system: Abdomen is nondistended, soft and nontender. No organomegaly or masses felt. Normal bowel sounds heard. Central nervous system: Alert and oriented x1. No focal neurological deficits. Extremities: Able to move all extremities Skin: No rashes, lesions or ulcers Psychiatry: Flat affect with occasional agitation   Data Reviewed:  There are no new results to review at this time.  Family Communication: None  Disposition: Status is: Inpatient Remains inpatient appropriate because: Discharge planning  Planned Discharge Destination: Skilled nursing facility    Time spent: 30  minutes  Author: Aimee Somerset, MD 10/30/2024 11:19 AM  For on call review [email or web address]  "

## 2024-10-30 NOTE — Plan of Care (Signed)
" °  Problem: Clinical Measurements: Goal: Quality of life will improve Outcome: Progressing   Problem: Respiratory: Goal: Verbalizations of increased ease of respirations will increase Outcome: Progressing   Problem: Role Relationship: Goal: Ability to verbalize concerns, feelings, and thoughts to partner or family member will improve Outcome: Progressing   Problem: Pain Management: Goal: Satisfaction with pain management regimen will improve Outcome: Progressing   "

## 2024-10-30 NOTE — Plan of Care (Signed)

## 2024-10-31 DIAGNOSIS — W19XXXD Unspecified fall, subsequent encounter: Secondary | ICD-10-CM | POA: Diagnosis not present

## 2024-10-31 DIAGNOSIS — R627 Adult failure to thrive: Secondary | ICD-10-CM | POA: Diagnosis not present

## 2024-10-31 MED ORDER — LORAZEPAM 0.5 MG PO TABS
0.5000 mg | ORAL_TABLET | Freq: Four times a day (QID) | ORAL | Status: DC | PRN
  Administered 2024-10-31: 0.5 mg via ORAL
  Filled 2024-10-31: qty 1

## 2024-10-31 MED ORDER — LORAZEPAM 2 MG/ML IJ SOLN: 0.5000 mg | Freq: Four times a day (QID) | INTRAMUSCULAR | Status: DC | PRN

## 2024-10-31 NOTE — Progress Notes (Signed)
 Physical Therapy Treatment Patient Details Name: Laura Bishop MRN: [id number] DOB: 06/20/1941 Today's Date: 10/31/2024   History of Present Illness Laura Bishop is a 84 y.o. female with medical history significant of advanced dementia, HTN, CKD stage IIIb, hypothyroidism, chronic ambulation impairment, PAF on Eliquis , stroke, brought in by family member for evaluation of frequent falls, new onset of back pain.    PT Comments  Pt received in room with nursing about to assist to [facility]. Co-tx with OT for safety in order to progress safe functional mobility. Pt impulsive at times, however cooperative throughout session. No additional physical assist for transfers with CGA, RW during gait training, no LOB. Overall good tolerance for activity. Encouraged sitting up in chair, pt declined and returning to bed. Continue PT per POC. Initial recs for STR remain appropriate.    If plan is discharge home, recommend the following: Help with stairs or ramp for entrance;Assist for transportation;Assistance with feeding;Assistance with cooking/housework;Supervision due to cognitive status;A lot of help with walking and/or transfers;A lot of help with bathing/dressing/bathroom   Can travel by private vehicle     Yes  Equipment Recommendations  Other (comment) (TBD at next level of care)    Recommendations for Other Services       Precautions / Restrictions Precautions Precautions: Fall Recall of Precautions/Restrictions: Impaired Precaution/Restrictions Comments: hx of progressing dementia Restrictions Weight Bearing Restrictions Per Provider Order: No     Mobility  Bed Mobility                    Transfers                        Ambulation/Gait Ambulation/Gait assistance: Contact guard assist Gait Distance (Feet): 40 Feet Assistive device: Rolling walker (2 wheels) Gait Pattern/deviations: Step-to pattern, Step-through pattern, Drifts right/left, Trunk flexed, Narrow  base of support Gait velocity: decreased     General Gait Details:  (Short distance gait due to pt's limited attention level and cognitive impairments)   Stairs             Wheelchair Mobility     Tilt Bed    Modified Rankin (Stroke Patients Only)       Balance           Standing balance support: Bilateral upper extremity supported, During functional activity, Reliant on assistive device for balance Standing balance-Leahy Scale: Fair                              Communication Communication Communication: Impaired Factors Affecting Communication: Reduced clarity of speech;Difficulty expressing self  Cognition Arousal: Alert Behavior During Therapy: Flat affect, Restless, Impulsive   PT - Cognitive impairments: History of cognitive impairments, Initiation, Sequencing, Problem solving, Safety/Judgement, Memory, Attention                         Following commands: Impaired Following commands impaired: Follows one step commands inconsistently, Follows one step commands with increased time    Cueing Cueing Techniques: Verbal cues, Gestural cues, Visual cues, Tactile cues  Exercises      General Comments General comments (skin integrity, edema, etc.): Pt only tolerated sitting up in chair for ~2 minutes and requested to return to bed      Pertinent Vitals/Pain Pain Assessment Pain Assessment: Faces Faces Pain Scale: Hurts a little bit Pain Location: unclear Pain Descriptors / Indicators: Moaning  Pain Intervention(s): Monitored during session    Home Living                          Prior Function            PT Goals (current goals can now be found in the care plan section) Progress towards PT goals: Progressing toward goals    Frequency    Min 2X/week      PT Plan      Co-evaluation   Reason for Co-Treatment: Necessary to address cognition/behavior during functional activity;For patient/therapist safety;To  address functional/ADL transfers PT goals addressed during session: Mobility/safety with mobility;Balance;Proper use of DME OT goals addressed during session: ADL's and self-care;Proper use of Adaptive equipment and DME      AM-PAC PT 6 Clicks Mobility   Outcome Measure  Help needed turning from your back to your side while in a flat bed without using bedrails?: A Lot Help needed moving from lying on your back to sitting on the side of a flat bed without using bedrails?: A Lot Help needed moving to and from a bed to a chair (including a wheelchair)?: A Lot Help needed standing up from a chair using your arms (e.g., wheelchair or bedside chair)?: A Little Help needed to walk in hospital room?: A Little Help needed climbing 3-5 steps with a railing? : A Lot 6 Click Score: 14    End of Session Equipment Utilized During Treatment: Gait belt Activity Tolerance: Patient tolerated treatment well Patient left: in bed;with call bell/phone within reach;with bed alarm set;with family/visitor present Nurse Communication: Mobility status PT Visit Diagnosis: Other abnormalities of gait and mobility (R26.89);Unsteadiness on feet (R26.81);Muscle weakness (generalized) (M62.81);History of falling (Z91.81)     Time: 8898-8875 PT Time Calculation (min) (ACUTE ONLY): 23 min  Charges:    $Therapeutic Activity: 8-22 mins PT General Charges $$ ACUTE PT VISIT: 1 Visit                    Darice Bohr, PTA  Darice JAYSON Bohr 10/31/2024, 1:02 PM

## 2024-10-31 NOTE — TOC Progression Note (Signed)
 Transition of Care [facility]) - Progression Note    Patient Details  Name: Laura Bishop MRN: [id number] Date of Birth: 04-May-1941  Transition of [facility]) CM/SW Contact  Nathanael CHRISTELLA Ring, RN Phone Number: 10/31/2024, 12:25 PM  Clinical Narrative:     Received a call from [employer] is requesting updated therapy notes for authorization.  PT and OT messaged and did see patient this morning, will send the notes via the hub when they are in.                     Expected Discharge Plan and Services                                               Social Drivers of Health (SDOH) Interventions SDOH Screenings   Food Insecurity: Patient Unable To Answer (10/24/2024)  Housing: Patient Unable To Answer (10/24/2024)  Transportation Needs: Patient Unable To Answer (10/24/2024)  Utilities: Patient Unable To Answer (10/24/2024)  Financial Resource Strain: Low Risk  (10/16/2024)   [facility] from [facility]  Social Connections: Patient Unable To Answer (10/24/2024)  Tobacco Use: Low Risk (10/20/2024)  Recent Concern: Tobacco Use - Medium Risk (10/18/2024)   Received from [facility]    Readmission Risk Interventions     No data to display

## 2024-10-31 NOTE — Progress Notes (Signed)
 Occupational Therapy Treatment Patient Details Name: Laura Bishop MRN: [id number] DOB: [birth year] Today's Date: 10/31/2024   History of present illness Laura Bishop is a 84 y.o. female with medical history significant of advanced dementia, HTN, CKD stage IIIb, hypothyroidism, chronic ambulation impairment, PAF on Eliquis , stroke, brought in by family member for evaluation of frequent falls, new onset of back pain.   OT comments  Pt seen for OT and PT co-tx to optimize ADL/mobility safety. Pt oriented to self, seated on BSC with nursing upon OT's arrival. Pt required set up and CGA to stand for pericare maintaining LUE on RW for stability and no overt LOB noted. Pt ambulated to the door with CGA using RW afterwards before requiring a seated rest break. Pt following simple commands with increased time. Pt fairly restless. Once seated in recliner after walking with PT pt promptly requesting return to bed. Once in bed requesting to sit up. VC for redirecting pt to visitors there to see her. Bed moved into chair position. Pt demonstrating progress towards goals.       If plan is discharge home, recommend the following:  Assistance with cooking/housework;Assist for transportation;Help with stairs or ramp for entrance;Direct supervision/assist for medications management;Direct supervision/assist for financial management;Supervision due to cognitive status;A little help with walking and/or transfers;A lot of help with bathing/dressing/bathroom   Equipment Recommendations  Other (comment) (defer)    Recommendations for Other Services      Precautions / Restrictions Precautions Precautions: Fall Recall of Precautions/Restrictions: Impaired Restrictions Weight Bearing Restrictions Per Provider Order: No       Mobility Bed Mobility Overal bed mobility: Needs Assistance Bed Mobility: Supine to Sit, Sit to Supine     Supine to sit: Min assist Sit to supine: Min assist         Transfers Overall transfer level: Needs assistance Equipment used: Rolling walker (2 wheels) Transfers: Sit to/from Stand Sit to Stand: Min assist, Contact guard assist                 Balance Overall balance assessment: Needs assistance Sitting-balance support: Feet supported Sitting balance-Leahy Scale: Fair     Standing balance support: Single extremity supported, During functional activity, Reliant on assistive device for balance Standing balance-Leahy Scale: Fair Standing balance comment: able to complete pericare using RUE while maintaining LUE on RW for stability, no overt LOB                           ADL either performed or assessed with clinical judgement   ADL Overall ADL's : Needs assistance/impaired     Grooming: Sitting;Set up                   Toilet Transfer: Contact guard assist;BSC/3in1;Rolling walker (2 wheels);Stand-pivot;Cueing for sequencing;Cueing for safety   Toileting- Clothing Manipulation and Hygiene: Set up;Contact guard assist;Sit to/from stand Toileting - Clothing Manipulation Details (indicate cue type and reason): LUE on RW            Extremity/Trunk Assessment              Vision       Perception     Praxis     Communication Communication Communication: Impaired Factors Affecting Communication: Reduced clarity of speech;Difficulty expressing self   Cognition Arousal: Alert Behavior During Therapy: Flat affect, Restless, Impulsive Cognition: History of cognitive impairments, Cognition impaired  Following commands impaired: Follows one step commands inconsistently, Follows one step commands with increased time      Cueing   Cueing Techniques: Verbal cues, Gestural cues, Visual cues, Tactile cues  Exercises      Shoulder Instructions       General Comments      Pertinent Vitals/ Pain       Pain Assessment Pain Assessment: Faces Faces Pain  Scale: No hurt  Home Living                                          Prior Functioning/Environment              Frequency  Min 1X/week        Progress Toward Goals  OT Goals(current goals can now be found in the care plan section)  Progress towards OT goals: Progressing toward goals  Acute Rehab OT Goals Patient Stated Goal: pt does not state OT Goal Formulation: Patient unable to participate in goal setting Time For Goal Achievement: 11/10/24 Potential to Achieve Goals: Fair  Plan      Co-evaluation    PT/OT/SLP Co-Evaluation/Treatment: Yes Reason for Co-Treatment: Necessary to address cognition/behavior during functional activity;For patient/therapist safety;To address functional/ADL transfers PT goals addressed during session: Mobility/safety with mobility;Balance;Proper use of DME OT goals addressed during session: ADL's and self-care;Proper use of Adaptive equipment and DME      AM-PAC OT 6 Clicks Daily Activity     Outcome Measure   Help from another person eating meals?: A Little Help from another person taking care of personal grooming?: A Little Help from another person toileting, which includes using toliet, bedpan, or urinal?: A Little Help from another person bathing (including washing, rinsing, drying)?: A Lot Help from another person to put on and taking off regular upper body clothing?: A Little Help from another person to put on and taking off regular lower body clothing?: A Lot 6 Click Score: 16    End of Session Equipment Utilized During Treatment: Rolling walker (2 wheels)  OT Visit Diagnosis: History of falling (Z91.81);Muscle weakness (generalized) (M62.81);Cognitive communication deficit (R41.841) Symptoms and signs involving cognitive functions: Other cerebrovascular disease   Activity Tolerance Patient tolerated treatment well   Patient Left in bed;with call bell/phone within reach;with bed alarm set;with  family/visitor present   Nurse Communication          Time: 8898-8875 OT Time Calculation (min): 23 min  Charges: OT General Charges $OT Visit: 1 Visit OT Treatments $Self Care/Home Management : 8-22 mins  Warren SAUNDERS., MPH, MS, OTR/L ascom [phone] 10/31/24, 12:42 PM

## 2024-10-31 NOTE — Progress Notes (Addendum)
" °  Progress Note   Patient: Laura Bishop FMW:[id number] DOB: 08-20-1941 DOA: 10/20/2024     10 DOS: the patient was seen and examined on 10/31/2024   Brief hospital course:  RECHEL DELOSREYES is a 84 y.o. female with medical history significant of advanced dementia, HTN, CKD stage IIIb, hypothyroidism, chronic ambulation impairment, PAF on Eliquis , stroke, brought in by family member for evaluation of frequent falls, new onset of back pain.  Patient has advanced dementia, has been eating very little.  Patient currently followed by hospice as outpatient, per husband's request, has been transitioned to comfort care.    Assessment and Plan:  Advanced dementia with delirium. Failure to thrive. Frequent falls. Bilateral rib fractures from the fall. Severe cervical spine stenosis. Patient has advanced dementia and her condition has been deteriorating significantly.  Continues to have poor oral intake   .Currently on comfort measures Not a candidate for inpatient hospice at this time Possible discharge to skilled nursing facility       Chronic kidney disease stage IIIb. Hypernatremia. Severe metabolic acidosis. Hypoglycemia  Related to poor oral intake Patient requires assistance with meals     Paroxysmal atrial fibrillation. Acute on chronic diastolic congestive heart failure.    Urinary retention Foley catheter was placed due to increased bladder residual volume Patient pulled out the Foley In-N-Out cath as needed Bladder scan every shift      Subjective: Awake.  No events overnight  Physical Exam: Vitals:   10/29/24 0829 10/29/24 2015 10/30/24 0825 10/30/24 1946  BP: 115/67 122/68 121/69 (!) 141/85  Pulse: 64 88 (!) 109 (!) 109  Resp: 14 16 20 16   Temp: 98.3 F (36.8 C) 98.3 F (36.8 C) 98.5 F (36.9 C) 98.4 F (36.9 C)  TempSrc:    Oral  SpO2: 96% 96% 91% 97%  Weight:      Height:       General exam: Appears calm.  No distress Respiratory system: Clear to  auscultation. Respiratory effort normal. Cardiovascular system: S1 & S2 heard, RRR. No JVD, murmurs, rubs, gallops or clicks. No pedal edema. Gastrointestinal system: Abdomen is nondistended, soft and nontender. No organomegaly or masses felt. Normal bowel sounds heard. Central nervous system: Alert and oriented x1. No focal neurological deficits. Extremities: Able to move all extremities Skin: No rashes, lesions or ulcers Psychiatry: Flat affect with occasional agitation   Data Reviewed:  There are no new results to review at this time.  Family Communication: Discussed plan of care with patient's daughter, [name] Munroe over the phone.  All questions and concerns have been addressed.  She verbalizes understanding and agrees with the plan.  Patient is currently awaiting insurance approval for discharge to SNF  Disposition: Status is: Inpatient Remains inpatient appropriate because: Discharge planning.  Awaiting insurance approval  Planned Discharge Destination: Skilled nursing facility    Time spent: 30 minutes  Author: Aimee Somerset, MD 10/31/2024 11:31 AM  For on call review [email or web address].  "

## 2024-10-31 NOTE — Plan of Care (Signed)
  Problem: Education: Goal: Knowledge of the prescribed therapeutic regimen will improve Outcome: Progressing   Problem: Coping: Goal: Ability to identify and develop effective coping behavior will improve Outcome: Progressing   Problem: Clinical Measurements: Goal: Quality of life will improve Outcome: Progressing   Problem: Respiratory: Goal: Verbalizations of increased ease of respirations will increase Outcome: Progressing

## 2024-10-31 NOTE — Plan of Care (Signed)
  Problem: Activity: Goal: Risk for activity intolerance will decrease Outcome: Progressing   Problem: Elimination: Goal: Will not experience complications related to bowel motility Outcome: Progressing   

## 2024-10-31 NOTE — Progress Notes (Signed)
 Mobility Specialist - Progress Note     10/31/24 1252  Mobility  Activity Ambulated with assistance;Stood with assistance  Level of Assistance Minimal assist, patient does 75% or more  Assistive Device Front wheel walker  Distance Ambulated (ft) 50 ft  Range of Motion/Exercises Active  Activity Response Tolerated well  Mobility Referral Yes  Mobility visit 1 Mobility   Pt yelling in bed upon entry. Pt STS and ambulates to hallway MinA with chair follow for safety +2 assist. Pt returned to bed and left with needs in reach. Bed alarm activated.   Guido Rumble Mobility Specialist 10/31/24, 12:56 PM   .

## 2024-11-01 DIAGNOSIS — W19XXXD Unspecified fall, subsequent encounter: Secondary | ICD-10-CM | POA: Diagnosis not present

## 2024-11-01 DIAGNOSIS — R627 Adult failure to thrive: Secondary | ICD-10-CM | POA: Diagnosis not present

## 2024-11-01 DIAGNOSIS — F03918 Unspecified dementia, unspecified severity, with other behavioral disturbance: Secondary | ICD-10-CM | POA: Diagnosis not present

## 2024-11-01 MED ORDER — TAMSULOSIN HCL 0.4 MG PO CAPS: 0.4000 mg | ORAL_CAPSULE | Freq: Every day | ORAL | Status: AC

## 2024-11-01 MED ORDER — SENNOSIDES-DOCUSATE SODIUM 8.6-50 MG PO TABS: 1.0000 | ORAL_TABLET | Freq: Every evening | ORAL | Status: AC | PRN

## 2024-11-01 MED ORDER — HALOPERIDOL 2 MG PO TABS: 2.0000 mg | ORAL_TABLET | Freq: Four times a day (QID) | ORAL | Status: AC | PRN

## 2024-11-01 MED ORDER — ONDANSETRON HCL 4 MG PO TABS: 4.0000 mg | ORAL_TABLET | Freq: Four times a day (QID) | ORAL | Status: AC | PRN

## 2024-11-01 MED ORDER — OXYCODONE HCL 5 MG PO TABS: 5.0000 mg | ORAL_TABLET | Freq: Four times a day (QID) | ORAL | 0 refills | Status: AC | PRN

## 2024-11-01 MED ORDER — LORAZEPAM 0.5 MG PO TABS: 0.5000 mg | ORAL_TABLET | Freq: Four times a day (QID) | ORAL | 0 refills | Status: AC | PRN

## 2024-11-01 NOTE — Plan of Care (Signed)
  Problem: Coping: Goal: Ability to identify and develop effective coping behavior will improve Outcome: Progressing   Problem: Clinical Measurements: Goal: Quality of life will improve Outcome: Progressing   Problem: Respiratory: Goal: Verbalizations of increased ease of respirations will increase Outcome: Progressing

## 2024-11-01 NOTE — Progress Notes (Signed)
 I spoke with Delon, LPN, and provided her with the report. [facility] transported the patient to [facility], and the patient was reported to be stable.

## 2024-11-01 NOTE — Discharge Summary (Signed)
 " Physician Discharge Summary   Patient: Laura Bishop MRN: [id number] DOB: 05/14/1941  Admit date:     10/20/2024  Discharge date: 11/01/24  Discharge Physician: Murvin Mana   PCP: Sherial Bail, MD   Recommendations at discharge:   Follow-up with hospice in nursing home.  Discharge Diagnoses: Principal Problem:   Fall Active Problems:   Dementia with behavioral disturbance (HCC)   CKD stage 3b, GFR 30-44 ml/min (HCC)   Fall at home, initial encounter   Hypernatremia   Cervical spinal stenosis   Hypoglycemia   Failure to thrive in adult  Resolved Problems:   * No resolved hospital problems. *  Hospital Course: Laura Bishop is a 84 y.o. female with medical history significant of advanced dementia, HTN, CKD stage IIIb, hypothyroidism, chronic ambulation impairment, PAF on Eliquis , stroke, brought in by family member for evaluation of frequent falls, new onset of back pain.  Patient has advanced dementia, has been eating very little.  Patient currently followed by hospice as outpatient, per husband's request, to comfort care.  Patient will be transferred to nursing home with comfort care.  Assessment and Plan: Advanced dementia with delirium. Failure to thrive. Frequent falls. Bilateral rib fractures from the fall. Severe cervical spine stenosis. Patient has advanced dementia and her condition has been deteriorating significantly.  Continues to have poor oral intake Patient has been on comfort care in the hospital, patient has been accepted to SNF with comfort care.     Chronic kidney disease stage IIIb. Hypernatremia. Severe metabolic acidosis. Hypoglycemia  Related to poor oral intake No additional treatment.     Paroxysmal atrial fibrillation. Acute on chronic diastolic congestive heart failure. No additional treatment.   Urinary retention Foley catheter was placed due to increased bladder residual volume Patient pulled out the Foley In-N-Out cath as  needed Bladder scan every shift  Continue the Flomax          Consultants: None Procedures performed: None  Disposition: Hospice care Diet recommendation:  Discharge Diet Orders (From admission, onward)     Start     Ordered   11/01/24 0000  Diet general       Comments: Dys 2 diet   11/01/24 1146           Dys 2 diet DISCHARGE MEDICATION: Allergies as of 11/01/2024       Reactions   Other Other (See Comments)   Contraindicated by renal insufficiency Contraindicated by renal insufficiency   Estrogens    Nsaids         Medication List     STOP taking these medications    amLODipine  10 MG tablet Commonly known as: NORVASC    diphenhydramine -acetaminophen  25-500 MG Tabs tablet Commonly known as: TYLENOL  PM   Eliquis  2.5 MG Tabs tablet Generic drug: apixaban    gabapentin 300 MG capsule Commonly known as: NEURONTIN   levothyroxine  75 MCG tablet Commonly known as: SYNTHROID    metoprolol  succinate 25 MG 24 hr tablet Commonly known as: TOPROL -XL   multivitamin tablet   rosuvastatin  5 MG tablet Commonly known as: CRESTOR    solifenacin 10 MG tablet Commonly known as: VESICARE   VITAMIN D  PO       TAKE these medications    acetaminophen  500 MG tablet Commonly known as: TYLENOL  Take by mouth.   haloperidol  2 MG tablet Commonly known as: HALDOL  Take 1 tablet (2 mg total) by mouth every 6 (six) hours as needed for agitation.   lidocaine  5 % Commonly known as: LIDODERM   Place 1 patch onto the skin daily.   LORazepam  0.5 MG tablet Commonly known as: ATIVAN  Take 1 tablet (0.5 mg total) by mouth every 6 (six) hours as needed for anxiety.   mirtazapine  7.5 MG tablet Commonly known as: REMERON  Take 1 tablet (7.5 mg total) by mouth at bedtime.   ondansetron  4 MG tablet Commonly known as: ZOFRAN  Take 1 tablet (4 mg total) by mouth every 6 (six) hours as needed for nausea.   oxyCODONE  5 MG immediate release tablet Commonly known as: Oxy  IR/ROXICODONE  Take 1 tablet (5 mg total) by mouth every 6 (six) hours as needed.   QUEtiapine  25 MG tablet Commonly known as: SEROQUEL  Take 1 tablet (25 mg total) by mouth at bedtime.   senna-docusate 8.6-50 MG tablet Commonly known as: Senokot-S Take 1 tablet by mouth at bedtime as needed for mild constipation.   tamsulosin  0.4 MG Caps capsule Commonly known as: FLOMAX  Take 1 capsule (0.4 mg total) by mouth daily after supper.               Discharge Care Instructions  (From admission, onward)           Start     Ordered   11/01/24 0000  Discharge wound care:       Comments: None documented   11/01/24 1146            Contact information for after-discharge care     Destination     [facility] .   Service: Skilled Nursing Contact information: [address] [place]  [phone]                    Discharge Exam: Laura Bishop   10/20/24 0953  Weight: 65 kg   General exam: Appears calm and comfortable  Respiratory system: Clear to auscultation. Respiratory effort normal. Cardiovascular system: S1 & S2 heard, RRR. No JVD, murmurs, rubs, gallops or clicks. No pedal edema. Gastrointestinal system: Abdomen is nondistended, soft and nontender. No organomegaly or masses felt. Normal bowel sounds heard. Central nervous system: Alert and oriented x1. No focal neurological deficits. Extremities: Symmetric 5 x 5 power. Skin: No rashes, lesions or ulcers Psychiatry: Flat affect   Condition at discharge: poor  The results of significant diagnostics from this hospitalization (including imaging, microbiology, ancillary and laboratory) are listed below for reference.   Imaging Studies: MR CERVICAL SPINE WO CONTRAST Result Date: 10/20/2024 EXAM: MRI CERVICAL SPINE WITHOUT CONTRAST 10/20/2024 03:54:58 PM TECHNIQUE: Multiplanar multisequence MRI of the cervical spine was performed. COMPARISON: CT cervical spine  10/20/2024. CLINICAL HISTORY: Spine fracture, cervical, traumatic. Fall. FINDINGS: LIMITATIONS/ARTIFACTS: The examination is motion degraded, including moderate to severe motion on the sagittal STIR and both axial sequences. BONES AND ALIGNMENT: Exaggerated upper thoracic kyphosis and lower cervical lordosis. T3 superior endplate compression fracture with 25% vertebral body height loss and moderate marrow edema. No gross fracture or marrow edema in the cervical spine with assessment limited by motion artifact. SPINAL CORD: Assessment of the spinal cord is limited by motion, however no cord edema is evident on the sagittal T2 sequence which is only mildly degraded. No visible epidural hematoma. SOFT TISSUES: Mild prevertebral edema greatest from C1 to C3. No discrete anterior longitudinal ligament disruption on this motion integrated examination. Moderate marrow edema throughout the right greater than left posterior paraspinal soft tissues. DISC LEVELS: Detailed assessment of degenerative changes is limited by motion. Disc bulging and infolding of the ligamentum flavum result in  mild spinal stenosis at C3-C4, moderate spinal stenosis at C4-C5, and severe spinal stenosis at C5-C6 and C6-C7 with at least mild spinal cord mass effect at C5-C6 and C6-C7. IMPRESSION: 1. Motion degraded examination. 2. Mild prevertebral edema and moderate posterior paraspinal soft tissue edema. No discrete ligamentous disruption or epidural tumor identified within study limitations. 3. Acute T3 compression fracture with 25% vertebral body height loss. 4. Suspected severe spinal stenosis at C5-C6 and C6-C7 and moderate spinal stenosis at C4-C5. Electronically signed by: Dasie Hamburg MD MD 10/20/2024 04:33 PM EST RP Workstation: [id number]   MR LUMBAR SPINE WO CONTRAST Result Date: 10/20/2024 EXAM: MRI LUMBAR SPINE 10/20/2024 03:54:58 PM TECHNIQUE: Multiplanar multisequence MRI of the lumbar spine was performed without the administration of  intravenous contrast. COMPARISON: CT chest, abdomen, and pelvis 10/20/2024. CLINICAL HISTORY: Low back pain, increased fracture risk. Fall. Left hip pain. FINDINGS: BONES AND ALIGNMENT: 5 lumbar type of vertebrae. Mild lumbar dextroscoliosis. Trace anterolisthesis of L3, L4, and L5 on S1. Partially visualized marrow edema in the sacral ala bilaterally and extending transversely through the anterior S3 segment consistent with acute fractures. Normal vertebral body heights. SPINAL CORD: The conus medullaris terminates at L1-L2 and is normal in signal. 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 Mild presacral soft tissue swelling/fluid. The abdominal and pelvic viscera were more fully evaluated on today's CT. DISC LEVELS: Disc desiccation throughout the lumbar spine. Severe disc space narrowing at L5-S1. T12-L1: Small central disc protrusion without stenosis. L1-L2: Shallow central disc protrusion with annular fissure and mild facet and ligamentum flavum hypertrophy without stenosis. L2-L3: Mild disc bulging and mild facet hypertrophy and mild stenosis. L3-L4: Anterolisthesis with bulging of uncovered disc and moderate facet hypertrophy without stenosis. Right foraminal to extraforaminal annular fissure. L4-L5: Mild disc bulging and moderate facet hypertrophy without stenosis. Small bilateral foraminal annular fissures. L5-S1: Anterolisthesis with bulging of uncovered disc and severe facet hypertrophy result in mild bilateral neural foraminal stenosis without spinal stenosis. IMPRESSION: 1. Acute bilateral sacral fractures. 2. Multilevel lumbar disc and facet degeneration without high-grade stenosis. Electronically signed by: Dasie Hamburg MD MD 10/20/2024 04:20 PM EST RP Workstation: [id number]   ECHOCARDIOGRAM COMPLETE Result Date: 10/20/2024    ECHOCARDIOGRAM REPORT   Patient Name:   Laura Bishop Date of Exam: 10/20/2024 Medical Rec #:  [id number]        Height:       60.0 in Accession #:    [id number]        Weight:       143.3 lb Date of Birth:  05/25/1941        BSA:          1.620 m Patient Age:    83 years         BP:           122/76 mmHg Patient Gender: F                HR:           72 bpm. Exam Location:  [facility] Procedure: 2D Echo, Cardiac Doppler and Color Doppler (Both Spectral and Color            Flow Doppler were utilized during procedure).                                 MODIFIED REPORT:    This report was modified by Cara JONETTA Lovelace MD on 10/20/2024 due  to small                              pericardial effusion.  Indications:     Pericardial Effusion  History:         Patient has no prior history of Echocardiogram examinations.                  CKD.  Sonographer:     Philomena Daring Referring Phys:  [id number] CORT ONEIDA MANA Diagnosing Phys: Cara JONETTA Lovelace MD  Sonographer Comments: Image acquisition challenging due to uncooperative patient. IMPRESSIONS  1. Left ventricular ejection fraction, by estimation, is 60 to 65%. The left ventricle has normal function. The left ventricle has no regional wall motion abnormalities. Left ventricular diastolic parameters are consistent with Grade I diastolic dysfunction (impaired relaxation).  2. Right ventricular systolic function is mildly reduced. The right ventricular size is mildly enlarged.  3. A small pericardial effusion is present. The pericardial effusion is anterior to the right ventricle, surrounding the apex and localized near the right atrium.  4. The mitral valve is normal in structure. No evidence of mitral valve regurgitation.  5. The aortic valve is normal in structure. Aortic valve regurgitation is trivial.  6. Aortic dilatation noted. There is mild dilatation of the ascending aorta, measuring 40 mm. FINDINGS  Left Ventricle: Left ventricular ejection fraction, by estimation, is 60 to 65%. The left ventricle has normal function. The left ventricle has no regional wall motion abnormalities. Strain was performed and the global longitudinal strain is  indeterminate. The left ventricular internal cavity size was normal in size. There is borderline left ventricular hypertrophy. Left ventricular diastolic parameters are consistent with Grade I diastolic dysfunction (impaired relaxation). Right Ventricle: The right ventricular size is mildly enlarged. No increase in right ventricular wall thickness. Right ventricular systolic function is mildly reduced. Left Atrium: Left atrial size was normal in size. Right Atrium: Right atrial size was normal in size. Pericardium: A small pericardial effusion is present. The pericardial effusion is anterior to the right ventricle, surrounding the apex and localized near the right atrium. Mitral Valve: The mitral valve is normal in structure. No evidence of mitral valve regurgitation. Tricuspid Valve: The tricuspid valve is normal in structure. Tricuspid valve regurgitation is trivial. Aortic Valve: The aortic valve is normal in structure. Aortic valve regurgitation is trivial. Aortic valve mean gradient measures 7.0 mmHg. Aortic valve peak gradient measures 13.4 mmHg. Aortic valve area, by VTI measures 1.97 cm. Pulmonic Valve: The pulmonic valve was normal in structure. Pulmonic valve regurgitation is not visualized. Aorta: Aortic dilatation noted. There is mild dilatation of the ascending aorta, measuring 40 mm. IAS/Shunts: No atrial level shunt detected by color flow Doppler. Additional Comments: 3D was performed not requiring image post processing on an independent workstation and was indeterminate.  LEFT VENTRICLE PLAX 2D LVIDd:         3.70 cm   Diastology LVIDs:         2.40 cm   LV e' medial:    6.31 cm/s LV PW:         1.00 cm   LV E/e' medial:  8.9 LV IVS:        1.00 cm   LV e' lateral:   8.05 cm/s LVOT diam:     2.00 cm   LV E/e' lateral: 7.0 LV SV:         62 LV SV Index:  38 LVOT Area:     3.14 cm  IVC IVC diam: 1.50 cm LEFT ATRIUM             Index        RIGHT ATRIUM           Index LA diam:        2.70 cm 1.67  cm/m   RA Area:     11.50 cm LA Vol (A2C):   34.7 ml 21.42 ml/m  RA Volume:   23.20 ml  14.32 ml/m LA Vol (A4C):   55.1 ml 34.01 ml/m LA Biplane Vol: 46.1 ml 28.46 ml/m  AORTIC VALVE AV Area (Vmax):    1.96 cm AV Area (Vmean):   1.93 cm AV Area (VTI):     1.97 cm AV Vmax:           183.00 cm/s AV Vmean:          120.000 cm/s AV VTI:            0.314 m AV Peak Grad:      13.4 mmHg AV Mean Grad:      7.0 mmHg LVOT Vmax:         114.00 cm/s LVOT Vmean:        73.900 cm/s LVOT VTI:          0.197 m LVOT/AV VTI ratio: 0.63  AORTA Ao Root diam: 3.10 cm MITRAL VALVE               TRICUSPID VALVE MV Area (PHT): 3.43 cm    TR Peak grad:   24.0 mmHg MV Decel Time: 221 msec    TR Vmax:        245.00 cm/s MV E velocity: 56.20 cm/s MV A velocity: 98.50 cm/s  SHUNTS MV E/A ratio:  0.57        Systemic VTI:  0.20 m                            Systemic Diam: 2.00 cm Cara JONETTA Lovelace MD Electronically signed by Cara JONETTA Lovelace MD Signature Date/Time: 10/20/2024/2:58:54 PM    Final (Updated)    CT Cervical Spine Wo Contrast Addendum Date: 10/20/2024 ADDENDUM: Study discussed by telephone with Doctor Clarine at 13:12 on October 20, 2024. ---------------------------------------------------- Electronically signed by: Helayne Hurst MD MD 10/20/2024 01:23 PM EST RP Workstation: [id number]   Result Date: 10/20/2024 EXAM: CT CERVICAL SPINE WITHOUT CONTRAST 10/20/2024 11:14:49 AM TECHNIQUE: CT of the cervical spine was performed without the administration of intravenous contrast. Multiplanar reformatted images are provided for review. Automated exposure control, iterative reconstruction, and/or weight based adjustment of the mA/kV was utilized to reduce the radiation dose to as low as reasonably achievable. COMPARISON: Head CT 10/20/2024, reported separately. Cervical spine CT 07/28/2024. CT chest 10/20/2024, reported separately. CLINICAL HISTORY: 84 year old female with recurrent falls, found down. FINDINGS: BONES AND ALIGNMENT:  Stable chronic straightening of cervical lordosis. No acute traumatic malalignment. No acute vertebral fracture is identified in the cervical spine. T3 superior endplate compression fracture is new since October, age indeterminate. This represents 22% loss of vertebral body height. No retropulsion. Posterior elements of T3 appear intact and aligned. More subtle compression of the T2 superior endplate also appears new since October. Posterior elements of T2 appear intact and aligned. DEGENERATIVE CHANGES: Mild for age cervical spine degeneration appears stable. Ordinary cervical carotid calcified atherosclerosis. SOFT TISSUES: Positive for mild new prevertebral edema (series  4 image 42) most pronounced from the C2-C3 through the right C3-C4 levels. Otherwise negative visible non-contrast neck soft tissues. No CT evidence of cervical spinal canal hemorrhage. IMPRESSION: 1. Positive for mild prevertebral edema from C2 to C4, compatible with acute anterior ligamentous injury in this setting. No superimposed acute cervical spine fracture. 2. New since October and age indeterminant T3 (22% loss of vertebral height) and subtle T2 superior endplate compression fractures. No retropulsion or complicating features. Electronically signed by: Helayne Hurst MD MD 10/20/2024 01:06 PM EST RP Workstation: [id number]   CT CHEST ABDOMEN PELVIS WO CONTRAST Result Date: 10/20/2024 EXAM: CT CHEST, ABDOMEN AND PELVIS WITHOUT CONTRAST 10/20/2024 11:14:49 AM TECHNIQUE: CT of the chest, abdomen and pelvis was performed without the administration of intravenous contrast. Multiplanar reformatted images are provided for review. Automated exposure control, iterative reconstruction, and/or weight based adjustment of the mA/kV was utilized to reduce the radiation dose to as low as reasonably achievable. COMPARISON: 01/19/2020 CLINICAL HISTORY: Recurrent falls, weakness, abdominal pain --evaluate for traumatic injury, underlying infection or source  of pain. FINDINGS: CHEST: MEDIASTINUM AND LYMPH NODES: Moderate cardiomegaly. Dense multivessel coronary atherosclerosis. Moderate volume pericardial effusion. The central airways are clear. No mediastinal, hilar or axillary lymphadenopathy. LUNGS AND PLEURA: Small bilateral pleural effusions with bibasilar compressive atelectasis. No focal consolidation or pulmonary edema. No pneumothorax. ABDOMEN AND PELVIS: LIVER: Unremarkable. GALLBLADDER AND BILE DUCTS: Unremarkable. No biliary ductal dilatation. SPLEEN: No acute abnormality. PANCREAS: Pancreatic parenchymal atrophy. ADRENAL GLANDS: No acute abnormality. KIDNEYS, URETERS AND BLADDER: Unchanged appearance of bilateral renal cysts, a couple of which are proteinaceous or hemorrhagic. Diffuse renal cortical atrophy on the left, slightly worse in the interim. No stones in the kidneys or ureters. No hydronephrosis. No perinephric or periureteral stranding. Urinary bladder is unremarkable. GI AND BOWEL: Stomach demonstrates no acute abnormality. There is no bowel obstruction. There is a larger left femoral hernia containing a couple of segments of small bowel. REPRODUCTIVE ORGANS: Hysterectomy. No acute abnormality. PERITONEUM AND RETROPERITONEUM: No ascites. No free air. VASCULATURE: Aorta is normal in caliber. Aortoiliac atherosclerosis. ABDOMINAL AND PELVIS LYMPH NODES: No lymphadenopathy. BONES AND SOFT TISSUES: Nondisplaced buckle type fractures of the anterior right 4 to 6 ribs. Mildly displaced fracture of the left lateral 11th rib. A couple of remote left sided rib fractures noted. Exaggerated thoracic kyphosis. Unchanged mild chronic compression fracture of the mid to upper thoracic spine. Multilevel thoracic laminectomy changes. Multilevel degenerative disc disease. Osteopenia. Small fat containing right femoral hernia. No focal soft tissue abnormality. IMPRESSION: 1. Nondisplaced buckle type fractures of the anterior right 4 to 6 ribs and mildly displaced  fracture of the left lateral 11th rib. 2. Small bilateral pleural effusions with bibasilar compressive atelectasis. No pneumothorax. 3. Left femoral hernia containing a couple of segments of small bowel. No findings of vascular compromise or upstream bowel obstruction. 4. Moderate cardiomegaly and moderate volume pericardial effusion. Electronically signed by: Rogelia Myers MD MD 10/20/2024 12:27 PM EST RP Workstation: [id number]   CT HEAD WO CONTRAST ( ) Result Date: 10/20/2024 EXAM: CT HEAD WITHOUT CONTRAST 10/20/2024 11:14:49 AM TECHNIQUE: CT of the head was performed without the administration of intravenous contrast. Automated exposure control, iterative reconstruction, and/or weight based adjustment of the mA/kV was utilized to reduce the radiation dose to as low as reasonably achievable. COMPARISON: Brain MRI 06/22/2018, Head CT 10/16/2024. CLINICAL HISTORY: 84 year old female. Recurrent falls, found down. FINDINGS: BRAIN AND VENTRICLES: No acute hemorrhage. No evidence of acute infarct. No hydrocephalus. No extra-axial collection. No mass effect  or midline shift. Mega Cisterna Magna, normal variant. Stable cerebral volume. Small but circumscribed chronic appearing lacunar infarct in the right thalamus is new from the previous MRI but stable recently (coronal image 35). Elsewhere gray white differentiation is within normal limits for age. Faint Basal Ganglia vascular calcifications. No suspicious intracranial vascular hyperdensity. ORBITS: No acute abnormality. SINUSES: Minor paranasal sinus mucosal thickening, significant stoutful. Tympanic cavities and mastoids remain well aerated. SOFT TISSUES AND SKULL: Mild right vertex scalp soft tissue swelling on series 4 image 71. Calcified atherosclerosis at the skull base. No skull fracture. IMPRESSION: 1. Right vertex mild scalp soft tissue injury. 2. No acute intracranial abnormality identified. Small chronic right thalamic lacunar infarct. Electronically  signed by: Helayne Hurst MD MD 10/20/2024 11:42 AM EST RP Workstation: [id number]   DG Chest Portable 1 View Result Date: 10/20/2024 EXAM: 1 VIEW(S) XRAY OF THE CHEST 10/20/2024 10:34:00 AM COMPARISON: 09/29/2024 CLINICAL HISTORY: trauma FINDINGS: LINES, TUBES AND DEVICES: Multiple wires and leads project over the chest on the frontal radiograph. LUNGS AND PLEURA: Overlying pleural thickening or trace fluid associated with suspected right rib fractures. Moderate left hemidiaphragm elevation. Left greater than right base subsegmental atelectasis and or scar. No pneumothorax. HEART AND MEDIASTINUM: Moderate cardiomegaly. No congestive heart failure. BONES AND SOFT TISSUES: Left shoulder arthroplasty. Suspect lateral mid right rib fractures. IMPRESSION: 1. Suspicion of right lateral rib fractures with overlying pleural thickening or fluid. CT is pending. 2. Cardiomegaly without congestive heart failure. Electronically signed by: Rockey Kilts MD MD 10/20/2024 11:20 AM EST RP Workstation: [id number]   DG Hip Unilat W or Wo Pelvis 2-3 Views Left Result Date: 10/20/2024 EXAM: 2 or more VIEW(S) XRAY OF THE PELVIS AND LEFT HIP 10/20/2024 10:34:00 AM COMPARISON: None available. CLINICAL HISTORY: trauma FINDINGS: BONES AND JOINTS: SI joints are symmetric. No acute fracture. Bilateral hips demonstrate normal alignment. LUMBAR SPINE: Degenerative changes of the lower lumbar spine. SOFT TISSUES: Calcifications overlying the pelvis consistent with phleboliths. IMPRESSION: 1. No evidence of acute traumatic injury. Electronically signed by: Rockey Kilts MD MD 10/20/2024 11:17 AM EST RP Workstation: [id number]   CT HEAD WO CONTRAST ( ) Result Date: 10/16/2024 EXAM: CT HEAD WITHOUT 10/16/2024 03:22:25 PM TECHNIQUE: CT of the head was performed without the administration of intravenous contrast. Automated exposure control, iterative reconstruction, and/or weight based adjustment of the mA/kV was utilized to reduce the radiation  dose to as low as reasonably achievable. COMPARISON: CT head 09/29/2024. CLINICAL HISTORY: FINDINGS: BRAIN AND VENTRICLES: No acute intracranial hemorrhage. No mass effect or midline shift. No extra-axial fluid collection. No evidence of acute infarct. No hydrocephalus. Similar mild for age chronic microvascular ischemic change. Similar chronic prominent retrocerebellar CSF. ORBITS: No acute abnormality. SINUSES AND MASTOIDS: No acute abnormality. SOFT TISSUES AND SKULL: No acute skull fracture. No acute soft tissue abnormality. IMPRESSION: 1. No acute intracranial abnormality. Electronically signed by: Gilmore Molt 10/16/2024 03:46 PM EST RP Workstation: [id number]   Microbiology: Results for orders placed or performed during the hospital encounter of 09/29/24  Resp panel by RT-PCR (RSV, Flu A&B, Covid) Anterior Nasal Swab     Status: None   Collection Time: 09/29/24  2:01 PM   Specimen: Anterior Nasal Swab  Result Value Ref Range Status   SARS Coronavirus 2 by RT PCR NEGATIVE NEGATIVE Final    Comment: (NOTE) SARS-CoV-2 target nucleic acids are NOT DETECTED.  The SARS-CoV-2 RNA is generally detectable in upper respiratory specimens during the acute phase of infection. The lowest concentration of SARS-CoV-2 viral copies this  assay can detect is 138 copies/mL. A negative result does not preclude SARS-Cov-2 infection and should not be used as the sole basis for treatment or other patient management decisions. A negative result may occur with  improper specimen collection/handling, submission of specimen other than nasopharyngeal swab, presence of viral mutation(s) within the areas targeted by this assay, and inadequate number of viral copies(<138 copies/mL). A negative result must be combined with clinical observations, patient history, and epidemiological information. The expected result is Negative.  Fact Sheet for Patients:  bloggercourse.com  Fact Sheet for  Healthcare Providers:  seriousbroker.it  This test is no t yet approved or cleared by the [place]  FDA and  has been authorized for detection and/or diagnosis of SARS-CoV-2 by FDA under an Emergency Use Authorization (EUA). This EUA will remain  in effect (meaning this test can be used) for the duration of the COVID-19 declaration under Section 564(b)(1) of the Act, 21 U.S.C.section 360bbb-3(b)(1), unless the authorization is terminated  or revoked sooner.       Influenza A by PCR NEGATIVE NEGATIVE Final   Influenza B by PCR NEGATIVE NEGATIVE Final    Comment: (NOTE) The Xpert Xpress SARS-CoV-2/FLU/RSV plus assay is intended as an aid in the diagnosis of influenza from Nasopharyngeal swab specimens and should not be used as a sole basis for treatment. Nasal washings and aspirates are unacceptable for Xpert Xpress SARS-CoV-2/FLU/RSV testing.  Fact Sheet for Patients: bloggercourse.com  Fact Sheet for Healthcare Providers: seriousbroker.it  This test is not yet approved or cleared by the [place]  FDA and has been authorized for detection and/or diagnosis of SARS-CoV-2 by FDA under an Emergency Use Authorization (EUA). This EUA will remain in effect (meaning this test can be used) for the duration of the COVID-19 declaration under Section 564(b)(1) of the Act, 21 U.S.C. section 360bbb-3(b)(1), unless the authorization is terminated or revoked.     Resp Syncytial Virus by PCR NEGATIVE NEGATIVE Final    Comment: (NOTE) Fact Sheet for Patients: bloggercourse.com  Fact Sheet for Healthcare Providers: seriousbroker.it  This test is not yet approved or cleared by the [place]  FDA and has been authorized for detection and/or diagnosis of SARS-CoV-2 by FDA under an Emergency Use Authorization (EUA). This EUA will remain in effect (meaning this  test can be used) for the duration of the COVID-19 declaration under Section 564(b)(1) of the Act, 21 U.S.C. section 360bbb-3(b)(1), unless the authorization is terminated or revoked.  Performed at [facility], [address], [place], [place] [address]   Urine Culture     Status: Abnormal   Collection Time: 09/29/24  2:01 PM   Specimen: Urine, Clean Catch  Result Value Ref Range Status   Specimen Description   Final    URINE, CLEAN CATCH Performed at [facility], [address] [place], [place] [address]    Special Requests   Final    NONE Performed at [facility], [address] [place], [place] [address]    Culture 40,000 COLONIES/mL PSEUDOMONAS AERUGINOSA (A)  Final   Report Status 10/02/2024 FINAL  Final   Organism ID, Bacteria PSEUDOMONAS AERUGINOSA (A)  Final      Susceptibility   Pseudomonas aeruginosa - MIC*    MEROPENEM  <=0.25 SENSITIVE Sensitive     CIPROFLOXACIN 0.12 SENSITIVE Sensitive     IMIPENEM 2 SENSITIVE Sensitive     CEFEPIME 4 SENSITIVE Sensitive     CEFTAZIDIME /AVIBACTAM 2 SENSITIVE Sensitive     CEFTOLOZANE/TAZOBACTAM 1 SENSITIVE Sensitive  TOBRAMYCIN <=1 SENSITIVE Sensitive     CEFTAZIDIME  2 SENSITIVE Sensitive     * 40,000 COLONIES/mL PSEUDOMONAS AERUGINOSA    Labs: CBC: No results for input(s): WBC, NEUTROABS, HGB, HCT, MCV, PLT in the last 168 hours. Basic Metabolic Panel: No results for input(s): NA, K, CL, CO2, GLUCOSE, BUN, CREATININE, CALCIUM , MG, PHOS in the last 168 hours. Liver Function Tests: No results for input(s): AST, ALT, ALKPHOS, BILITOT, PROT, ALBUMIN in the last 168 hours. CBG: No results for input(s): GLUCAP in the last 168 hours.  Discharge time spent: 35 minutes.  Signed: Murvin Mana, MD [facility] 11/01/2024 "

## 2024-11-01 NOTE — TOC Transition Note (Signed)
 Transition of Care [facility]) - Discharge Note   Patient Details  Name: Laura Bishop MRN: [id number] Date of Birth: [birth year]  [facility] CM/SW Contact:  Nathanael CHRISTELLA Ring, RN Phone Number: 11/01/2024, 11:49 AM   Clinical Narrative:    Patient will discharge to [facility] today going to room 1A.  Bedside nurse will call report to [phone].  [facility] has been arranged, she is 7th in line so it will not be until this afternoon or early evening. Daughter [name] Fortis and notified of discharge today, she verbalizes understanding and has no questions or concerns.    Final next level of care: Skilled Nursing Facility Barriers to Discharge: Barriers Resolved   Patient Goals and CMS Choice            Discharge Placement              Patient chooses bed at: [facility] Patient to be transferred to facility by: [facility] Name of family member notified: [name]- daughter Patient and family notified of of transfer: 11/01/24  Discharge Plan and Services Additional resources added to the After Visit Summary for                                       Social Drivers of Health (SDOH) Interventions SDOH Screenings   Food Insecurity: Patient Unable To Answer (10/24/2024)  Housing: Patient Unable To Answer (10/24/2024)  Transportation Needs: Patient Unable To Answer (10/24/2024)  Utilities: Patient Unable To Answer (10/24/2024)  Financial Resource Strain: Low Risk  (10/16/2024)   Received from [facility]  Social Connections: Patient Unable To Answer (10/24/2024)  Tobacco Use: Low Risk (10/20/2024)  Recent Concern: Tobacco Use - Medium Risk (10/18/2024)   Received from [facility]     Readmission Risk Interventions     No data to display
